# Patient Record
Sex: Female | Born: 1960 | Race: White | Hispanic: No | Smoking: Current every day smoker
Health system: Southern US, Community
[De-identification: ages and names within clinical notes are randomized; demographics above are authoritative.]

## PROBLEM LIST (undated history)

## (undated) ENCOUNTER — Ambulatory Visit: Admission: EM | Source: Home / Self Care

## (undated) DIAGNOSIS — M069 Rheumatoid arthritis, unspecified: Secondary | ICD-10-CM

## (undated) DIAGNOSIS — F319 Bipolar disorder, unspecified: Secondary | ICD-10-CM

## (undated) DIAGNOSIS — G8929 Other chronic pain: Secondary | ICD-10-CM

## (undated) DIAGNOSIS — E039 Hypothyroidism, unspecified: Secondary | ICD-10-CM

## (undated) DIAGNOSIS — M545 Low back pain, unspecified: Secondary | ICD-10-CM

## (undated) DIAGNOSIS — M797 Fibromyalgia: Secondary | ICD-10-CM

## (undated) DIAGNOSIS — T7840XA Allergy, unspecified, initial encounter: Secondary | ICD-10-CM

## (undated) DIAGNOSIS — K219 Gastro-esophageal reflux disease without esophagitis: Secondary | ICD-10-CM

## (undated) DIAGNOSIS — E785 Hyperlipidemia, unspecified: Secondary | ICD-10-CM

## (undated) DIAGNOSIS — K602 Anal fissure, unspecified: Secondary | ICD-10-CM

## (undated) DIAGNOSIS — D649 Anemia, unspecified: Secondary | ICD-10-CM

## (undated) DIAGNOSIS — S62109A Fracture of unspecified carpal bone, unspecified wrist, initial encounter for closed fracture: Secondary | ICD-10-CM

## (undated) DIAGNOSIS — F1910 Other psychoactive substance abuse, uncomplicated: Secondary | ICD-10-CM

## (undated) DIAGNOSIS — I309 Acute pericarditis, unspecified: Secondary | ICD-10-CM

## (undated) DIAGNOSIS — F419 Anxiety disorder, unspecified: Secondary | ICD-10-CM

## (undated) HISTORY — PX: BACK SURGERY: SHX140

## (undated) HISTORY — DX: Other psychoactive substance abuse, uncomplicated: F19.10

## (undated) HISTORY — DX: Anxiety disorder, unspecified: F41.9

## (undated) HISTORY — PX: TOTAL SHOULDER ARTHROPLASTY: SHX126

## (undated) HISTORY — DX: Gastro-esophageal reflux disease without esophagitis: K21.9

## (undated) HISTORY — DX: Hyperlipidemia, unspecified: E78.5

## (undated) HISTORY — DX: Anal fissure, unspecified: K60.2

## (undated) HISTORY — DX: Anemia, unspecified: D64.9

## (undated) HISTORY — DX: Other chronic pain: G89.29

## (undated) HISTORY — DX: Bipolar disorder, unspecified: F31.9

## (undated) HISTORY — PX: SHOULDER ARTHROSCOPY W/ ROTATOR CUFF REPAIR: SHX2400

## (undated) HISTORY — PX: COLONOSCOPY: SHX174

## (undated) HISTORY — DX: Hypothyroidism, unspecified: E03.9

## (undated) HISTORY — DX: Allergy, unspecified, initial encounter: T78.40XA

## (undated) HISTORY — PX: TONSILLECTOMY: SUR1361

## (undated) HISTORY — DX: Rheumatoid arthritis, unspecified: M06.9

---

## 1997-11-09 ENCOUNTER — Inpatient Hospital Stay (HOSPITAL_COMMUNITY): Admission: AD | Admit: 1997-11-09 | Discharge: 1997-11-09 | Payer: Self-pay | Admitting: Obstetrics and Gynecology

## 1998-02-05 ENCOUNTER — Ambulatory Visit (HOSPITAL_COMMUNITY): Admission: RE | Admit: 1998-02-05 | Discharge: 1998-02-05 | Payer: Self-pay | Admitting: Obstetrics and Gynecology

## 1998-03-05 ENCOUNTER — Observation Stay (HOSPITAL_COMMUNITY): Admission: RE | Admit: 1998-03-05 | Discharge: 1998-03-06 | Payer: Self-pay | Admitting: Obstetrics and Gynecology

## 1998-03-12 ENCOUNTER — Inpatient Hospital Stay (HOSPITAL_COMMUNITY): Admission: AD | Admit: 1998-03-12 | Discharge: 1998-03-12 | Payer: Self-pay | Admitting: Obstetrics and Gynecology

## 1998-03-19 ENCOUNTER — Ambulatory Visit (HOSPITAL_COMMUNITY): Admission: RE | Admit: 1998-03-19 | Discharge: 1998-03-19 | Payer: Self-pay

## 1998-04-14 ENCOUNTER — Ambulatory Visit (HOSPITAL_COMMUNITY): Admission: RE | Admit: 1998-04-14 | Discharge: 1998-04-14 | Payer: Self-pay | Admitting: Obstetrics and Gynecology

## 1998-04-28 ENCOUNTER — Inpatient Hospital Stay (HOSPITAL_COMMUNITY): Admission: AD | Admit: 1998-04-28 | Discharge: 1998-04-28 | Payer: Self-pay | Admitting: Obstetrics and Gynecology

## 1998-05-05 ENCOUNTER — Inpatient Hospital Stay (HOSPITAL_COMMUNITY): Admission: AD | Admit: 1998-05-05 | Discharge: 1998-05-05 | Payer: Self-pay | Admitting: Obstetrics and Gynecology

## 1998-05-06 ENCOUNTER — Inpatient Hospital Stay (HOSPITAL_COMMUNITY): Admission: AD | Admit: 1998-05-06 | Discharge: 1998-05-08 | Payer: Self-pay | Admitting: Obstetrics and Gynecology

## 1998-05-06 ENCOUNTER — Encounter: Payer: Self-pay | Admitting: Obstetrics and Gynecology

## 1998-05-09 ENCOUNTER — Inpatient Hospital Stay (HOSPITAL_COMMUNITY): Admission: AD | Admit: 1998-05-09 | Discharge: 1998-05-14 | Payer: Self-pay | Admitting: Obstetrics and Gynecology

## 1998-05-10 ENCOUNTER — Encounter: Payer: Self-pay | Admitting: Obstetrics and Gynecology

## 1999-05-18 HISTORY — PX: GASTRIC BYPASS: SHX52

## 1999-07-02 ENCOUNTER — Other Ambulatory Visit: Admission: RE | Admit: 1999-07-02 | Discharge: 1999-07-02 | Payer: Self-pay | Admitting: Obstetrics and Gynecology

## 1999-07-03 ENCOUNTER — Other Ambulatory Visit: Admission: RE | Admit: 1999-07-03 | Discharge: 1999-07-03 | Payer: Self-pay | Admitting: Obstetrics and Gynecology

## 1999-07-03 ENCOUNTER — Encounter (INDEPENDENT_AMBULATORY_CARE_PROVIDER_SITE_OTHER): Payer: Self-pay

## 2000-11-04 ENCOUNTER — Encounter: Payer: Self-pay | Admitting: Emergency Medicine

## 2000-11-04 ENCOUNTER — Emergency Department (HOSPITAL_COMMUNITY): Admission: EM | Admit: 2000-11-04 | Discharge: 2000-11-04 | Payer: Self-pay | Admitting: Emergency Medicine

## 2001-05-02 ENCOUNTER — Encounter: Admission: RE | Admit: 2001-05-02 | Discharge: 2001-06-01 | Payer: Self-pay | Admitting: Family Medicine

## 2002-01-25 ENCOUNTER — Encounter: Payer: Self-pay | Admitting: Emergency Medicine

## 2002-01-25 ENCOUNTER — Emergency Department (HOSPITAL_COMMUNITY): Admission: EM | Admit: 2002-01-25 | Discharge: 2002-01-25 | Payer: Self-pay | Admitting: Emergency Medicine

## 2002-04-30 ENCOUNTER — Emergency Department (HOSPITAL_COMMUNITY): Admission: EM | Admit: 2002-04-30 | Discharge: 2002-04-30 | Payer: Self-pay | Admitting: Emergency Medicine

## 2002-04-30 ENCOUNTER — Encounter: Payer: Self-pay | Admitting: Emergency Medicine

## 2002-05-09 ENCOUNTER — Encounter (INDEPENDENT_AMBULATORY_CARE_PROVIDER_SITE_OTHER): Payer: Self-pay | Admitting: Specialist

## 2002-05-09 ENCOUNTER — Ambulatory Visit (HOSPITAL_COMMUNITY): Admission: RE | Admit: 2002-05-09 | Discharge: 2002-05-09 | Payer: Self-pay | Admitting: *Deleted

## 2002-05-21 ENCOUNTER — Ambulatory Visit (HOSPITAL_COMMUNITY): Admission: RE | Admit: 2002-05-21 | Discharge: 2002-05-21 | Payer: Self-pay | Admitting: *Deleted

## 2002-05-21 ENCOUNTER — Encounter: Payer: Self-pay | Admitting: *Deleted

## 2002-09-06 ENCOUNTER — Encounter: Payer: Self-pay | Admitting: Emergency Medicine

## 2002-09-06 ENCOUNTER — Ambulatory Visit (HOSPITAL_COMMUNITY): Admission: EM | Admit: 2002-09-06 | Discharge: 2002-09-07 | Payer: Self-pay | Admitting: Emergency Medicine

## 2004-02-24 ENCOUNTER — Encounter: Admission: RE | Admit: 2004-02-24 | Discharge: 2004-02-24 | Payer: Self-pay | Admitting: Internal Medicine

## 2004-03-17 HISTORY — PX: ABDOMINAL HYSTERECTOMY: SHX81

## 2004-03-24 ENCOUNTER — Encounter (INDEPENDENT_AMBULATORY_CARE_PROVIDER_SITE_OTHER): Payer: Self-pay | Admitting: *Deleted

## 2004-03-24 ENCOUNTER — Inpatient Hospital Stay (HOSPITAL_COMMUNITY): Admission: RE | Admit: 2004-03-24 | Discharge: 2004-03-25 | Payer: Self-pay | Admitting: Obstetrics and Gynecology

## 2004-11-20 ENCOUNTER — Emergency Department (HOSPITAL_COMMUNITY): Admission: EM | Admit: 2004-11-20 | Discharge: 2004-11-21 | Payer: Self-pay | Admitting: Emergency Medicine

## 2004-11-21 ENCOUNTER — Inpatient Hospital Stay (HOSPITAL_COMMUNITY): Admission: EM | Admit: 2004-11-21 | Discharge: 2004-11-24 | Payer: Self-pay | Admitting: Psychiatry

## 2004-11-21 ENCOUNTER — Ambulatory Visit: Payer: Self-pay | Admitting: Psychiatry

## 2006-12-04 ENCOUNTER — Emergency Department (HOSPITAL_COMMUNITY): Admission: EM | Admit: 2006-12-04 | Discharge: 2006-12-04 | Payer: Self-pay | Admitting: Emergency Medicine

## 2008-03-12 ENCOUNTER — Emergency Department (HOSPITAL_COMMUNITY): Admission: EM | Admit: 2008-03-12 | Discharge: 2008-03-12 | Payer: Self-pay | Admitting: Emergency Medicine

## 2008-08-01 ENCOUNTER — Emergency Department (HOSPITAL_COMMUNITY): Admission: EM | Admit: 2008-08-01 | Discharge: 2008-08-01 | Payer: Self-pay | Admitting: Emergency Medicine

## 2008-12-05 ENCOUNTER — Observation Stay (HOSPITAL_COMMUNITY): Admission: EM | Admit: 2008-12-05 | Discharge: 2008-12-06 | Payer: Self-pay | Admitting: Emergency Medicine

## 2008-12-11 ENCOUNTER — Other Ambulatory Visit (HOSPITAL_COMMUNITY): Admission: RE | Admit: 2008-12-11 | Discharge: 2009-01-09 | Payer: Self-pay | Admitting: Psychiatry

## 2008-12-12 ENCOUNTER — Ambulatory Visit: Payer: Self-pay | Admitting: Psychiatry

## 2009-05-01 ENCOUNTER — Encounter: Admission: RE | Admit: 2009-05-01 | Discharge: 2009-05-01 | Payer: Self-pay | Admitting: Obstetrics and Gynecology

## 2009-06-07 ENCOUNTER — Emergency Department (HOSPITAL_COMMUNITY): Admission: EM | Admit: 2009-06-07 | Discharge: 2009-06-07 | Payer: Self-pay | Admitting: Emergency Medicine

## 2010-06-07 ENCOUNTER — Encounter: Payer: Self-pay | Admitting: Sports Medicine

## 2010-06-07 ENCOUNTER — Encounter: Payer: Self-pay | Admitting: Family Medicine

## 2010-06-07 ENCOUNTER — Encounter: Payer: Self-pay | Admitting: Obstetrics and Gynecology

## 2010-08-22 LAB — OPIATE, QUANTITATIVE, URINE
Hydrocodone: 1690 ng/mL
Hydromorphone GC/MS Conf: NEGATIVE ng/mL
Morphine, Confirm: NEGATIVE ng/mL
Oxycodone, ur: NEGATIVE ng/mL
Oxymorphone: NEGATIVE ng/mL

## 2010-08-22 LAB — URINE DRUGS OF ABUSE SCREEN W ALC, ROUTINE (REF LAB)
Barbiturate Quant, Ur: NEGATIVE
Cocaine Metabolites: NEGATIVE
Ethyl Alcohol: <10 mg/dL (ref <10)
Methadone: NEGATIVE
Propoxyphene: NEGATIVE

## 2010-08-23 LAB — COMPREHENSIVE METABOLIC PANEL
AST: 30 U/L (ref 0–37)
Albumin: 3.9 g/dL (ref 3.5–5.2)
BUN: 14 mg/dL (ref 6–23)
Chloride: 105 mEq/L (ref 96–112)
Sodium: 140 mEq/L (ref 135–145)

## 2010-08-23 LAB — URINE CULTURE

## 2010-08-23 LAB — RAPID URINE DRUG SCREEN, HOSP PERFORMED
Amphetamines: NOT DETECTED
Benzodiazepines: POSITIVE — AB
Opiates: NOT DETECTED
Tetrahydrocannabinol: NOT DETECTED

## 2010-08-23 LAB — BASIC METABOLIC PANEL
Chloride: 108 mEq/L (ref 96–112)
GFR calc Af Amer: >60 mL/min (ref >60)
Glucose, Bld: 92 mg/dL (ref 70–99)
Potassium: 3.7 mEq/L (ref 3.5–5.1)
Sodium: 138 mEq/L (ref 135–145)

## 2010-08-23 LAB — CBC
Hemoglobin: 10.9 g/dL — ABNORMAL LOW (ref 12.0–15.0)
Platelets: 219 10*3/uL (ref 150–400)
RBC: 3.33 MIL/uL — ABNORMAL LOW (ref 3.87–5.11)
WBC: 6.4 10*3/uL (ref 4.0–10.5)

## 2010-08-23 LAB — ETHANOL: Alcohol, Ethyl (B): <5 mg/dL (ref 0–10)

## 2010-08-23 LAB — URINALYSIS, ROUTINE W REFLEX MICROSCOPIC
Glucose, UA: >1000 mg/dL — AB
Nitrite: NEGATIVE
pH: 5.5 (ref 5.0–8.0)

## 2010-08-23 LAB — ACETAMINOPHEN LEVEL: Acetaminophen (Tylenol), Serum: <10 ug/mL — ABNORMAL LOW (ref 10–30)

## 2010-08-23 LAB — URINE MICROSCOPIC-ADD ON

## 2010-09-29 NOTE — Discharge Summary (Signed)
[name], [name]                 ACCOUNT NO.:  [id number]   MEDICAL RECORD NO.:  [id number]          PATIENT TYPE:  OBV   LOCATION:  1443                         FACILITY:  [facility]   PHYSICIAN:  Zannie Cove, MD     DATE OF BIRTH:  Aug 14, 1960   DATE OF ADMISSION:  12/05/2008  DATE OF DISCHARGE:  12/06/2008                               DISCHARGE SUMMARY   PRIMARY CARE PHYSICIAN:  Pasty Spillers. Ivory Broad, MD at [facility].   DISCHARGE DIAGNOSIS:  Altered mental status secondary to drug overdose,  resolved.   CONSULTATIONS:  Psychiatry, Behavioral Health, Geoffery Lyons, M.D.,  pending.   SECONDARY DIAGNOSES:  1. Altered mental status secondary to drug overdose.  2. Depression.  3. Fibromyalgia.  4. Chronic pain syndrome.   DISCHARGE MEDICATIONS:  May be changed, but are as follows:  1. Lyrica 150 mg b.i.d.  2. Xanax 1 mg b.i.d.  3. Prozac 40 mg once a day.  4. Vicodin 5/325 p.o. t.i.d. p.r.n. for pain.   HOSPITAL COURSE:  For admission details, please see the note dictated by  Dr. Ardyth Harps on December 05, 2008.  Briefly, Ms. Beechy was admitted with  altered mental status secondary to multidrug overdose.  She was  monitored clinically and gradually her mental status improved as the  medication affects started wearing down.  She is currently stable, still  with a sitter at the bedside, awaiting a psych consultation.  The  patient will be discharged once cleared by psychiatry.   DISCHARGE DIET:  Regular.   DISCHARGE ACTIVITY:  As tolerated.   FOLLOW UP:  The patient is advised to follow up with primary care  physician, Dr. Ivory Broad in 1-2 weeks.      Zannie Cove, MD  Electronically Signed    PJ/MEDQ  D:  12/06/2008  T:  12/06/2008  Job:  [id number]   cc:   Nolon Nations, MD

## 2010-09-29 NOTE — Consult Note (Signed)
NAMEOSIE, MERKIN NO.:  [id number]   MEDICAL RECORD NO.:  [id number]          PATIENT TYPE:  REC   LOCATION:  CIOP                          FACILITY:  [facility]   PHYSICIAN:  [name], M.D.  DATE OF BIRTH:  1961-03-23   DATE OF CONSULTATION:  12/06/2008  DATE OF DISCHARGE:                                 CONSULTATION   REASON FOR CONSULTATION:  Anxiety, alcohol dependence.   HISTORY OF PRESENT ILLNESS:  [name] is a 50 year old female  admitted to the [facility] on December 05, 2008, due to an  overdose.   The patient denies having any memory of overdosing, and she has a  blackout for the time between she was sitting on her back porch the day  before the overdose and waking up in the hospital.  She was found not  breathing in her garage, and there were empty bottles present including  Xanax and Lyrica bottles.   The patient describes constructive future goals and interests.  She  denies any thoughts of harming herself or others.  She has no impairment  in orientation.  She is alert.  Her memory function is normal.  She is  cooperative.   She does acknowledge that she took extra medication that morning.  She  acknowledges that she took 2 mg of Xanax that morning instead of her  normal dosage of 1 mg.  She also acknowledges that she took two of her  Lyrica 150 mg tablets that morning instead of her normal single tablet.  She also acknowledges that she took two Vicodin tablets that morning  instead of her normal one Vicodin tablet.  She acknowledges that this is  medication abuse.  She states that she had some extra pain that morning  and deliberately took extra medicine to numb herself.  However, again,  she denies any suicidal thoughts.   PAST PSYCHIATRIC HISTORY:  She did have a psychiatric admission in 2006  for an overdose.  Most recently her normal prescription medication  includes Xanax 1 mg t.i.d. for anxiety and Prozac 40 mg daily  for  antidepression antianxiety.   Regarding her past substance abuse and dependence.  She does acknowledge  that she has been relapsing on alcohol drinking two beers every 3 days.  She also describes a history of heavy year drinking when she would drink  between applying in a fifth of liquor per day for a month at a time.   She has been to the [facility] and other chemical dependency residential  rehabilitation programs in the past.   She denies any history of racing thoughts, increased energy or decreased  need for sleep.  She does state that she has been on lithium in the past  as well as Risperdal, Lamictal and Seroquel.  She does describe a long-  term history of feeling on edge, excessive worry, muscle tension and  insomnia.   She has undergone treatment for alcohol withdrawal in the past in  addition to a residential rehabilitation.   In review of the past medical record regarding the  2006 overdose, her  presentation at that time was similar to the current history of  overdose.  The patient denies any history of suicide attempts.  During  the 2006 [facility] admission, she was treated with the  Librium protocol and diagnosed with polysubstance abuse induced  psychosis.  They listed rule out bipolar disorder.  Her discharge  medications at that time included Seroquel 100 mg q.h.s. Geodon 80 mg  b.i.d. and Paxil 20 mg daily   SOCIAL HISTORY:  [name] and did not complete high school.  She has a  54 year old daughter and a 38 year old son.  She cites them as reasons  why she would never commit suicide.  She has undergone divorce three  times.  She is currently living in a house with two of her children.  She denies any illegal drugs.   FAMILY PSYCHIATRIC HISTORY:  None known.   PAST MEDICAL HISTORY:  Status post polysubstance overdose history of  gastric bypass.   ALLERGIES:  No drug allergies.   MEDICATIONS:  MAR is reviewed.  She is not currently on any  psychotropic  medications.   LABORATORY DATA:  Sodium 138, BUN 5, creatinine 0.74, glucose 92.  WBC  6.4, hemoglobin 10.9, platelet count 219, alcohol was negative.  SGOT  30, SGPT 25, Tylenol negative.  Drug screen was positive for  benzodiazepines.   REVIEW OF SYSTEMS:  Constitutional, Head, Eyes, Ears, Nose And Throat,  Mouth, Neurologic, Psychiatric, Cardiovascular, Respiratory,  Gastrointestinal, Genitourinary, Skin, Musculoskeletal, Hematologic,  Lymphatic, Endocrine, Metabolic all unremarkable except for additional  social:  [name] father did not receive any information from the  undersigned regarding [name]'s case.  However, he did tell the  undersigned when he approached the nursing station about the several  rehabilitation programs that [name] had been through only to have  relapse again.   PHYSICAL EXAMINATION:  VITAL SIGNS:  Temperature 97.7, pulse 82,  respiratory rate 18, blood pressure 131/82, O2 saturation on room air  98%.  GENERAL APPEARANCE:  [name] is a middle-aged female appearing her  chronologic age sitting up in her hospital bed with no abnormal  involuntary movements.   MENTAL STATUS EXAM:  [name] is alert.  Her eye contact is good.  Her  attention span is normal.  Her affect is mildly anxious mood is mildly  anxious.  Concentration within normal limits.  She is oriented  completely to all spheres.  Her memory is intact for immediate recent  and remote except for the overdose blackout.  Her fund of knowledge and  intelligence are normal.  Her speech involves normal rate and prosody  without dysarthria.   Thought process logical, coherent, goal-directed.  No looseness of  associations.  Thought content:  No thoughts of harming herself or  others.  No delusions or hallucinations.  Insight is partial.  She does  understand that she has a severe problem with addiction.  However, she  is not motivated to attend additional intensive  inpatient  rehabilitation.  Her judgment is intact.   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  AXIS II:  Deferred.  AXIS III:  See past medical history.  AXIS IV:  Primary support group.  AXIS V:  55.   Ms. Febo is not at risk to harm herself or others.  She agrees to call  emergency services immediately for any thoughts of harming herself,  thoughts  of harming others or distress.   The undersigned recommended that she undergo inpatient chemical  dependence treatment.  This could include a sedative hypnotic detox  protocol such as an Ativan protocol.  She could benefit from a  residential programs involving relapse prevention skills as well as  additional coping skills and stress management.   However, the patient declines inpatient treatment and she is no longer  committable now that she is recovered from her intoxicated state.   She does want to continue her Prozac for antianxiety.   However, she understands that the undersigned does not advise continuing  Xanax given her history of severe abuse and risk of relapse.   She also understands the potential risk of withdrawal.   At this time, she would be willing to attend a chemical dependency  intensive outpatient program.   Would restart her Prozac and continue to recommend the inpatient  treatment as above.  However, as mentioned, she is not committable and  does have the capacity for informed consent.   She does have the capacity to leave the hospital against medical advice.      [name], M.D.  Electronically Signed     JW/MEDQ  D:  12/20/2008  T:  12/21/2008  Job:  [id number]

## 2010-09-29 NOTE — H&P (Signed)
NAMETONETTE, KOEHNE NO.:  [id number]   MEDICAL RECORD NO.:  [id number]          PATIENT TYPE:  EMS   LOCATION:  ED                           [facility]ACILITY[facility]  [facility]   PHYSICIAN:  Peggye Pitt, M.D. DATE OF BIRTH:  [birth year]   DATE OF ADMISSION:  12/05/2008  DATE OF DISCHARGE:                              HISTORY & PHYSICAL   The patient's primary care physician is Dr. Nelson Chimes in [place].   CHIEF COMPLAINT:  Drug overdose.   HISTORY OF PRESENT ILLNESS:  Mrs. Crumrine is a 50 year old Caucasian  female who apparently was found in the garage not breathing with a  positive pulse.  A man who lives at the house with her called EMS.  Apparently they bagged her and gave her Narcan and she responded. It  appears the gentleman who lives with her had oxycodone filled on December 02, 2008 that had about 15 pills left that is now empty.  EMS also found  a bottle of Xanax and Lyrica that were empty, and there is a question if  she may have overdosed on these as well.   Upon my examination the patient is very lethargic.  She opens her eyes  briefly. I ask for a question, and she falls right back asleep.  Hence I  am not able to obtain any history. I was able to get out of though that  she was not actually trying to kill herself.  Instead she was in a lot  of pain with her fibromyalgia and just wanted to feel better.   ALLERGIES:  She has no documented allergies.   PAST MEDICAL HISTORY:  Unobtainable at this time secondary to her  altered mental status.   MEDICATIONS:  I have not been able to obtain this from the patient  directly but from bottles brought in by EMS it would appear she is on  Lyrica 150 twice daily as well as Xanax 1 mg twice daily.   SOCIAL HISTORY:  Unobtainable as well as review of systems secondary to  her mental status.   REVIEW OF SYSTEMS:  Unobtainable.   PHYSICAL EXAM:  VITAL SIGNS:  Upon admission blood pressure 98/59, heart  rate 105, respirations  12, O2 sats 97% on room air with a temperature  97.5.  GENERAL:  She is very somnolent, opens her eyes to voice.  However,  falls quickly back to sleep. Her speech is very slurred.  HEENT: She is normocephalic, atraumatic.  Her pupils are equal and  reactive to light and accommodation.  I have been unable to evaluate  extraocular movements.  NECK:  Supple.  No JVD, no lymphadenopathy, no bruits or goiter.  HEART:  Regular rate and rhythm with no murmurs, rubs or gallops.  LUNGS: Clear to auscultation bilaterally.  ABDOMEN:  Soft, nontender, nondistended with positive bowel sounds.  EXTREMITIES:  She has no edema, positive pedal pulses.   LABS UPON ADMISSION:  Sodium 140, potassium 4.4, chloride 105, bicarb  26, BUN 14, creatinine 1.13 with a glucose of 161.  All of her LFTs are  within normal  limits.  Urine drug screen is only positive for  benzodiazepine. A urinalysis is negative. Alcohol and Tylenol levels are  negative.  She had a chest x-ray that shows cardiomegaly with mild  vascular congestion.   She had an EKG that showed normal sinus rhythm with no acute T  prolongation or ST changes.   ASSESSMENT AND PLAN:  1. A Multidrug overdose, including Xanax and possibly Lyrica.      Currently the patient is very lethargic.  Apparently she had a      respiratory arrest on the scene and required bagging. However, she      has not been intubated. She is satting well on room air.  For now      will admit to step-down unit for observation purposes,  suicide      precautions, as well as a sitter at bedside. I will obtain a      psychiatric consultation.  For prophylaxis while in the hospital      she will be on Protonix for GI prophylaxis and Lovenox for DVT      prophylaxis.      Peggye Pitt, M.D.  Electronically Signed     EH/MEDQ  D:  12/05/2008  T:  12/05/2008  Job:  [id number]   cc:   Nelson Chimes, MD  [facility]

## 2010-10-02 NOTE — Discharge Summary (Signed)
[name], Shelley NO.:  [id number]   MEDICAL RECORD NO.:  [id number]          PATIENT TYPE:  IPS   LOCATION:  0508                          FACILITY:  [facility]   PHYSICIAN:  Geoffery Lyons, M.D.      DATE OF BIRTH:  [birth year]   DATE OF ADMISSION:  11/21/2004  DATE OF DISCHARGE:  11/24/2004                                 DISCHARGE SUMMARY   CHIEF COMPLAINT AND PRESENT ILLNESS:  This was the first admission to [facility] for this 50 year old divorced white female  involuntarily committed. EMS was called to come to the patient's home the  night before. They stated that patient had back pain. Upon arrival, the  patient was noted to be lethargic. Her speech was slurred. The patient's  sister gave EMS a Xanax bottle with approximately 28.5 missing. She endorsed  having drunk alcohol the night before and taking Xanax during the day.  Claimed it was not a suicide attempt. She was lethargic, but she aroused to  voice. She is was cooperative. Followed commands. In the ER, she was noted  to be having some outbursts of nonsensical speech, and she was also holding  a conversation with an imaginary person. Treated with Geodon IM 20 with good  response.   PAST PSYCHIATRIC HISTORY:  [place] in [place]. Seven years and five years ago  was in [place] for alcohol dependency.   MEDICAL HISTORY:  Gastric bypass.   MEDICATIONS:  1.  Xanax 0.5 three times a day.  2.  Paxil 10 mg a day.   PHYSICAL EXAMINATION:  Physical exam performed but did not show any acute  findings.   LABORATORY DATA:  Blood chemistries:  TSH 6.162. SGOT 37, SGPT 20. Drug  screen positive for benzodiazepines. CBC:  White blood cells 10.2,  hemoglobin 14.6.   MENTAL STATUS EXAMINATION:  Reveals an alert, cooperative female, casually  groomed and dressed, adequately nourished. Speech was not pressured but at  times will not answer appropriately. Mood, she claims to be good. Affect was  hyperthymic. Thought processes were not clear or logical. She could be  rational or goal oriented for a short period of time. Judgment and insight  were poor. She denied any suicidal or homicidal ideations. Noted that she  had heard voices in the past.   ADMISSION DIAGNOSES:  AXIS I:  Rule out substance-induced psychosis. Alcohol  and benzodiazepine abuse. Rule out bipolar disorder.  AXIS II:  No diagnosis.  AXIS III:  Status post gastric bypass.  AXIS IV:  Moderate.  AXIS V:  Upon admission 25 to 30, highest Global Assessment of Functioning  in the last year 60.   COURSE IN THE HOSPITAL:  She was admitted and started in individual and  group psychotherapy. She was given Ambien for sleep, Seroquel 100 at night  and 50 every 6 hours as needed for anxiety or agitation. She was detoxified  with Librium. She required the Geodon 20 mg IM a one time dose, and then she  was placed on Paxil 20 mg per day. She endorsed that  she had been using  alcohol and Xanax. Endorsed persistent anxiety. She was on Paxil and wanting  to go back on it. Endorsed she was committed to abstinence. She already had  a bed to go to [facility] July 17. Initially admitted psychotic. As the  hospitalization progressed, she was in full contact with reality, a little  bit anxious and shaky. July 10, she was better, committed to abstinence, was  going to [facility]. Slept better the night before. Wanted to pursue  medications. Endorsed she had a lot of things to do. On July 11, she was in  full contact with reality. There were no suicidal ideas. No homicidal ideas.  No hallucinations. No delusions. Said she was ready to go. She was going to  go to [facility] July 17. Was committed to abstinence. She had some  arrangements to make before she was ready to go to [facility].   DISCHARGE DIAGNOSES:  AXIS I:  Alcohol dependence. Xanax and benzodiazepine  abuse. Substance-induced psychosis. Anxiety disorder  not otherwise  specified.  AXIS II:  No diagnosis.  AXIS III:  Status post gastric bypass.  AXIS IV:  Moderate.  AXIS V:  50.   DISCHARGE MEDICATIONS:  1.  Seroquel 100 at night.  2.  Geodon 80 one in the morning, one in the afternoon.  3.  Paxil 20 mg per day.   Recommendations to simplify her medication regimen, possibly starting with  the Seroquel, to go to [facility] on July 17.      Geoffery Lyons, M.D.  Electronically Signed     IL/MEDQ  D:  12/22/2004  T:  12/23/2004  Job:  [id number]

## 2010-10-02 NOTE — Discharge Summary (Signed)
NAME:  [name], [name]                           ACCOUNT NO.:  [id number]   MEDICAL RECORD NO.:  [id number]                   PATIENT TYPE:  INP   LOCATION:  3708                                 FACILITY:  [facility]   PHYSICIAN:  [name], M.D.         DATE OF BIRTH:  1961-01-23   DATE OF ADMISSION:  09/06/2002  DATE OF DISCHARGE:  09/07/2002                                 DISCHARGE SUMMARY   CHIEF COMPLAINT:  Palpitation, chest pressure, and later chest pain.   DISCHARGE DIAGNOSES:  1. New incident ventricular tachycardia per telemetry at fire station prior     to arrival to the emergency room.  Emergency room telemetry unremarkable.     Complaints of chest pain/pressure.     a. Normal cardiac enzymes x 3, normal magnesium, normal thyroid-        stimulating hormones.     b. Status post cardiology consult, Dr. Pearletha Martinez. [name], request no        in-hospital interventions.  Follow up with Dr. Pearletha Martinez. [name],        Sep 24, 2002 postdischarge.  Also Cardiolite/stress test postdischarge        at [facility] and V[facility] office Sep 21, 2002.     c. Placed on beta blocker and is tolerating at the point of discharge.     d. Lipid panel obtained and this was pending at the time of discharge.  2. Status post stomach bypass surgery, previous upper gastrointestinal     series and esophagogram indicated hiatal hernia and gastroesophageal     reflux disease.     a. We will place on proton pump inhibitor and follow up with Dr. Georgiana Martinez, gastroenterology p.r.n.     b. Previous upper endoscopy with biopsy, question Barrett's esophagus.        Per biopsy result and pathology, January 26, 2002, noted benign        gastroesophageal junction mucosa with positive inflammatory changes        but no metaplasia.  3. History of arthritis, questionable osteoarthritis followed by Dr. Lemmie Martinez, [facility].  No problems with this over  the     course of the hospitalization.  4. Anxiety disorder using Klonopin 0.5 mg on a p.r.n. basis.   PAST SURGICAL HISTORY:  Status post cesarean section x 2 and status post  tubal ligation.   ALLERGIES:  No known drug allergies.   DISCHARGE MEDICATIONS:  1. Protonix 40 mg 1 q.d.  2. Metoprolol 25 mg b.i.d.  3. Paxil 10  mg q.d.  4. Klonopin 0.5 mg t.i.d. p.r.n.   DIET:  Status post distant gastric bypass surgery six to eight small meals  daily.  Would reflux would avoid high acid foods.   FOLLOW UP:  1. Follow up with Dr. Pearletha Martinez. [name] Sep 24, 2002.  2. Cardiolite stress test at [facility] and V[facility] Sep 17, 2002.  3. Follow up with Dr. Teena Martinez. [name] at [facility] one to     two weeks postdischarge.   PROCEDURE:  None.   CONSULTATIONS:  [name], M.D., [facility] and V[facility].   DISCHARGE LABORATORY DATA:  Cardiac enzymes x 3 were unremarkable.  Serum  magnesium was 1.9 and serum TSH 1.393.  A metabolic panel September 06, 2002  showed a sodium of 141, potassium 3.8, chloride 109, CO2 27, BUN 11,  creatinine 0.8.  Albumin low at 3.3.  A CBC done on September 06, 2002 found WBC  8.8, hemoglobin 10.7, hematocrit 30.6, platelets 343,000.   A 12-lead EKG noted normal sinus rhythm.   HISTORY OF PRESENT ILLNESS:  Per admission history of present illness,  family history, social history, review of systems, admission medications,  laboratory data, impressions and plan, admission examination, please see  dictated admission history and physical from September 06, 2002.   HOSPITAL COURSE:  Problem 1:  NEW ONSET VENTRICULAR TACHYCARDIA:  This 50-  year-old female was followed as an outpatient at [facility]  by Dr. Teena Martinez. [name].  She was working at her desk and felt her heart  racing.  She felt some chest tightness.  She denied diaphoresis.  There was  no radiation to the left arm or jaw.  She waited approximately 10  minutes  and when the symptomatology did not relieve itself she went to a local fire  station.  She reported that she was placed on telemetry and found to have a  pulse of 240.  She was given Adenosine and sublingual nitroglycerin as well  as aspirin and sent to [facility] Emergency Room.   A 12-lead EKG in the emergency room indicated normal sinus rhythm.  She did  complain of some chest pain rated 3/10.  She has known history of stomach  bypass.  Further details below under problem #2 but essentially there is a  known hiatal hernia and reflux diagnosed previously by upper GI series and  esophagogram.   She was seen in cardiology consult by Dr. Pearletha Martinez. [name] of  [facility] and Vascular.  The patient has a known smoking continuing  to smoke approximately one-half pack daily.  There was a family history of  myocardial infarction in paternal grandparents at age 35 resulting in death.  Cardiac enzymes x 3 were obtained and these were all negative.  Serum TSH  and magnesium level were checked and these were normal.  Per cardiology  consult did not feel any in-hospital interventions were required.  She was  started on a beta blocker by cardiology and has tolerated this to the point  of discharge.  She is to follow up with Dr. Pearletha Martinez. [name]  postdischarge as above.  She will also have a Cardiolite stress test  postdischarge at [facility] and V[facility].   Problem 2:  STATUS POST STOMACH BYPASS SURGERY:  This was done approximately  three years ago in [place].  Late last year, the patient had some problems  with reflux/vomiting and a sensation of things becoming stuck in her  esophagus.  She was seen in GI consult by Dr. Georgiana Martinez.  He performed  an upper GI series and esophagogram both indicating a hiatal hernia and  GERD.  She had an upper endoscopy done by Dr. Melrose Martinez. [name] suggesting a possible Barrett's esophagus.  I followed up on the  biopsy pathology and  this does in fact not suggest a Barrett's.  She is placed on proton pump  inhibitor and this will continue postdischarge.  She was asked to continue  with her six to eight small meals daily.  I have asked her to avoid acid  foods such as citrus juice, etc.  Follow up with Dr. Melrose Martinez. [name] on a  p.r.n. basis postdischarge.   Problem 3:  HISTORY OF ARTHRITIS:  This was followed by Dr. Lemmie Martinez and was not a problem over the course of hospitalization.   Problem 4:  MODERATE ANEMIA:  Admitting hemoglobin was 10.7.  This was not  aggressively investigated.  No indication of acute GI bleed.  Most likely  thought secondary to menstruation.  Would suggest follow-up CBC at some  point postdischarge at [facility].   Problem 5:  ANXIETY DISORDER:  The patient does have a known  anxiety  disorder taking p.r.n. Klonopin.  This was thought as a possible contributor  to ventricular tachycardia as well as reflux/esophageal spasm.  She will  continue on her p.r.n. Klonopin postdischarge.   CONDITION ON DISCHARGE:  Stable/improved.   DISPOSITION:  Returning home where she lives independently.  Activity is no  restrictions.  Follow-up as above.  Discharge process greater than 30  minutes, 11:15 a.m. through 12:15 p.m.     Everett Graff, N.P.                     [name], M.D.    TC/MEDQ  D:  09/07/2002  T:  09/09/2002  Job:  [id number]   cc:   [name]. [name], M.D.  [address]  [place]  [place] [address]  Fax: [phone]   [name], M.D.  [phone], [address]  [place]  [place] [address]  Fax: [phone]

## 2010-10-02 NOTE — Op Note (Signed)
   NAME:  [name], [name]                           ACCOUNT NO.:  [id number]   MEDICAL RECORD NO.:  [id number]                   PATIENT TYPE:  AMB   LOCATION:  ENDO                                 FACILITY:  [facility]   PHYSICIAN:  Georgiana Spinner, M.D.                 DATE OF BIRTH:  1960-10-19   DATE OF PROCEDURE:  DATE OF DISCHARGE:                                 OPERATIVE REPORT   PROCEDURE:  Upper endoscopy with biopsy.   INDICATIONS:  Vomiting.   ANESTHESIA:  Demerol 80, Versed 8 mg.   DESCRIPTION OF PROCEDURE:  With the patient mildly sedated in the left  lateral decubitus position, the Olympus videoscopic endoscope was inserted  into the mouth and passed under direct vision through the esophagus which  appeared normal except for a questionable area of Barrett's which was  photographed and biopsied.  We entered into the stomach sac, and this  appeared very healthy.  To the right, the remainder of the stomach had been  sewn off and could not be entered.  The opening to the bowel, however, the  gastrotomy site, was widely patent and healthy appearing.  We advanced  through this easily into a saccular structure of the bowel which appeared  normal as well, and the  remainder of the bowel was easily entered and was  normal appearing.  The endoscope was then withdrawn taking circumferential  views of the small bowel and gastric mucosa until the endoscope had been  pulled back all the way.  The patient's vital signs and pulse oximeter  remained stable.  The patient tolerated the procedure well with no apparent  complications.   FINDINGS:  Widely patent examination.   PLAN:  Will get barium swallow and upper GI to evaluate further and have  patient follow up with me.                                               Georgiana Spinner, M.D.    GMO/MEDQ  D:  05/09/2002  T:  05/10/2002  Job:  [id number]

## 2010-10-02 NOTE — Op Note (Signed)
[name], [name]                 ACCOUNT NO.:  [id number]   MEDICAL RECORD NO.:  [id number]          PATIENT TYPE:  INP   LOCATION:  0002                         FACILITY:  [facility]   PHYSICIAN:  Malachi Pro. Ambrose Mantle, M.D. DATE OF BIRTH:  08/05/1960   DATE OF PROCEDURE:  03/24/2004  DATE OF DISCHARGE:                                 OPERATIVE REPORT   PREOPERATIVE DIAGNOSIS:  Menorrhagia, dysmenorrhea, anemia, dyspareunia.   POSTOPERATIVE DIAGNOSIS:  Menorrhagia, dysmenorrhea, anemia, dyspareunia.   OPERATION:  Abdominal hysterectomy, lysis of adhesions around the left  ovary.   OPERATOR:  Malachi Pro. Ambrose Mantle, M.D.   ASSISTANT:  Zenaida Niece, M.D.   ANESTHESIA:  General anesthesia.   PROCEDURE NOTE:  The patient was brought to the operating room and placed  under satisfactory general anesthesia. The abdomen, vulva, vagina, and  urethra were prepped with Betadine solution. The bladder was cauterized with  a Foley and hooked to straight drain. The patient was then placed supine,  and the abdomen was draped as a sterile field. The patient had had 2  previous Cesarean sections, but incision was quite low on the pubis. I  elected to use an incision higher up on the abdominal wall. This incision  was carried in layers through the very thick subcutaneous tissue. The fascia  and then the rectus muscle was already slightly split in the midline, and  the peritoneum was opened vertically. I was able to examine the upper  abdomen somewhat. The liver was smooth. Both kidneys felt normal. I did not  feel the gallbladder. Exploration of the pelvis revealed the posterior cul-  de-sac to be free of any endometriosis. The uterus was somewhat enlarged,  maybe 1-1/4 times enlarged. The anterior cul-de-sac had some flimsy  peritoneum adhesions. The right tube and ovary appeared normal except for  the previous tubal ligation. The left tube and ovary also had signs of  previous tubal ligation, but  there were some peri-ovarian adhesions. The  ______________ retractors were used to prepare the operative field. Both  round ligaments were divided with the Bovie. The bladder flap was developed.  I then divided the utero-ovarian ligaments between clamps, doubly suture  ligated the right. In order to get good access to the left utero-ovarian  ligament, I needed to divide the adhesions surrounding the ovary, and this  allowed better application of a clamp between the uterus and the left tube  and ovary. I was careful to preserve the proximal aspects of the fallopian  tubes with the uterine specimen. I then skeletonized both uterine vessels,  clamped, cut, and suture ligated them. Divided the parauterine and  paracervical tissues and sutured them. Pushed the bladder well inferior to  the operative field with a combination of sharp and blunt dissection.  Clamped and cut both uterosacral ligaments. Held them for future reference  and then removed the uterine specimen by transecting the upper vagina.  Vaginal angle sutures were placed, and then the central portion of the  vagina was closed with interrupted figure-of-eight sutures of 0 Vicryl.  Hemostasis was achieved with a  combination of electrical current and  suturing with 3-0 Vicryl. Both ureters were inspected and found to be  normal. The uterosacral ligaments were sutured together in the midline.  Reperitonealization was done across the vaginal cuff. There was no bleeding.  Packs and retractors were removed. The peritoneum was closed with a running  suture of 0 Vicryl. There was some bleeding from the vessel on the right  rectus muscle, and this was sutured with 3-0 Vicryl with good hemostasis. I  elected not to reapproximate the rectums muscles because they seemed to  bleed so  freely. I then closed the fascia with 2 running sutures of 0 Vicryl, the  subcu with a running 3-0 Vicryl, and the skin was closed automatic staples.  Blood loss  was estimated at 200 cc. Sponge and needle counts were correct,  and the patient was returned to recovery in satisfactory condition.      TFH/MEDQ  D:  03/24/2004  T:  03/24/2004  Job:  [id number]

## 2010-10-02 NOTE — Discharge Summary (Signed)
NAMEJENNEFER, [name]                 ACCOUNT NO.:  [id number]   MEDICAL RECORD NO.:  [id number]          PATIENT TYPE:  INP   LOCATION:  9313                          FACILITY:  [facility]   PHYSICIAN:  Malachi Pro. Ambrose Mantle, M.D. DATE OF BIRTH:  04-01-1961   DATE OF ADMISSION:  03/24/2004  DATE OF DISCHARGE:  03/25/2004                                 DISCHARGE SUMMARY   HISTORY OF PRESENT ILLNESS:  The patient is a 50 year old white female  admitted for hysterectomy because of menorrhagia, dysmenorrhea, anemia, and  dyspareunia.  The patient underwent abdominal hysterectomy by Dr. Ambrose Mantle  with Dr. Jackelyn Knife assisting under general anesthesia on March 24, 2004.  Blood loss was about 200 cc.   HOSPITAL COURSE:  Because of a bed crunch at [facility], she was  transferred from the recovery room to [facility].  She has done well  postoperatively.  She is ambulating well without difficulty, voiding well,  tolerating a regular diet, and has passed flatus.  Her abdomen is soft and  nontender, and her incision is healing well.  During the day of March 25, 2004, the patient began itching.  Initially she thought it might be  secondary to the morphine.  That was stopped, and she was switched to  Percocet.  She continued to itch, and she thinks now that it is due to the  bed sheets.  In fact, for the last hour or so she has been sitting in a  chair and she is not itching as much.  She wants to be discharged, and there  is no contraindication to discharge.   LABORATORY DATA:  Initial hemoglobin 11.1, hematocrit 33, white count 7400,  platelet count 294,000, 69 segments, 23 lymphs, 7 monos, 1 eosinophil, and  no basophils.  Followup hematocrits postoperatively were 34.2 and 32.0.  Comprehensive metabolic profile was normal, except for a glucose of 150, but  this was taken one and a half hours postprandial; and an albumin of 3.4.  Urinalysis was negative.   STUDIES:  Chest x-ray showed  no evidence of acute disease.  This was done on  September 07, 2002.  EKG showed a normal sinus rhythm with short PR interval but  otherwise was normal.  A Cardiolite study in May of 2004 showed a small area  of attenuation in the left anterior descending artery which was very minimal  and may be related to the breasts.  Bruce protocol exercise stress test was  done on Sep 17, 2002 and was negative.  Pathology report is not available.   FINAL DIAGNOSES:  1.  Menorrhagia.  2.  Dysmenorrhea.  3.  Dyspareunia.  4.  History of anemia.  5.  Current anemia.   OPERATION:  Abdominal hysterectomy.   FINAL CONDITION:  Improved.   DIET:  Regular.   DISCHARGE INSTRUCTIONS:  No vaginal entrance.  No heavy lifting or strenuous  activity.  Call with any fever above 100.4 degrees.  Call with any heavy  vaginal bleeding.   DISCHARGE MEDICATIONS:  Percocet 5/325, 24 tablets, one every four to six  hours as needed for pain.   FOLLOW UP:  The patient is advised to return in two days to have her staples  removed and strips applied.      TFH/MEDQ  D:  03/25/2004  T:  03/26/2004  Job:  [id number]

## 2010-10-02 NOTE — H&P (Signed)
NAMEJENNALYN, [name] NO.:  [id number]   MEDICAL RECORD NO.:  [id number]          PATIENT TYPE:  IPS   LOCATION:  0406                          FACILITY:  [facility]   PHYSICIAN:  [name], M.D.      DATE OF BIRTH:  1960/10/11   DATE OF ADMISSION:  11/21/2004  DATE OF DISCHARGE:                         PSYCHIATRIC ADMISSION ASSESSMENT   This is a 50 year old divorced white female involuntarily admitted to the  services of Dr. Geoffery Martinez.   IDENTIFYING STATEMENT:  Apparently EMS was called to come to the patient's  home last night.  They stated that the patient had back pain.  Upon arrival  the patient was noted to be lethargic.  Her speech was slurred.  The  patient's sister gave EMS a Xanax bottle with approximately 28, 0.5-mg  tablets missing.  The patient acknowledged having drunk alcohol the night  before and taking Xanax during the day Friday.  She denied that it was a  suicide attempt.  She was lethargic, but she roused to voice.  She was  cooperative and followed commands.  She was oriented and in no acute  distress at the time of transfer.  Once in the emergency room she was noted  to be having some outbursts of nonsensical speech, and she was also holding  a conversation with an imaginary person.  She was treated with Geodon IM 20  mg with a good response.  She was evaluated by the ACT team, and it was  decided that she would benefit from hospitalization at the [facility].  Interestingly enough family members reported that she was scheduled  to be admitted to [facility] on November 30, 2004 for alcohol abuse.   PAST PSYCHIATRIC HISTORY:  The patient seems to have been admitted to  [facility] in 1983 and approximately 7 years and 5 years ago to the [facility];  all of this was for withdrawal from alcohol.   SOCIAL HISTORY:  She finished the 10th grade.  She states she has been  married and divorced three time.  She has two biological children, a  son age  54 and a 45-1/2-year-old son.  She denies any family history for mental  illness.  She states that she began drinking when she was about 12.  She  does smoke cigarettes.  She states that she binges every 2-3 weeks on  alcohol.   PRIMARY CARE [name]:  [name].   MEDICAL HISTORY:  She is status post two C-sections and a gastric bypass.  She also states in the past she has been anemic.   CURRENT MEDICATIONS:  1.  Xanax 0.5 mg t.i.d.  2.  Paxil 10 mg a day.  This was verified at [facility] [phone].   DRUG ALLERGIES:  No known drug allergies.   MENTAL STATUS EXAM:  She was alert and oriented for a few minutes.  Her  appearance is casually groomed.  She had to be encouraged to re-clothe.  She  appears to be adequately nourished.  Her speech was not pressured at the  time of the  interview, but she does not answer appropriately at times.  Her  mood was good.  Her affect was happy.  Her thought processes were no clear.  She could be rational and goal oriented, but it did not stay.  Judgment and  insight are poor.  Concentration and memory varied.  Her intelligence is at  least average.  At the time of the interview she denied being suicidal or  homicidal.  She denied auditory or visual hallucinations at that time.  She  did acknowledge that she has heard voices in the past, although she could  not tell exactly what they were saying.   ADMISSION DIAGNOSES:   AXIS I:  1.  Polysubstance abuse induced psychosis.  2.  Rule out bipolar disorder.   AXIS II:  Deferred.   AXIS III:  Status post gastric bypass.   AXIS IV:  None known.   AXIS V:  30.   PLAN:  Admit for detoxification.  Towards that end we will put her on the  low-dose Librium protocol.  She was treated with Geodon in the emergency  room with a good response, and we will continue that at the moment to help  evaluate whether she has underlying bipolar disorder and to treat her  current  psychosis.       MD/MEDQ  D:  11/21/2004  T:  11/21/2004  Job:  [id number]

## 2010-10-02 NOTE — H&P (Signed)
[name], [name]                 ACCOUNT NO.:  [id number]   MEDICAL RECORD NO.:  [id number]          PATIENT TYPE:  INP   LOCATION:  NA                           FACILITY:  [facility]   PHYSICIAN:  Malachi Pro. Ambrose Mantle, M.D. DATE OF BIRTH:  [birth year]   DATE OF ADMISSION:  03/24/2004  DATE OF DISCHARGE:                                HISTORY & PHYSICAL   HISTORY OF PRESENT ILLNESS:  A 50 year old white female, para 0-2-9-2, who  was admitted to the hospital for abdominal hysterectomy, possible bilateral  salpingo-oophorectomy because of menorrhagia, dysmenorrhea, history of  anemia, and dyspareunia.  Last menstrual period was March 12, 2004,  lasting five days.  The patient's periods usually occur regularly, last 3 to  7 days, are associated with heavy bleeding;  using 8 pads a day with quart  sized clots.  She rates her pain with her menstrual periods as 10/10, and  she claims to have pain with sexual intercourse.  When she was examined in  June 2005, she was noted to be anemic, she also had tenderness of the  uterus, and a possibly thickened cul-de-sac.  She opted to proceed with  hysterectomy and she is admitted for that procedure now.   ALLERGIES:  No known drug allergies.   PAST SURGICAL HISTORY:  1.  Cervical cerclage.  2.  Two cesarean sections.  3.  Gastric bypass procedure.  4.  Tonsillectomy.  5.  Tubal ligation.  6.  Removal of ectopic pregnancy.   PAST MEDICAL HISTORY:  No heart problems.  She does drink and smoke  occasionally.   REVIEW OF SYSTEMS:  Negative.  The patient does report decreased sex drive  secondary to Paxil, imperfect relationship, and pain with intercourse.  At  age 83, she desired sex 3 to 4 times a week, now rarely has the desire.   FAMILY HISTORY:  Mother and father both 74, living and well.  One sister is  living and well.  No brothers.   PHYSICAL EXAMINATION:  GENERAL:  A well-developed, somewhat obese, white  female.  VITAL SIGNS:  201  pounds, blood pressure 110/74, pulse 80.  HEENT:  No cranial abnormalities.  Extraocular movements were intact.  Nose  and pharynx clear.  NECK:  Supple without thyromegaly.  HEART:  Normal size and sounds.  No murmurs.  LUNGS:  Clear to auscultation and percussion.  ABDOMEN:  Soft, flat, nontender.  There is an upper midline scar.  PELVIC:  Pap smear on October 23, 2003, was within normal limits.  The vulva and  vagina are clean.  Cervix is clean.  Uterus is mid plane, normal size, it is  tender to motion.  Adnexa are clear.  RECTAL:  Negative.   ADMITTING IMPRESSION:  1.  Menorrhagia.  2.  Dysmenorrhea.  3.  Dyspareunia.  4.  History of anemia to 9.6 g in June 2005, that has subsequently been      built up to 12 g.   PLAN:  The patient is admitted for abdominal hysterectomy.  Removal of the  tubes and ovaries will depend on  the pathology found at surgery.  If there  is no pelvic pathology, then just a hysterectomy will be done.  The patient  has been counseled about the risks of surgery, including, but not limited to  heart attack, stroke, pulmonary embolism, wound disruption, hemorrhage, need  for re-operation, and/or transfusion, fistula formation, nerve injury, and  intestinal obstruction.  She understands and agrees to proceed.      TFH/MEDQ  D:  03/23/2004  T:  03/23/2004  Job:  [id number]

## 2010-10-02 NOTE — H&P (Signed)
NAME:  [name], [name]                           ACCOUNT NO.:  [id number]   MEDICAL RECORD NO.:  [id number]                   PATIENT TYPE:  INP   LOCATION:  3708                                 FACILITY:  [facility]   PHYSICIAN:  Rosanne Sack, M.D.         DATE OF BIRTH:  Nov 09, 1960   DATE OF ADMISSION:  09/06/2002  DATE OF DISCHARGE:                                HISTORY & PHYSICAL   CHIEF COMPLAINT:  Palpitation, chest pressure and later syncopal chest pain.   PROBLEM LIST:  1. New-onset ventricular tachycardia, rate reported 240 per minute.     a. After reporting to a fire department building, she received adenosine,        sublingual nitroglycerin, aspirin prior to transport to [facility] Emergency Room.     b. No previous cardiac history, but does use tobacco, one-half pack        daily.  Mild-to-moderate alcohol use.  History of obesity, status post        stomach bypass surgery approximately three years ago.  Paternal        grandfather expired, myocardial infarction, age 25.  2. Status post stomach bypass approximately three years ago by Dr. Derrel Nip     in [place].     a. Status post gastrointestinal consult locally with Dr. Melrose Nakayama. [place]        and upper endoscopy done, May 10, 2002, noting ? Barrett's        esophagus with biopsy obtained, unsure of results.     b. History of left flank/abdominal pain with CT scan of the abdomen and        pelvis done, January 25, 2002, noting no indication of renal        calculi, postoperative changes secondary to gastric bypass.     c. Esophogram done April 30, 2002 noted hiatal hernia and        gastroesophageal reflux disease.  No obstruction.  Again, prior        partial gastrectomy with gastrojejunal anastomosis.     d. Upper gastrointestinal series, May 21, 2002, noted mild        gastroesophageal reflux disease and prompt emptying into the        gastrojejunostomy.  3. History of arthritis, ?  osteoarthritis, followed by Dr. Lemmie Evens     at [facility].  4. Anxiety disorder, taking Klonopin 0.5 mg p.r.n.  5. Surgical history -- status post cesarean section x2, status post tubal     ligation.  6. Allergies listed -- no known drug allergies.   HISTORY OF PRESENT ILLNESS:  This 50 year old female is followed by Dr.  Teena Irani. Arlyce Dice at [facility].  She was sitting at her desk  at work today and felt a sensation of her heart racing and palpitation.  There was some chest tightness but no  radiation.  She denied diaphoresis.  She waited approximately 10 minutes and when the symptoms did not relieve,  she went to a local fire station.  Per her report, a telemetry unit found  her pulse to be 240.  She was given adenosine and reportedly converted to  sinus rhythm.  She was given sublingual nitroglycerin and aspirin with  relief of her chest tightness.  She was then transported to [facility]  Emergency Room for further evaluation and treatment.  In the emergency room,  her 12-lead EKG still indicates normal sinus rhythm.  She does complain of  some central chest pain without radiation, rated 3/10.   FAMILY HISTORY:  Father is living with a history of  osteoarthritis/osteoporosis, status post hip replacement.  Mother is living  with a recent cholecystectomy surgery.  No family history of diabetes or  hypertension noted.  The paternal grandfather died of an MI, age 38.  She  has a sister reported in good health.   SOCIAL HISTORY:  Patient in the process of divorcing.  She has two children.  Does smoke tobacco, life-long adult years, approximately one-half pack  daily.  Occasional alcohol use.  No illicit drugs.   REVIEW OF SYSTEMS:  HEENT:  Eyes:  No visual changes.  Ears:  No changes in  hearing acuity.  LUNGS:  No shortness of breath, orthopnea, productive cough  or sputum.  HEART:  As indicated in the history of present illness,   palpitations and chest tightness with later chest pain, central chest.  Has  no history of hypertension.  No previous history of MI or known coronary  artery disease.  Has not seen a cardiologist previously.  ABDOMEN:  Status  post bypass surgery done approximately three years ago in [place].  She has  had GI followup locally with Dr. Sabino Gasser with some complaints consistent  with an esophageal stricture.  A GI series and esophogram as well as upper  endoscopy were done with results as above.  Does not currently complain of  nausea or vomiting.  No abnormal stooling.  MUSCULOSKELETAL:  Has a history  of arthritis, unclear type, she believes osteoarthritis, followed by Dr.  Jimmy Footman, [facility].  NEUROLOGIC:  No history of  seizure or stroke.  ENDOCRINE:  No history of diabetes or thyroid disorder.  INTEGUMENTARY:  No history of nonhealing ulcers or skin cancers.   MEDICATIONS AT TIME OF ADMISSION:  Klonopin 0.5 mg as needed for anxiety.   PHYSICAL EXAMINATION:  VITAL SIGNS:  Temperature 97.6, pulse 100,  respirations 24, blood pressure 117/68.  O2 saturations 99-100%.  GENERAL:  Well-developed middle-aged female in no acute distress.  She is  alert, cooperative, oriented.  HEENT:  Head normocephalic.  Eyes:  PERRLA.  Ear canals clear and hearing  grossly normal.  Nose:  Nares patent without masses or discharge noted.  Oral mucosa pink and moist.  Good dental hygiene.  NECK:  No jugular venous distention or bruits noted.  HEART:  Rate and rhythm regular without murmur, S3, S4.  PMI located  normally anatomically.  LUNGS:  Breath sounds clear and equal.  ABDOMEN:  Abdomen soft and round with positive bowel sounds in all four  quadrants.  Mild diffuse pain, really unable to localize this.  URINARY/GENITAL:  No bladder pain or CVA tenderness.  Genital exam deferred  as noncontributory.  RECTAL:  Exam deferred as noncontributory. MUSCULOSKELETAL:  Range of motion and  strength full, 5/5.  NEUROLOGIC:  Cranial nerves  II-XII grossly intact.  No unilateral or focal  deficits.  INTEGUMENTARY:  Skin warm and dry.  No ulcers or lesions observed, though a  full-body exam was not conducted.   LABORATORY DATA:  Twelve-lead EKG shows normal sinus rhythm.   Chest x-ray noted no active disease.   Cardiac enzymes:  CK 177, MB 1.6, relative index 0.9, troponin 0.02.  Metabolic panel:  Sodium 141, potassium 3.8, chloride 109, CO2 27, BUN 11,  and creatinine 0.8.  CBC found WBC 8.8, hemoglobin low at 10.7, hematocrit  low at 30.6, platelets 343,000.  Differential was unremarkable.   IMPRESSIONS AND PLAN:  1. New-onset ventricular tachycardia with central chest pain and no     radiation.  Per telemetry at fire station, rate described as 240 prior to     adenosine.  Rhythm strip was not sent.  Will admit to telemetry bed and     follow with a full three sets of cardiac enzymes.  Serum thyroid-     stimulating hormone and magnesium to be checked.  Two-dimensional     echocardiogram with [facility] to read.  No beta     blockers for now due to possibility of Cardiolite.  Proton pump inhibitor     will be started twice daily.  Discontinue nasal cannula oxygen and check     O2 saturations for stability.  Cardiac consult for [facility]     [facility].  2. Status post stomach bypass with upper gastrointestinal series and     esophogram all indicating hiatal hernia and gastroesophageal reflux     disease.  Again, proton pump inhibitor twice daily.  Diet will be six to     eight small meals daily, status post gastric bypass.  Check magnesium     level.  3. Normocytic anemia.  ? Status post menstruation.  Check stool for occult     blood.  4. Tobacco use.  Smoking cessation recommended.    DISPOSITION:  1. Telemetry bed, service of Dr. Nolon Rod Monguilod, [phone].  Condition     on admission stable.  2. Activity:  Up as tolerated.   3. Diet:  Six to eight small meals daily, status post gastric bypass.  4. Vitals:  Routine per facility protocol.     Everett Graff, N.P.                     Rosanne Sack, M.D.    TC/MEDQ  D:  09/06/2002  T:  09/07/2002  Job:  [id number]

## 2010-10-06 ENCOUNTER — Emergency Department (INDEPENDENT_AMBULATORY_CARE_PROVIDER_SITE_OTHER): Payer: BC Managed Care – PPO

## 2010-10-06 ENCOUNTER — Emergency Department (HOSPITAL_BASED_OUTPATIENT_CLINIC_OR_DEPARTMENT_OTHER)
Admission: EM | Admit: 2010-10-06 | Discharge: 2010-10-06 | Disposition: A | Discharge status: Home / Self Care | Payer: BC Managed Care – PPO | Attending: Emergency Medicine | Admitting: Emergency Medicine

## 2010-10-06 DIAGNOSIS — S9030XA Contusion of unspecified foot, initial encounter: Secondary | ICD-10-CM

## 2010-10-06 DIAGNOSIS — Y92009 Unspecified place in unspecified non-institutional (private) residence as the place of occurrence of the external cause: Secondary | ICD-10-CM | POA: Insufficient documentation

## 2010-10-06 DIAGNOSIS — IMO0001 Reserved for inherently not codable concepts without codable children: Secondary | ICD-10-CM | POA: Insufficient documentation

## 2010-10-06 DIAGNOSIS — Z8739 Personal history of other diseases of the musculoskeletal system and connective tissue: Secondary | ICD-10-CM | POA: Insufficient documentation

## 2010-10-06 DIAGNOSIS — W2203XA Walked into furniture, initial encounter: Secondary | ICD-10-CM | POA: Insufficient documentation

## 2010-10-06 DIAGNOSIS — F17200 Nicotine dependence, unspecified, uncomplicated: Secondary | ICD-10-CM | POA: Insufficient documentation

## 2010-10-06 DIAGNOSIS — W2209XA Striking against other stationary object, initial encounter: Secondary | ICD-10-CM

## 2011-05-18 HISTORY — PX: TARSAL METATARSAL FUSION WITH WEIL OSTEOTOMY: SHX5676

## 2011-09-17 ENCOUNTER — Other Ambulatory Visit: Payer: Self-pay | Admitting: Podiatry

## 2011-09-17 DIAGNOSIS — M79672 Pain in left foot: Secondary | ICD-10-CM

## 2011-09-22 ENCOUNTER — Ambulatory Visit
Admission: RE | Admit: 2011-09-22 | Discharge: 2011-09-22 | Disposition: A | Discharge status: Home / Self Care | Payer: BC Managed Care – PPO | Source: Ambulatory Visit | Attending: Podiatry | Admitting: Podiatry

## 2011-09-22 DIAGNOSIS — M79672 Pain in left foot: Secondary | ICD-10-CM

## 2012-03-31 ENCOUNTER — Ambulatory Visit (INDEPENDENT_AMBULATORY_CARE_PROVIDER_SITE_OTHER): Payer: BC Managed Care – PPO | Admitting: Family Medicine

## 2012-03-31 VITALS — BP 122/80 | HR 98 | Temp 98.3°F | Resp 16 | Ht 65.5 in | Wt 189.2 lb

## 2012-03-31 DIAGNOSIS — R51 Headache: Secondary | ICD-10-CM

## 2012-03-31 DIAGNOSIS — M797 Fibromyalgia: Secondary | ICD-10-CM | POA: Insufficient documentation

## 2012-03-31 DIAGNOSIS — F319 Bipolar disorder, unspecified: Secondary | ICD-10-CM

## 2012-03-31 DIAGNOSIS — F1120 Opioid dependence, uncomplicated: Secondary | ICD-10-CM

## 2012-03-31 DIAGNOSIS — F1121 Opioid dependence, in remission: Secondary | ICD-10-CM | POA: Insufficient documentation

## 2012-03-31 DIAGNOSIS — IMO0001 Reserved for inherently not codable concepts without codable children: Secondary | ICD-10-CM

## 2012-03-31 DIAGNOSIS — M791 Myalgia, unspecified site: Secondary | ICD-10-CM

## 2012-03-31 DIAGNOSIS — F1920 Other psychoactive substance dependence, uncomplicated: Secondary | ICD-10-CM

## 2012-03-31 DIAGNOSIS — F1310 Sedative, hypnotic or anxiolytic abuse, uncomplicated: Secondary | ICD-10-CM

## 2012-03-31 NOTE — Patient Instructions (Signed)
(  pt left prior to AVS)

## 2012-03-31 NOTE — Progress Notes (Signed)
  Subjective:    Patient ID: [name], female    DOB: 1960/05/20, 51 y.o.   MRN: [id number]  HPI IN opiate withdrawal - on opiates for years. Had foot surgery in May.   Has been tapered off meds past month by Dr. Ivory Broad.  Reinjured foot last week - restarted vicodin then  10/325 - taking 2 at each time - ran out in 1 week. Off these meds since yesterday.  Feeling shaky, decreased appetite, runny nose, headache, myalgias.   Hx of panic attacks in past - took Klonopin then - years ago.  Did have rx for xanax when tapering off narcotics - 3 to 4 times per day. Last Rx atleast 1 month ago. Last taken Xanax few weeks ago - ran out of them. Was taking few pills few times per day. Later changed history to say it's been a few days - 2 days ago since xanax (admitted to this after discussed discrepancy on controlled substance database)  Would like to get clonidine and klonopin to help with the symptoms per her reading.   Other doctors: Tomasa Rand - Risperdal for Bipolar, Dr. Corliss Skains for fibromyalgia - on Lyrica.   Inpatient treatment for alcohol 2 years ago.  No recent alcohol.    Review of Systems  Musculoskeletal: Positive for back pain.  Neurological: Positive for tremors and headaches. Negative for seizures.       Objective:   Physical Exam  Constitutional: She is oriented to person, place, and time. She appears well-developed and well-nourished.  HENT:  Head: Normocephalic and atraumatic.  Pulmonary/Chest: Effort normal and breath sounds normal.  Neurological: She is alert and oriented to person, place, and time.  Skin: Skin is warm.  Psychiatric: She has a normal mood and affect. Her behavior is normal.   No tremor noted on exam.  No acute distress.      Assessment & Plan:  [name] is a 51 y.o. female 1. Narcotic addiction   2. Benzodiazepine abuse   3. Headache   4. Myalgia    Controlled substance database reviewed. #30 of hydrocodone 10/325 filled on 03/27/12, and #90  of alprazolam 1mg  on 03/18/12.  Admitted on further discussion on recent overuse of both the hydrocodone and alprazolam and now off alprazolam for past few days. Discussed with her the concern of benzodiazepine withdrawal likely concurrent to narcotic withdrawal, and risks of BDZ withdrawal, including but not limited to seizure. Understanding expressed.  Spoke with ACT team through Behavioral health to determine options for treatment.  Recommended [facility] for eval for inpatient treatment, and discussed likely medical clearance given recent overuse of meds above. Patient aware and plan to go to [facility] ER for eval.

## 2012-04-11 ENCOUNTER — Ambulatory Visit (INDEPENDENT_AMBULATORY_CARE_PROVIDER_SITE_OTHER): Payer: BC Managed Care – PPO | Admitting: Internal Medicine

## 2012-04-11 VITALS — BP 116/84 | HR 79 | Temp 97.6°F | Resp 18 | Ht 65.0 in | Wt 183.0 lb

## 2012-04-11 DIAGNOSIS — Z719 Counseling, unspecified: Secondary | ICD-10-CM

## 2012-04-11 DIAGNOSIS — IMO0001 Reserved for inherently not codable concepts without codable children: Secondary | ICD-10-CM

## 2012-04-11 DIAGNOSIS — G894 Chronic pain syndrome: Secondary | ICD-10-CM

## 2012-04-11 DIAGNOSIS — M069 Rheumatoid arthritis, unspecified: Secondary | ICD-10-CM

## 2012-04-11 DIAGNOSIS — M797 Fibromyalgia: Secondary | ICD-10-CM

## 2012-04-11 MED ORDER — OXYCODONE HCL 10 MG PO TABS: 10.0000 mg | ORAL_TABLET | Freq: Two times a day (BID) | ORAL | Status: DC | PRN

## 2012-04-11 NOTE — Patient Instructions (Addendum)
Chronic Pain Chronic pain can be defined as pain that is lasting, off and on, and lasts for 3 to 6 months or longer. Many things cause chronic pain, which can make it difficult to make a discrete diagnosis. There are many treatment options available for chronic pain. However, finding a treatment that works well for you may require trying various approaches until a suitable one is found. CAUSES  In some types of chronic medical conditions, the pain is caused by a normal pain response within the body. A normal pain response helps the body identify illness or injury and prevent further damage from being done. In these cases, the cause of the pain may be identified and treated, even if it may not be cured completely. Examples of chronic conditions which can cause chronic pain include:  Inflammation of the joints (arthritis).  Back pain or neck pain (including bulging or herniated disks).  Migraine headaches.  Cancer. In some other types of chronic pain syndromes, the pain is caused by an abnormal pain response within the body. An abnormal pain response is present when there is no ongoing cause (or stimulus) for the pain, or when the cause of the pain is arising from the nerves or nervous system itself. Examples of conditions which can cause chronic pain due to an abnormal pain response include:  Fibromyalgia.  Reflex sympathetic dystrophy (RSD).  Neuropathy (when the nerves themselves are damaged, and may cause pain). DIAGNOSIS  Your caregiver will help diagnose your condition over time. In many cases, the initial focus will be on excluding conditions that could be causing the pain. Depending on your symptoms, your caregiver may order some tests to diagnose your condition. Some of these tests include:  Blood tests.  Computerized X-ray scans (CT scan).  Computerized magnetic scans (MRI).  X-rays.  Ultrasounds.  Nerve conduction studies.  Consultation with other physicians or  specialists. TREATMENT  There are many treatment options for people suffering from chronic pain. Finding a treatment that works well may take time.   You may be referred to a pain management specialist.  You may be put on medication to help with the pain. Unfortunately, some medications (such as opiate medications) may not be very effective in cases where chronic pain is due to abnormal pain responses. Finding the right medications can take some time.  Adjunctive therapies may be used to provide additional relief and improve a patient's quality of life. These therapies include:  Mindfulness meditation.  Acupuncture.  Biofeedback.  Cognitive-behavioral therapy.  In certain cases, surgical interventions may be attempted. HOME CARE INSTRUCTIONS   Make sure you understand these instructions prior to discharge.  Ask any questions and share any further concerns you have with your caregiver prior to discharge.  Take all medications as directed by your caregiver.  Keep all follow-up appointments. SEEK MEDICAL CARE IF:   Your pain gets worse.  You develop a new pain that was not present before.  You cannot tolerate any medications prescribed by your caregiver.  You develop new symptoms since your last visit with your caregiver. SEEK IMMEDIATE MEDICAL CARE IF:   You develop muscular weakness.  You have decreased sensation or numbness.  You lose control of bowel or bladder function.  Your pain suddenly gets much worse.  You have an oral temperature above 102 F (38.9 C), not controlled by medication.  You develop shaking chills, confusion, chest pain, or shortness of breath. Document Released: 01/23/2002 Document Revised: 07/26/2011 Document Reviewed: 05/01/2008 ExitCare Patient Information  [address], [place]. Osteoarthritis Osteoarthritis is the most common form of arthritis. It is redness, soreness, and swelling (inflammation) affecting the cartilage. Cartilage acts as  a cushion, covering the ends of bones where they meet to form a joint. CAUSES  Over time, the cartilage begins to wear away. This causes bone to rub on bone. This produces pain and stiffness in the affected joints. Factors that contribute to this problem are:  Excessive body weight.  Age.  Overuse of joints. SYMPTOMS   People with osteoarthritis usually experience joint pain, swelling, or stiffness.  Over time, the joint may lose its normal shape.  Small deposits of bone (osteophytes) may grow on the edges of the joint.  Bits of bone or cartilage can break off and float inside the joint space. This may cause more pain and damage.  Osteoarthritis can lead to depression, anxiety, feelings of helplessness, and limitations on daily activities. The most commonly affected joints are in the:  Ends of the fingers.  Thumbs.  Neck.  Lower back.  Knees.  Hips. DIAGNOSIS  Diagnosis is mostly based on your symptoms and exam. Tests may be helpful, including:  X-rays of the affected joint.  A computerized magnetic scan (MRI).  Blood tests to rule out other types of arthritis.  Joint fluid tests. This involves using a needle to draw fluid from the joint and examining the fluid under a microscope. TREATMENT  Goals of treatment are to control pain, improve joint function, maintain a normal body weight, and maintain a healthy lifestyle. Treatment approaches may include:  A prescribed exercise program with rest and joint relief.  Weight control with nutritional education.  Pain relief techniques such as:  Properly applied heat and cold.  Electric pulses delivered to nerve endings under the skin (transcutaneous electrical nerve stimulation, TENS).  Massage.  Certain supplements. Ask your caregiver before using any supplements, especially in combination with prescribed drugs.  Medicines to control pain, such as:  Acetaminophen.  Nonsteroidal anti-inflammatory drugs (NSAIDs),  such as naproxen.  Narcotic or central-acting agents, such as tramadol. This drug carries a risk of addiction and is generally prescribed for short-term use.  Corticosteroids. These can be given orally or as injection. This is a short-term treatment, not recommended for routine use.  Surgery to reposition the bones and relieve pain (osteotomy) or to remove loose pieces of bone and cartilage. Joint replacement may be needed in advanced states of osteoarthritis. HOME CARE INSTRUCTIONS  Your caregiver can recommend specific types of exercise. These may include:  Strengthening exercises. These are done to strengthen the muscles that support joints affected by arthritis. They can be performed with weights or with exercise bands to add resistance.  Aerobic activities. These are exercises, such as brisk walking or low-impact aerobics, that get your heart pumping. They can help keep your lungs and circulatory system in shape.  Range-of-motion activities. These keep your joints limber.  Balance and agility exercises. These help you maintain daily living skills. Learning about your condition and being actively involved in your care will help improve the course of your osteoarthritis. SEEK MEDICAL CARE IF:   You feel hot or your skin turns red.  You develop a rash in addition to your joint pain.  You have an oral temperature above 102 F (38.9 C). FOR MORE INFORMATION  National Institute of Arthritis and Musculoskeletal and Skin Diseases: www.niams.http://www.myers.net/ [employer] on Aging: https://walker.com/ American College of Rheumatology: www.rheumatology.org Document Released: 05/03/2005 Document Revised: 07/26/2011 Document Reviewed: 08/14/2009 ExitCare Patient  Information 2013 [place], [place].

## 2012-04-11 NOTE — Progress Notes (Signed)
  Subjective:    Patient ID: [name], female    DOB: 1961/03/19, 51 y.o.   MRN: [id number]  HPI Family friend of mine. Has a hx of substance abuse in past with recovery. Has had rheumatoid arthritis for over 14yrs now recently swithched to Dr. Titus Dubin for RA care. Has ra and osteoarthritis pain every day and would like referral to chronic pain center. Sed rate by her report running over 40 even on tx methotrexate.    Review of Systems     Objective:   Physical Exam Stable family care with Dr. Ivory Broad Rheumatology care with Dr. Eric Form today       Assessment & Plan:  Refer to chronic pain center asap Increase lyrica 200mg  bid/epocrates rev with her. Will temporarily help her with the chronic pain with short tem oxycodone rx.  To  tell her MDs about what we are doing.

## 2013-03-29 ENCOUNTER — Encounter: Payer: Self-pay | Admitting: Internal Medicine

## 2013-04-25 ENCOUNTER — Ambulatory Visit: Payer: BC Managed Care – PPO | Admitting: Internal Medicine

## 2013-05-23 ENCOUNTER — Ambulatory Visit: Payer: BC Managed Care – PPO | Admitting: Internal Medicine

## 2013-05-23 ENCOUNTER — Encounter: Payer: Self-pay | Admitting: Internal Medicine

## 2013-09-28 ENCOUNTER — Telehealth: Payer: Self-pay

## 2013-09-28 ENCOUNTER — Ambulatory Visit (INDEPENDENT_AMBULATORY_CARE_PROVIDER_SITE_OTHER): Payer: BC Managed Care – PPO | Admitting: Internal Medicine

## 2013-09-28 VITALS — BP 118/72 | HR 90 | Temp 98.3°F | Resp 16 | Ht 65.5 in | Wt 248.6 lb

## 2013-09-28 DIAGNOSIS — M5137 Other intervertebral disc degeneration, lumbosacral region: Secondary | ICD-10-CM

## 2013-09-28 DIAGNOSIS — M797 Fibromyalgia: Secondary | ICD-10-CM

## 2013-09-28 DIAGNOSIS — IMO0001 Reserved for inherently not codable concepts without codable children: Secondary | ICD-10-CM

## 2013-09-28 DIAGNOSIS — R51 Headache: Secondary | ICD-10-CM

## 2013-09-28 DIAGNOSIS — M5136 Other intervertebral disc degeneration, lumbar region: Secondary | ICD-10-CM

## 2013-09-28 LAB — POCT CBC
Granulocyte percent: 63.4 %G (ref 37–80)
HEMATOCRIT: 36.4 % — AB (ref 37.7–47.9)
Hemoglobin: 11.3 g/dL — AB (ref 12.2–16.2)
LYMPH, POC: 2.3 (ref 0.6–3.4)
MCH: 30.7 pg (ref 27–31.2)
MCHC: 31 g/dL — AB (ref 31.8–35.4)
MCV: 98.8 fL — AB (ref 80–97)
MID (cbc): 0.6 (ref 0–0.9)
MPV: 8.5 fL (ref 0–99.8)
POC GRANULOCYTE: 5 (ref 2–6.9)
POC LYMPH %: 29.2 % (ref 10–50)
POC MID %: 7.4 % (ref 0–12)
Platelet Count, POC: 364 10*3/uL (ref 142–424)
RBC: 3.68 M/uL — AB (ref 4.04–5.48)
RDW, POC: 14.9 %
WBC: 7.9 10*3/uL (ref 4.6–10.2)

## 2013-09-28 LAB — POCT SEDIMENTATION RATE: POCT SED RATE: 38 mm/h — AB (ref 0–22)

## 2013-09-28 MED ORDER — METHOCARBAMOL 750 MG PO TABS: 750.0000 mg | ORAL_TABLET | Freq: Four times a day (QID) | ORAL | Status: DC

## 2013-09-28 MED ORDER — OXYCODONE-ACETAMINOPHEN 10-325 MG PO TABS: 1.0000 | ORAL_TABLET | Freq: Three times a day (TID) | ORAL | Status: DC | PRN

## 2013-09-28 MED ORDER — NALBUPHINE HCL 20 MG/ML IJ SOLN
20.0000 mg | INTRAMUSCULAR | Status: DC | PRN
  Administered 2013-09-28: 20 mg via INTRAMUSCULAR

## 2013-09-28 NOTE — Telephone Encounter (Signed)
Heron Nay called to talk to doctor guest, would like him cal call [name] to discuss a possible visit.[phone]

## 2013-09-28 NOTE — Progress Notes (Signed)
Subjective:    Patient ID: [name], female    DOB: 16-Apr-1961, 53 y.o.   MRN: [id number]  HPI 53 YO female patient presents to the clinic today with complaints of headache that started about 4 days ago. She has a history fibromyalgia. She believes this is an attack. She has had attacks like this in the past. The pain radiates from her neck to her lumbar area. She states that her hips and ankles are hurting also. She points to her frontal lobe area as where the pain is. She reports her headache is an 8 out 10 with 10 being the worst pain. She has not had a headache associated with her attacks. Denies any weakness, numbness.   She explains that she hurt her back in December. She was given narcotics, muscle relaxer's and had steroid injections. She recovered. She thinks she re-injured her back about 2 weeks ago at home in her yard. She went to [facility] and received the same medications as she had in December. She is not feeling any relief. She thinks that that injury was the start of this attack.   No spine xray's in her chart. Last CT head was 2006. She states she has had an MRI of her back- [facility] Lumbar [facility] her orthopedist and Dr. Patrecia Pour her rheumatology and monitors her meds. Her meds were reviewed with me.   Review of Systems     Objective:   Physical Exam  Constitutional: She is oriented to person, place, and time. She appears well-developed and well-nourished.  HENT:  Head: Normocephalic.  Mouth/Throat: Oropharynx is clear and moist.  Eyes: EOM are normal. Pupils are equal, round, and reactive to light. No scleral icterus.  Neck: Normal range of motion. Neck supple.  Cardiovascular: Normal rate and normal heart sounds.   Pulmonary/Chest: Effort normal.  Abdominal: Soft. Bowel sounds are normal. There is no tenderness.  Musculoskeletal: She exhibits tenderness.       Thoracic back: She exhibits tenderness and pain. She exhibits normal range of motion, no bony  tenderness, no swelling, no edema, no deformity, no laceration and normal pulse.       Lumbar back: She exhibits tenderness, pain and spasm. She exhibits normal range of motion, no bony tenderness, no swelling, no edema, no deformity, no laceration and normal pulse.  Neurological: She is alert and oriented to person, place, and time. She exhibits normal muscle tone. Coordination normal.  Psychiatric: She has a normal mood and affect. Her behavior is normal. Judgment and thought content normal.     Results for orders placed in visit on 09/28/13  POCT CBC      Result Value Ref Range   WBC 7.9  4.6 - 10.2 K/uL   Lymph, poc 2.3  0.6 - 3.4   POC LYMPH PERCENT 29.2  10 - 50 %L   MID (cbc) 0.6  0 - 0.9   POC MID % 7.4  0 - 12 %M   POC Granulocyte 5.0  2 - 6.9   Granulocyte percent 63.4  37 - 80 %G   RBC 3.68 (*) 4.04 - 5.48 M/uL   Hemoglobin 11.3 (*) 12.2 - 16.2 g/dL   HCT, POC 36.4 (*) 37.7 - 47.9 %   MCV 98.8 (*) 80 - 97 fL   MCH, POC 30.7  27 - 31.2 pg   MCHC 31.0 (*) 31.8 - 35.4 g/dL   RDW, POC 14.9     Platelet Count, POC 364  142 - 424  K/uL   MPV 8.5  0 - 99.8 fL   Sed rate pending     Assessment & Plan:  Nubain 20mg  IM/Oxycodone 10mg  prn pain  Follow up with your doctors.

## 2013-09-28 NOTE — Patient Instructions (Signed)

## 2013-09-28 NOTE — Telephone Encounter (Signed)
Pt coming in today at 1pm to see Dr. Elder Cyphers.

## 2013-11-25 ENCOUNTER — Ambulatory Visit (INDEPENDENT_AMBULATORY_CARE_PROVIDER_SITE_OTHER): Payer: BC Managed Care – PPO | Admitting: Internal Medicine

## 2013-11-25 VITALS — BP 122/74 | HR 112 | Temp 98.0°F | Resp 17 | Ht 65.5 in | Wt 248.0 lb

## 2013-11-25 DIAGNOSIS — F17200 Nicotine dependence, unspecified, uncomplicated: Secondary | ICD-10-CM

## 2013-11-25 DIAGNOSIS — R358 Other polyuria: Secondary | ICD-10-CM

## 2013-11-25 DIAGNOSIS — R631 Polydipsia: Secondary | ICD-10-CM

## 2013-11-25 DIAGNOSIS — M545 Low back pain, unspecified: Secondary | ICD-10-CM

## 2013-11-25 DIAGNOSIS — E669 Obesity, unspecified: Secondary | ICD-10-CM

## 2013-11-25 DIAGNOSIS — M069 Rheumatoid arthritis, unspecified: Secondary | ICD-10-CM

## 2013-11-25 DIAGNOSIS — F17210 Nicotine dependence, cigarettes, uncomplicated: Secondary | ICD-10-CM

## 2013-11-25 DIAGNOSIS — A599 Trichomoniasis, unspecified: Secondary | ICD-10-CM

## 2013-11-25 DIAGNOSIS — G8929 Other chronic pain: Secondary | ICD-10-CM

## 2013-11-25 DIAGNOSIS — R3589 Other polyuria: Secondary | ICD-10-CM

## 2013-11-25 LAB — CBC WITH DIFFERENTIAL/PLATELET
Basophils Absolute: 0.1 10*3/uL (ref 0.0–0.1)
Basophils Relative: 1 % (ref 0–1)
EOS ABS: 0.1 10*3/uL (ref 0.0–0.7)
EOS PCT: 2 % (ref 0–5)
HEMATOCRIT: 35.9 % — AB (ref 36.0–46.0)
Hemoglobin: 12 g/dL (ref 12.0–15.0)
LYMPHS ABS: 1.4 10*3/uL (ref 0.7–4.0)
Lymphocytes Relative: 19 % (ref 12–46)
MCH: 30.6 pg (ref 26.0–34.0)
MCHC: 33.4 g/dL (ref 30.0–36.0)
MCV: 91.6 fL (ref 78.0–100.0)
MONO ABS: 0.6 10*3/uL (ref 0.1–1.0)
MONOS PCT: 9 % (ref 3–12)
Neutro Abs: 5 10*3/uL (ref 1.7–7.7)
Neutrophils Relative %: 69 % (ref 43–77)
PLATELETS: 316 10*3/uL (ref 150–400)
RBC: 3.92 MIL/uL (ref 3.87–5.11)
RDW: 14.8 % (ref 11.5–15.5)
WBC: 7.2 10*3/uL (ref 4.0–10.5)

## 2013-11-25 LAB — POCT URINALYSIS DIPSTICK
Bilirubin, UA: NEGATIVE
Glucose, UA: NEGATIVE
KETONES UA: NEGATIVE
NITRITE UA: NEGATIVE
PH UA: 5.5
Protein, UA: NEGATIVE
RBC UA: NEGATIVE
Spec Grav, UA: 1.01
Urobilinogen, UA: 0.2

## 2013-11-25 LAB — COMPREHENSIVE METABOLIC PANEL
ALT: 10 U/L (ref 0–35)
AST: 15 U/L (ref 0–37)
Albumin: 3.9 g/dL (ref 3.5–5.2)
Alkaline Phosphatase: 56 U/L (ref 39–117)
BILIRUBIN TOTAL: 0.3 mg/dL (ref 0.2–1.2)
BUN: 10 mg/dL (ref 6–23)
CALCIUM: 8.7 mg/dL (ref 8.4–10.5)
CHLORIDE: 106 meq/L (ref 96–112)
CO2: 22 meq/L (ref 19–32)
CREATININE: 0.96 mg/dL (ref 0.50–1.10)
Glucose, Bld: 113 mg/dL — ABNORMAL HIGH (ref 70–99)
Potassium: 3.9 mEq/L (ref 3.5–5.3)
Sodium: 139 mEq/L (ref 135–145)
Total Protein: 6.1 g/dL (ref 6.0–8.3)

## 2013-11-25 LAB — LIPID PANEL
CHOLESTEROL: 197 mg/dL (ref 0–200)
HDL: 61 mg/dL (ref >39)
LDL Cholesterol: 119 mg/dL — ABNORMAL HIGH (ref 0–99)
TRIGLYCERIDES: 87 mg/dL (ref <150)
Total CHOL/HDL Ratio: 3.2 Ratio
VLDL: 17 mg/dL (ref 0–40)

## 2013-11-25 LAB — POCT UA - MICROSCOPIC ONLY
CRYSTALS, UR, HPF, POC: NEGATIVE
Casts, Ur, LPF, POC: NEGATIVE
Mucus, UA: POSITIVE
TRICHOMONAS UA: POSITIVE
Yeast, UA: NEGATIVE

## 2013-11-25 LAB — POCT GLYCOSYLATED HEMOGLOBIN (HGB A1C): Hemoglobin A1C: 4.9

## 2013-11-25 LAB — SEDIMENTATION RATE: Sed Rate: 20 mm/hr (ref 0–22)

## 2013-11-25 LAB — GLUCOSE, POCT (MANUAL RESULT ENTRY): POC Glucose: 113 mg/dl — AB (ref 70–99)

## 2013-11-25 LAB — TSH: TSH: 4.639 u[IU]/mL — AB (ref 0.350–4.500)

## 2013-11-25 MED ORDER — METRONIDAZOLE 500 MG PO TABS: 500.0000 mg | ORAL_TABLET | Freq: Two times a day (BID) | ORAL | Status: DC

## 2013-11-25 MED ORDER — HYDROCODONE-ACETAMINOPHEN 10-325 MG PO TABS: 1.0000 | ORAL_TABLET | Freq: Two times a day (BID) | ORAL | Status: DC

## 2013-11-25 MED ORDER — METHOCARBAMOL 750 MG PO TABS: 750.0000 mg | ORAL_TABLET | Freq: Four times a day (QID) | ORAL | Status: DC

## 2013-11-25 NOTE — Progress Notes (Signed)
Subjective:    Patient ID: [name], female    DOB: 05/08/1961, 53 y.o.   MRN: [id number]  HPI Pt is here complaining of severe back pain. Pt states she has gained weight recently, and believes this is worsening her back pain. She states walking, and day to day housework flares up her back pain, especially on the left side. She has been followed by [facility] for 20 years. She has recieved 2 spinal in injections this year from [place]. She has active rhuematoid arthritis and fibromyalgia and is followed by Dr. Ardis Rowan for these issues.   Pt states she does not have a PCP. She hasn't had a physical recently. She states she has not had a colonoscopy. She has not had a mammogram in the past 3 years at least. Pt is an active tobacco smoker.  Pt states she has had insatiable thirst recently. She thinks this may be due to her medications, but is worried about new inset diabetes. She states she does have frequent urination, but relates this to her increased thirst.   Review of Systems     Objective:   Physical Exam  Constitutional: She is oriented to person, place, and time. She appears well-nourished. No distress.  HENT:  Head: Normocephalic.  Eyes: EOM are normal.  Neck: Normal range of motion.  Cardiovascular: Normal rate, regular rhythm and normal heart sounds.   Pulmonary/Chest: Effort normal and breath sounds normal.  Abdominal: Soft. There is tenderness.  Musculoskeletal: She exhibits tenderness.       Lumbar back: She exhibits decreased range of motion, tenderness and pain. She exhibits no swelling, no edema, no deformity and normal pulse.  Neurological: She is alert and oriented to person, place, and time. She has normal strength. No sensory deficit. She exhibits normal muscle tone. Coordination and gait normal.  Psychiatric: She has a normal mood and affect.     Results for orders placed in visit on 11/25/13  GLUCOSE, POCT (MANUAL RESULT ENTRY)      Result Value  Ref Range   POC Glucose 113 (*) 70 - 99 mg/dl  POCT GLYCOSYLATED HEMOGLOBIN (HGB A1C)      Result Value Ref Range   Hemoglobin A1C 4.9    POCT UA - MICROSCOPIC ONLY      Result Value Ref Range   WBC, Ur, HPF, POC 1-2     RBC, urine, microscopic 3-6     Bacteria, U Microscopic 1+     Mucus, UA pos     Epithelial cells, urine per micros 2-4     Crystals, Ur, HPF, POC neg     Casts, Ur, LPF, POC neg     Yeast, UA neg     Trichomonas, UA pos    POCT URINALYSIS DIPSTICK      Result Value Ref Range   Color, UA yellow     Clarity, UA clear     Glucose, UA neg     Bilirubin, UA neg     Ketones, UA neg     Spec Grav, UA 1.010     Blood, UA neg     pH, UA 5.5     Protein, UA neg     Urobilinogen, UA 0.2     Nitrite, UA neg     Leukocytes, UA Trace          Assessment & Plan:  UTI/Trichomonas Chronic LBP/Needs physical therapy No CPE in 5-10 years/Must have full eval and screening Schedule 104 appt [facility]  10/325/Narcotics reviewed with her Quit smoking reviewed.

## 2013-11-25 NOTE — Patient Instructions (Addendum)
Trichomoniasis Trichomoniasis is an infection caused by an organism called Trichomonas. The infection can affect both women and men. In women, the outer female genitalia and the vagina are affected. In men, the penis is mainly affected, but the prostate and other reproductive organs can also be involved. Trichomoniasis is a sexually transmitted infection (STI) and is most often passed to another person through sexual contact.  RISK FACTORS  Having unprotected sexual intercourse.  Having sexual intercourse with an infected partner. SIGNS AND SYMPTOMS  Symptoms of trichomoniasis in women include:  Abnormal gray-green frothy vaginal discharge.  Itching and irritation of the vagina.  Itching and irritation of the area outside the vagina. Symptoms of trichomoniasis in men include:   Penile discharge with or without pain.  Pain during urination. This results from inflammation of the urethra. DIAGNOSIS  Trichomoniasis may be found during a Pap test or physical exam. Your health care provider may use one of the following methods to help diagnose this infection:  Examining vaginal discharge under a microscope. For men, urethral discharge would be examined.  Testing the pH of the vagina with a test tape.  Using a vaginal swab test that checks for the Trichomonas organism. A test is available that provides results within a few minutes.  Doing a culture test for the organism. This is not usually needed. TREATMENT   You may be given medicine to fight the infection. Women should inform their health care provider if they could be or are pregnant. Some medicines used to treat the infection should not be taken during pregnancy.  Your health care provider may recommend over-the-counter medicines or creams to decrease itching or irritation.  Your sexual partner will need to be treated if infected. HOME CARE INSTRUCTIONS   Take all medicine prescribed by your health care provider.  Take  over-the-counter medicine for itching or irritation as directed by your health care provider.  Do not have sexual intercourse while you have the infection.  Women should not douche or wear tampons while they have the infection.  Discuss your infection with your partner. Your partner may have gotten the infection from you, or you may have gotten it from your partner.  Have your sex partner get examined and treated if necessary.  Practice safe, informed, and protected sex.  See your health care provider for other STI testing. SEEK MEDICAL CARE IF:   You still have symptoms after you finish your medicine.  You develop abdominal pain.  You have pain when you urinate.  You have bleeding after sexual intercourse.  You develop a rash.  Your medicine makes you sick or makes you throw up (vomit). Document Released: 10/27/2000 Document Revised: 05/08/2013 Document Reviewed: 02/12/2013 [facility] Patient Information 2015 [place], [place]. This information is not intended to replace advice given to you by your health care provider. Make sure you discuss any questions you have with your health care provider. Smoking Cessation Quitting smoking is important to your health and has many advantages. However, it is not always easy to quit since nicotine is a very addictive drug. Often times, people try 3 times or more before being able to quit. This document explains the best ways for you to prepare to quit smoking. Quitting takes hard work and a lot of effort, but you can do it. ADVANTAGES OF QUITTING SMOKING  You will live longer, feel better, and live better.  Your body will feel the impact of quitting smoking almost immediately.  Within 20 minutes, blood pressure decreases. Your  pulse returns to its normal level.  After 8 hours, carbon monoxide levels in the blood return to normal. Your oxygen level increases.  After 24 hours, the chance of having a heart attack starts to decrease. Your breath,  hair, and body stop smelling like smoke.  After 48 hours, damaged nerve endings begin to recover. Your sense of taste and smell improve.  After 72 hours, the body is virtually free of nicotine. Your bronchial tubes relax and breathing becomes easier.  After 2 to 12 weeks, lungs can hold more air. Exercise becomes easier and circulation improves.  The risk of having a heart attack, stroke, cancer, or lung disease is greatly reduced.  After 1 year, the risk of coronary heart disease is cut in half.  After 5 years, the risk of stroke falls to the same as a nonsmoker.  After 10 years, the risk of lung cancer is cut in half and the risk of other cancers decreases significantly.  After 15 years, the risk of coronary heart disease drops, usually to the level of a nonsmoker.  If you are pregnant, quitting smoking will improve your chances of having a healthy baby.  The people you live with, especially any children, will be healthier.  You will have extra money to spend on things other than cigarettes. QUESTIONS TO THINK ABOUT BEFORE ATTEMPTING TO QUIT You may want to talk about your answers with your caregiver.  Why do you want to quit?  If you tried to quit in the past, what helped and what did not?  What will be the most difficult situations for you after you quit? How will you plan to handle them?  Who can help you through the tough times? Your family? Friends? A caregiver?  What pleasures do you get from smoking? What ways can you still get pleasure if you quit? Here are some questions to ask your caregiver:  How can you help me to be successful at quitting?  What medicine do you think would be best for me and how should I take it?  What should I do if I need more help?  What is smoking withdrawal like? How can I get information on withdrawal? GET READY  Set a quit date.  Change your environment by getting rid of all cigarettes, ashtrays, matches, and lighters in your  home, car, or work. Do not let people smoke in your home.  Review your past attempts to quit. Think about what worked and what did not. GET SUPPORT AND ENCOURAGEMENT You have a better chance of being successful if you have help. You can get support in many ways.  Tell your family, friends, and co-workers that you are going to quit and need their support. Ask them not to smoke around you.  Get individual, group, or telephone counseling and support. Programs are available at [employer] and health centers. Call your local health department for information about programs in your area.  Spiritual beliefs and practices may help some smokers quit.  Download a "quit meter" on your computer to keep track of quit statistics, such as how long you have gone without smoking, cigarettes not smoked, and money saved.  Get a self-help book about quitting smoking and staying off of tobacco. [place] yourself from urges to smoke. Talk to someone, go for a walk, or occupy your time with a task.  Change your normal routine. Take a different route to work. Drink tea instead of coffee. Eat  breakfast in a different place.  Reduce your stress. Take a hot bath, exercise, or read a book.  Plan something enjoyable to do every day. Reward yourself for not smoking.  Explore interactive web-based programs that specialize in helping you quit. GET MEDICINE AND USE IT CORRECTLY Medicines can help you stop smoking and decrease the urge to smoke. Combining medicine with the above behavioral methods and support can greatly increase your chances of successfully quitting smoking.  Nicotine replacement therapy helps deliver nicotine to your body without the negative effects and risks of smoking. Nicotine replacement therapy includes nicotine gum, lozenges, inhalers, nasal sprays, and skin patches. Some may be available over-the-counter and others require a prescription.  Antidepressant  medicine helps people abstain from smoking, but how this works is unknown. This medicine is available by prescription.  Nicotinic receptor partial agonist medicine simulates the effect of nicotine in your brain. This medicine is available by prescription. Ask your caregiver for advice about which medicines to use and how to use them based on your health history. Your caregiver will tell you what side effects to look out for if you choose to be on a medicine or therapy. Carefully read the information on the package. Do not use any other product containing nicotine while using a nicotine replacement product.  RELAPSE OR DIFFICULT SITUATIONS Most relapses occur within the first 3 months after quitting. Do not be discouraged if you start smoking again. Remember, most people try several times before finally quitting. You may have symptoms of withdrawal because your body is used to nicotine. You may crave cigarettes, be irritable, feel very hungry, cough often, get headaches, or have difficulty concentrating. The withdrawal symptoms are only temporary. They are strongest when you first quit, but they will go away within 10-14 days. To reduce the chances of relapse, try to:  Avoid drinking alcohol. Drinking lowers your chances of successfully quitting.  Reduce the amount of caffeine you consume. Once you quit smoking, the amount of caffeine in your body increases and can give you symptoms, such as a rapid heartbeat, sweating, and anxiety.  Avoid smokers because they can make you want to smoke.  Do not let weight gain distract you. Many smokers will gain weight when they quit, usually less than 10 pounds. Eat a healthy diet and stay active. You can always lose the weight gained after you quit.  Find ways to improve your mood other than smoking. FOR MORE INFORMATION  www.smokefree.gov  Document Released: 04/27/2001 Document Revised: 11/02/2011 Document Reviewed: 08/12/2011 [facility] Patient Information  2015 [place], [place]. This information is not intended to replace advice given to you by your health care provider. Make sure you discuss any questions you have with your health care provider.   [facility] Policy for Prescribing Controlled Substances (Revised 03/2012) 1. Prescriptions for controlled substances will be filled by ONE provider at [facility] with whom you have established and developed a plan for your care, including follow-up. 2. You are encouraged to schedule an appointment with your prescriber at our appointment center for follow-up visits whenever possible. 3. If you request a prescription for the controlled substance while at [facility] for an acute problem (with someone other than your regular prescriber), you MAY be given a ONE-TIME prescription for a 30-day supply of the controlled substance, to allow time for you to return to see your regular prescriber for additional prescriptions. 4.

## 2013-11-25 NOTE — Progress Notes (Signed)
   Subjective:    Patient ID: [name], female    DOB: Feb 17, 1961, 53 y.o.   MRN: [id number]  HPI    Review of Systems     Objective:   Physical Exam        Assessment & Plan:

## 2013-11-27 ENCOUNTER — Encounter: Payer: Self-pay | Admitting: Radiology

## 2013-11-30 NOTE — Progress Notes (Signed)
CPE scheduled for 9/18 with Dr. Marin Comment.

## 2013-12-31 ENCOUNTER — Ambulatory Visit (INDEPENDENT_AMBULATORY_CARE_PROVIDER_SITE_OTHER): Payer: BC Managed Care – PPO

## 2013-12-31 ENCOUNTER — Ambulatory Visit (INDEPENDENT_AMBULATORY_CARE_PROVIDER_SITE_OTHER): Payer: BC Managed Care – PPO | Admitting: Podiatry

## 2013-12-31 DIAGNOSIS — R52 Pain, unspecified: Secondary | ICD-10-CM

## 2013-12-31 DIAGNOSIS — M19079 Primary osteoarthritis, unspecified ankle and foot: Secondary | ICD-10-CM

## 2013-12-31 DIAGNOSIS — M7750 Other enthesopathy of unspecified foot: Secondary | ICD-10-CM

## 2013-12-31 MED ORDER — HYDROCODONE-ACETAMINOPHEN 10-325 MG PO TABS
1.0000 | ORAL_TABLET | Freq: Three times a day (TID) | ORAL | Status: DC | PRN
Start: 2013-12-31 — End: 2014-04-23

## 2013-12-31 MED ORDER — TRIAMCINOLONE ACETONIDE 10 MG/ML IJ SUSP
10.0000 mg | Freq: Once | INTRAMUSCULAR | Status: AC
  Administered 2013-12-31: 10 mg

## 2013-12-31 NOTE — Progress Notes (Signed)
   Subjective:    Patient ID: [name], female    DOB: 05-Apr-1961, 53 y.o.   MRN: [id number]  HPI PT STATED B/L TOP OF THE FEET HAVE SPUR AND STILL HURTING FOR 7 YEAR. THE FEET ARE GETTING WORSE SOME DAYS. THE FEET GET AGGRAVATED BY PRESSURE. TRIED NO TREATMENT.   Review of Systems  All other systems reviewed and are negative.      Objective:   Physical Exam        Assessment & Plan:

## 2014-01-02 NOTE — Progress Notes (Signed)
Subjective:     Patient ID: [name], female   DOB: August 17, 1960, 53 y.o.   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 Injected around the dorsal tendon complex of both feet 3 mg Kenalog 5 mg Xylocaine Marcaine mixture and scanned for custom orthotics to try to reduce stress against her feet and reduce the pain she is experiencing. Patient will be seen back when orthotics returned or if any issues should occur I'm also ordering blood work to rule out systemic disease

## 2014-01-25 ENCOUNTER — Ambulatory Visit: Payer: BC Managed Care – PPO

## 2014-01-25 DIAGNOSIS — M7750 Other enthesopathy of unspecified foot: Secondary | ICD-10-CM

## 2014-01-25 DIAGNOSIS — R52 Pain, unspecified: Secondary | ICD-10-CM

## 2014-01-25 NOTE — Patient Instructions (Signed)

## 2014-01-25 NOTE — Progress Notes (Signed)
Pt is here to PUO 

## 2014-02-01 ENCOUNTER — Encounter: Payer: BC Managed Care – PPO | Admitting: Family Medicine

## 2014-02-20 ENCOUNTER — Ambulatory Visit: Payer: BC Managed Care – PPO | Admitting: Podiatry

## 2014-04-23 ENCOUNTER — Encounter (HOSPITAL_COMMUNITY): Payer: Self-pay | Admitting: *Deleted

## 2014-04-23 ENCOUNTER — Observation Stay (HOSPITAL_COMMUNITY): Payer: BC Managed Care – PPO

## 2014-04-23 ENCOUNTER — Encounter (HOSPITAL_COMMUNITY)
Admission: EM | Disposition: A | Discharge status: Home / Self Care | Payer: Self-pay | Source: Home / Self Care | Attending: Cardiovascular Disease

## 2014-04-23 ENCOUNTER — Observation Stay (HOSPITAL_COMMUNITY)
Admission: EM | Admit: 2014-04-23 | Discharge: 2014-04-24 | Disposition: A | Discharge status: Home / Self Care | Payer: BC Managed Care – PPO | Attending: Cardiovascular Disease | Admitting: Cardiovascular Disease

## 2014-04-23 DIAGNOSIS — R079 Chest pain, unspecified: Secondary | ICD-10-CM

## 2014-04-23 DIAGNOSIS — M069 Rheumatoid arthritis, unspecified: Secondary | ICD-10-CM | POA: Insufficient documentation

## 2014-04-23 DIAGNOSIS — F419 Anxiety disorder, unspecified: Secondary | ICD-10-CM | POA: Insufficient documentation

## 2014-04-23 DIAGNOSIS — F319 Bipolar disorder, unspecified: Secondary | ICD-10-CM | POA: Insufficient documentation

## 2014-04-23 DIAGNOSIS — Z87891 Personal history of nicotine dependence: Secondary | ICD-10-CM | POA: Insufficient documentation

## 2014-04-23 DIAGNOSIS — I209 Angina pectoris, unspecified: Secondary | ICD-10-CM

## 2014-04-23 DIAGNOSIS — M797 Fibromyalgia: Secondary | ICD-10-CM | POA: Insufficient documentation

## 2014-04-23 DIAGNOSIS — I309 Acute pericarditis, unspecified: Secondary | ICD-10-CM | POA: Diagnosis not present

## 2014-04-23 DIAGNOSIS — R9431 Abnormal electrocardiogram [ECG] [EKG]: Secondary | ICD-10-CM | POA: Diagnosis not present

## 2014-04-23 DIAGNOSIS — I071 Rheumatic tricuspid insufficiency: Secondary | ICD-10-CM | POA: Diagnosis not present

## 2014-04-23 DIAGNOSIS — Z87898 Personal history of other specified conditions: Secondary | ICD-10-CM | POA: Diagnosis not present

## 2014-04-23 HISTORY — DX: Fibromyalgia: M79.7

## 2014-04-23 HISTORY — DX: Low back pain: M54.5

## 2014-04-23 HISTORY — DX: Acute pericarditis, unspecified: I30.9

## 2014-04-23 HISTORY — DX: Low back pain, unspecified: M54.50

## 2014-04-23 HISTORY — PX: LEFT HEART CATHETERIZATION WITH CORONARY ANGIOGRAM: SHX5451

## 2014-04-23 HISTORY — DX: Other chronic pain: G89.29

## 2014-04-23 LAB — CBC
HCT: 34.9 % — ABNORMAL LOW (ref 36.0–46.0)
Hemoglobin: 11.5 g/dL — ABNORMAL LOW (ref 12.0–15.0)
MCH: 30.4 pg (ref 26.0–34.0)
MCHC: 33 g/dL (ref 30.0–36.0)
MCV: 92.3 fL (ref 78.0–100.0)
Platelets: 292 10*3/uL (ref 150–400)
RBC: 3.78 MIL/uL — AB (ref 3.87–5.11)
RDW: 14.3 % (ref 11.5–15.5)
WBC: 7.4 10*3/uL (ref 4.0–10.5)

## 2014-04-23 LAB — BASIC METABOLIC PANEL
Anion gap: 10 (ref 5–15)
BUN: 7 mg/dL (ref 6–23)
CO2: 27 meq/L (ref 19–32)
Calcium: 9.6 mg/dL (ref 8.4–10.5)
Chloride: 103 mEq/L (ref 96–112)
Creatinine, Ser: 0.84 mg/dL (ref 0.50–1.10)
GFR calc Af Amer: >90 mL/min (ref >90)
GFR, EST NON AFRICAN AMERICAN: 79 mL/min — AB (ref >90)
Glucose, Bld: 92 mg/dL (ref 70–99)
Potassium: 4.8 mEq/L (ref 3.7–5.3)
Sodium: 140 mEq/L (ref 137–147)

## 2014-04-23 LAB — POCT I-STAT TROPONIN I: Troponin i, poc: 0 ng/mL (ref 0.00–0.08)

## 2014-04-23 LAB — TROPONIN I: Troponin I: <0.3 ng/mL (ref <0.30)

## 2014-04-23 SURGERY — LEFT HEART CATHETERIZATION WITH CORONARY ANGIOGRAM
Anesthesia: LOCAL

## 2014-04-23 MED ORDER — OXYCODONE-ACETAMINOPHEN 5-325 MG PO TABS
1.0000 | ORAL_TABLET | Freq: Four times a day (QID) | ORAL | Status: DC | PRN
  Administered 2014-04-23 – 2014-04-24 (×2): 1 via ORAL
  Filled 2014-04-23 (×2): qty 1

## 2014-04-23 MED ORDER — PREGABALIN 100 MG PO CAPS: 200.0000 mg | ORAL_CAPSULE | Freq: Two times a day (BID) | ORAL | Status: DC

## 2014-04-23 MED ORDER — PNEUMOCOCCAL VAC POLYVALENT 25 MCG/0.5ML IJ INJ
0.5000 mL | INJECTION | INTRAMUSCULAR | Status: AC
  Administered 2014-04-24: 10:00:00 0.5 mL via INTRAMUSCULAR
  Filled 2014-04-23: qty 0.5

## 2014-04-23 MED ORDER — ACETAMINOPHEN 325 MG PO TABS: 650.0000 mg | ORAL_TABLET | ORAL | Status: DC | PRN

## 2014-04-23 MED ORDER — DULOXETINE HCL 60 MG PO CPEP: 120.0000 mg | ORAL_CAPSULE | Freq: Every day | ORAL | Status: DC

## 2014-04-23 MED ORDER — HYDROCODONE-ACETAMINOPHEN 10-325 MG PO TABS
1.0000 | ORAL_TABLET | Freq: Four times a day (QID) | ORAL | Status: DC | PRN
  Filled 2014-04-23: qty 1

## 2014-04-23 MED ORDER — HEPARIN SODIUM (PORCINE) 1000 UNIT/ML IJ SOLN
INTRAMUSCULAR | Status: AC
  Filled 2014-04-23: qty 1

## 2014-04-23 MED ORDER — HEPARIN BOLUS VIA INFUSION
4000.0000 [IU] | Freq: Once | INTRAVENOUS | Status: DC
  Filled 2014-04-23: qty 4000

## 2014-04-23 MED ORDER — VERAPAMIL HCL 2.5 MG/ML IV SOLN
INTRAVENOUS | Status: AC
  Filled 2014-04-23: qty 2

## 2014-04-23 MED ORDER — HEPARIN (PORCINE) IN NACL 2-0.9 UNIT/ML-% IJ SOLN
INTRAMUSCULAR | Status: AC
  Filled 2014-04-23: qty 1000

## 2014-04-23 MED ORDER — LIDOCAINE HCL (PF) 1 % IJ SOLN
INTRAMUSCULAR | Status: AC
  Filled 2014-04-23: qty 30

## 2014-04-23 MED ORDER — RISPERIDONE 3 MG PO TABS
3.0000 mg | ORAL_TABLET | Freq: Every day | ORAL | Status: DC
  Administered 2014-04-24: 3 mg via ORAL
  Filled 2014-04-23: qty 1

## 2014-04-23 MED ORDER — MIDAZOLAM HCL 2 MG/2ML IJ SOLN
INTRAMUSCULAR | Status: AC
  Filled 2014-04-23: qty 2

## 2014-04-23 MED ORDER — METHOCARBAMOL 750 MG PO TABS: 750.0000 mg | ORAL_TABLET | Freq: Four times a day (QID) | ORAL | Status: DC

## 2014-04-23 MED ORDER — IBUPROFEN 600 MG PO TABS
600.0000 mg | ORAL_TABLET | Freq: Three times a day (TID) | ORAL | Status: DC
  Administered 2014-04-23 – 2014-04-24 (×3): 600 mg via ORAL
  Filled 2014-04-23 (×5): qty 1

## 2014-04-23 MED ORDER — ATOMOXETINE HCL 60 MG PO CAPS: 60.0000 mg | ORAL_CAPSULE | Freq: Every day | ORAL | Status: DC

## 2014-04-23 MED ORDER — COLCHICINE 0.6 MG PO TABS
0.6000 mg | ORAL_TABLET | Freq: Every day | ORAL | Status: DC
  Administered 2014-04-24: 10:00:00 0.6 mg via ORAL
  Filled 2014-04-23 (×2): qty 1

## 2014-04-23 MED ORDER — DULOXETINE HCL 60 MG PO CPEP
120.0000 mg | ORAL_CAPSULE | Freq: Every day | ORAL | Status: DC
  Administered 2014-04-23 – 2014-04-24 (×2): 120 mg via ORAL
  Filled 2014-04-23 (×2): qty 2

## 2014-04-23 MED ORDER — FENTANYL CITRATE 0.05 MG/ML IJ SOLN
INTRAMUSCULAR | Status: AC
  Filled 2014-04-23: qty 2

## 2014-04-23 MED ORDER — SODIUM CHLORIDE 0.9 % IV SOLN
INTRAVENOUS | Status: AC
  Administered 2014-04-23: 14:00:00 via INTRAVENOUS

## 2014-04-23 MED ORDER — RISPERIDONE 2 MG PO TABS: 2.5000 mg | ORAL_TABLET | Freq: Every day | ORAL | Status: DC

## 2014-04-23 MED ORDER — NITROGLYCERIN 1 MG/10 ML FOR IR/CATH LAB
INTRA_ARTERIAL | Status: AC
Start: 2014-04-23 — End: 2014-04-23
  Filled 2014-04-23: qty 10

## 2014-04-23 MED ORDER — OXYCODONE-ACETAMINOPHEN 5-325 MG PO TABS: 1.0000 | ORAL_TABLET | Freq: Four times a day (QID) | ORAL | Status: DC | PRN

## 2014-04-23 MED ORDER — HEPARIN (PORCINE) IN NACL 100-0.45 UNIT/ML-% IJ SOLN
1150.0000 [IU]/h | INTRAMUSCULAR | Status: DC
  Filled 2014-04-23: qty 250

## 2014-04-23 MED ORDER — ONDANSETRON HCL 4 MG/2ML IJ SOLN: 4.0000 mg | Freq: Four times a day (QID) | INTRAMUSCULAR | Status: DC | PRN

## 2014-04-23 MED ORDER — PREGABALIN 100 MG PO CAPS
200.0000 mg | ORAL_CAPSULE | Freq: Two times a day (BID) | ORAL | Status: DC
  Administered 2014-04-23 – 2014-04-24 (×2): 200 mg via ORAL
  Filled 2014-04-23 (×3): qty 2

## 2014-04-23 MED ORDER — NICOTINE 21 MG/24HR TD PT24
21.0000 mg | MEDICATED_PATCH | Freq: Every day | TRANSDERMAL | Status: DC
  Administered 2014-04-23 – 2014-04-24 (×2): 21 mg via TRANSDERMAL
  Filled 2014-04-23 (×2): qty 1

## 2014-04-23 NOTE — ED Notes (Signed)
Cards at bedside; pt to cath lab stat; hold heparin per Dr. Julianne Handler.

## 2014-04-23 NOTE — Progress Notes (Signed)
ANTICOAGULATION CONSULT NOTE - Initial Consult  Pharmacy Consult for Heparin Indication: chest pain/ACS  Allergies  Allergen Reactions  . Elavil [Amitriptyline]     Patient Measurements: Height: 5' 5.5" (166.4 cm) Weight: 250 lb (113.399 kg) IBW/kg (Calculated) : 58.15 Heparin Dosing Weight: 85 kg  Vital Signs: Temp: 97.4 F (36.3 C) (12/08 1205) Temp Source: Oral (12/08 1205) BP: 96/70 mmHg (12/08 1205) Pulse Rate: 72 (12/08 1205)  Labs: No results for input(s): HGB, HCT, PLT, APTT, LABPROT, INR, HEPARINUNFRC, CREATININE, CKTOTAL, CKMB, TROPONINI in the last 72 hours.  Estimated Creatinine Clearance: 87.9 mL/min (by C-G formula based on Cr of 0.96).   Medical History: Past Medical History  Diagnosis Date  . Allergy   . Anxiety   . Anemia   . Arthritis   . Substance abuse   . Bipolar 1 disorder   . RA (rheumatoid arthritis)   . Chronic pain     Medications:  See electronic med rec  Assessment: 53 y.o. female presents with CP. To begin heparin for r/o ACS. Baseline labs pending. No AC PTA.  Goal of Therapy:  Heparin level 0.3-0.7 units/ml Monitor platelets by anticoagulation protocol: Yes   Plan:  1) Heparin 4000 units IV bolus 2) Heparin gtt at 1150 units/hr 3) Will f/u 6 hr heparin level 4) Daily heparin level and CBC  Sherlon Handing, PharmD, BCPS Clinical pharmacist, pager [phone] 04/23/2014,12:25 PM

## 2014-04-23 NOTE — CV Procedure (Addendum)
      Cardiac Catheterization Operative Report  [name] [id number] 12/8/20151:30 PM No PCP Per Patient  Procedure Performed:  1. Left Heart Catheterization 2. Selective Coronary Angiography 3. Left ventricular angiogram  Operator: Lauree Chandler, MD  Arterial access site:  Right radial artery.   Indication: 53 yo female with history of tobacco abuse, fibromyalgia, RA admitted with chest pain. EKG with diffuse ST elevation. EKG is more consistent with pericarditis but urgent cath planned given her clinical presentation, EKG findings and risk factors for CAD including long history of tobacco abuse.                                      Procedure Details: The risks, benefits, complications, treatment options, and expected outcomes were discussed with the patient. The patient and/or family concurred with the proposed plan, giving informed consent. The patient was brought to the cath lab after IV hydration was begun and oral premedication was given. The patient was further sedated with Versed and Fentanyl. The right wrist was assessed with a modified Allens test which was positive. The right wrist was prepped and draped in a sterile fashion. 1% lidocaine was used for local anesthesia. Using the modified Seldinger access technique, a 5 French sheath was placed in the right radial artery. 3 mg Verapamil was given through the sheath. 5500 units IV heparin was given. Standard diagnostic catheters were used to perform selective coronary angiography. A pigtail catheter was used to perform a left ventricular angiogram. The sheath was removed from the right radial artery and a hemostasis band was applied at the arteriotomy site on the right wrist.   There were no immediate complications. The patient was taken to the recovery area in stable condition.   Hemodynamic Findings: Central aortic pressure: 103/66 Left ventricular pressure: 101/10/18  Angiographic Findings:  Left main: No  obstructive disease.   Left Anterior Descending Artery: Large caliber vessel that courses to the apex. Large caliber diagonal branch. No obstructive disease.   Circumflex Artery: Large caliber vessel with moderate caliber first obtuse marginal branch and large caliber second obtuse marginal branch. No obstructive disease.   Right Coronary Artery: Large dominant vessel with no obstructive disease.   Left Ventricular Angiogram: LVEF=65-70%.   Impression: 1. No angiographic evidence of CAD 2. Normal LV systolic function 3. Chest pain syndrome and EKG changes most c/w pericarditis.   Recommendations: Will treat potential pericarditis with NSAIDS, colchicine. Echo later today. Monitor overnight. Possible d/c home in am.        Complications:  None. The patient tolerated the procedure well.

## 2014-04-23 NOTE — Interval H&P Note (Signed)
History and Physical Interval Note:  04/23/2014 1:00 PM  [name]  has presented today for cardiac cath with the diagnosis of chest pain/UA. The various methods of treatment have been discussed with the patient and family. After consideration of risks, benefits and other options for treatment, the patient has consented to  Procedure(s): LEFT HEART CATHETERIZATION WITH CORONARY ANGIOGRAM (N/A) as a surgical intervention .  The patient's history has been reviewed, patient examined, no change in status, stable for surgery.  I have reviewed the patient's chart and labs.  Questions were answered to the patient's satisfaction.    Cath Lab Visit (complete for each Cath Lab visit)  Clinical Evaluation Leading to the Procedure:   ACS: No.  Non-ACS:    Anginal Classification: CCS III  Anti-ischemic medical therapy: No Therapy  Non-Invasive Test Results: No non-invasive testing performed  Prior CABG: No previous CABG        [name]

## 2014-04-23 NOTE — ED Provider Notes (Signed)
CSN: [id number]     Arrival date & time 04/23/14  1155 History   First MD Initiated Contact with Patient 04/23/14 1209     Chief Complaint  Patient presents with  . Chest Pain     (Consider location/radiation/quality/duration/timing/severity/associated sxs/prior Treatment) HPI  The patient had 2 episodes of chest pain this morning. One episode was at approximately 9 AM. She reports it was on the right side of her chest and into her jaw. She reports that it only lasted for a few minutes and resolved. She went on to work and at approximately 1145 got another episode of chest pain were by this time it was in the right side of her chest and radiating across her left chest. She reports there was also some discomfort in her jaw. She reports she felt mildly nauseated but did not have other associated symptoms. The patient went to the clinic in her building and they were not able to do an EKG. She reports that she was treated on route with aspirin and nitroglycerin. She reports that the nitroglycerin resolved her pain significantly and at this point she qualifies herself as being pain-free. The patient has not had any cardiac evaluation or testing done in likely greater than 8-10 years. She is a chronic smoker.  Past Medical History  Diagnosis Date  . Allergy   . Anxiety   . Anemia   . Arthritis   . Substance abuse   . Bipolar 1 disorder   . RA (rheumatoid arthritis)   . Chronic pain    Past Surgical History  Procedure Laterality Date  . Cesarean section    . Abdominal hysterectomy    . Foot joint surgery     No family history on file. History  Substance Use Topics  . Smoking status: Current Every Day Smoker -- 1.00 packs/day for 34 years    Types: Cigarettes  . Smokeless tobacco: Not on file  . Alcohol Use: No   OB History    No data available     Review of Systems  10 Systems reviewed and are negative for acute change except as noted in the HPI.   Allergies  Elavil  Home  Medications   Prior to Admission medications   Medication Sig Start Date End Date Taking? Authorizing Provider  atomoxetine (STRATTERA) 60 MG capsule Take 60 mg by mouth daily.    Historical Provider, MD  DULoxetine (CYMBALTA) 20 MG capsule Take 120 mg by mouth daily.    Historical Provider, MD  HYDROcodone-acetaminophen (NORCO) 10-325 MG per tablet Take 1 tablet by mouth 2 (two) times daily. 11/25/13   Orma Flaming, MD  HYDROcodone-acetaminophen [facility]) 10-325 MG per tablet Take 1 tablet by mouth every 8 (eight) hours as needed. 12/31/13   Wallene Huh, DPM  methocarbamol (ROBAXIN-750) 750 MG tablet Take 1 tablet (750 mg total) by mouth 4 (four) times daily. 09/28/13   Orma Flaming, MD  methocarbamol (ROBAXIN-750) 750 MG tablet Take 1 tablet (750 mg total) by mouth 4 (four) times daily. 11/25/13   Orma Flaming, MD  methotrexate 1 G injection Inject into the muscle once a week. Pt injecting 1 ? weekly    Historical Provider, MD  metroNIDAZOLE (FLAGYL) 500 MG tablet Take 1 tablet (500 mg total) by mouth 2 (two) times daily with a meal. DO NOT CONSUME ALCOHOL WHILE TAKING THIS MEDICATION. 11/25/13   Orma Flaming, MD  Oxycodone HCl 10 MG TABS Take 1 tablet (10 mg total)  by mouth 2 (two) times daily as needed. 04/11/12   Orma Flaming, MD  oxyCODONE-acetaminophen (PERCOCET) 10-325 MG per tablet Take 1 tablet by mouth every 8 (eight) hours as needed for pain. 09/28/13   Orma Flaming, MD  pregabalin (LYRICA) 200 MG capsule Take 200 mg by mouth 2 (two) times daily.    Historical Provider, MD  risperiDONE (RISPERDAL) 2 MG tablet Take 2.5 mg by mouth daily.    Historical Provider, MD   BP 96/70 mmHg  Pulse 72  Temp(Src) 97.4 F (36.3 C) (Oral)  Resp 17  Ht 5' 5.5" (1.664 m)  Wt 250 lb (113.399 kg)  BMI 40.95 kg/m2  SpO2 99% Physical Exam  Constitutional: She is oriented to person, place, and time. She appears well-developed and well-nourished.  HENT:  Head: Normocephalic and atraumatic.  Eyes:  EOM are normal. Pupils are equal, round, and reactive to light.  Neck: Neck supple.  Cardiovascular: Normal rate, regular rhythm, normal heart sounds and intact distal pulses.   Pulmonary/Chest: Effort normal and breath sounds normal.  Abdominal: Soft. Bowel sounds are normal. She exhibits no distension. There is no tenderness.  Musculoskeletal: Normal range of motion. She exhibits no edema.  Neurological: She is alert and oriented to person, place, and time. She has normal strength. Coordination normal. GCS eye subscore is 4. GCS verbal subscore is 5. GCS motor subscore is 6.  Skin: Skin is warm, dry and intact.  Psychiatric: She has a normal mood and affect.    ED Course  Procedures (including critical care time) Labs Review Labs Reviewed  CBC  BASIC METABOLIC PANEL  TROPONIN I  HEPARIN LEVEL (UNFRACTIONATED)  I-STAT TROPOININ, ED    Imaging Review No results found.   EKG Interpretation   Date/Time:  Tuesday April 23 2014 12:04:30 EST Ventricular Rate:  70 PR Interval:  113 QRS Duration: 65 QT Interval:  378 QTC Calculation: 408 R Axis:   81 Text Interpretation:  Sinus rhythm Borderline short PR interval Abnormal  R-wave progression, early transition ST elevation, consider inferior  injury inferior ST abnormality. ischemic appearance c/w old Confirmed by  Johnney Killian, MD, Jeannie Done [phone]) on 04/23/2014 12:20:29 PM     Consult: Cardiology at 1220. Advised for concerning EKG changes and history. Will come to the emergency department for evaluation.  CRITICAL CARE Performed by: Charlesetta Shanks   Total critical care time: 30  Critical care time was exclusive of separately billable procedures and treating other patients.  Critical care was necessary to treat or prevent imminent or life-threatening deterioration.  Critical care was time spent personally by me on the following activities: development of treatment plan with patient and/or surrogate as well as nursing,  discussions with consultants, evaluation of patient's response to treatment, examination of patient, obtaining history from patient or surrogate, ordering and performing treatments and interventions, ordering and review of laboratory studies, ordering and review of radiographic studies, pulse oximetry and re-evaluation of patient's condition. MDM   Final diagnoses:  Ischemic chest pain  Abnormal EKG   The patient presents with chest pain without other associated illness or positives on review of systems. At this point her EKG does have concerning appearance of peaking T waves and inferior ST segment slurring that is new compared with old EKG. The patient has had aspirin prior to arrival and got significant pain relief with nitroglycerin given prior to arrival. Currently she is rating herself as pain-free. The patient will be started on heparin for chest pain concerning for ACS.  Charlesetta Shanks, MD 04/23/14 1239

## 2014-04-23 NOTE — Plan of Care (Signed)
Problem: Consults Goal: Cardiac Cath Patient Education (See Patient Education module for education specifics.)  Outcome: Completed/Met Date Met:  04/23/14 Goal: Skin Care Protocol Initiated - if Braden Score 18 or less If consults are not indicated, leave blank or document N/A  Outcome: Completed/Met Date Met:  04/23/14 Goal: Tobacco Cessation referral if indicated Outcome: Completed/Met Date Met:  04/23/14 Goal: Nutrition Consult-if indicated Outcome: Not Applicable Date Met:  72/82/06 Goal: Diabetes Guidelines if Diabetic/Glucose > 140 If diabetic or lab glucose is > 140 mg/dl - Initiate Diabetes/Hyperglycemia Guidelines & Document Interventions  Outcome: Not Applicable Date Met:  01/56/15  Problem: Phase I Progression Outcomes Goal: Pain controlled with appropriate interventions Outcome: Completed/Met Date Met:  04/23/14 Goal: Initial discharge plan identified Outcome: Not Applicable Date Met:  37/94/32 Goal: Voiding-avoid urinary catheter unless indicated Outcome: Completed/Met Date Met:  04/23/14 Goal: Hemodynamically stable Outcome: Completed/Met Date Met:  04/23/14 Goal: Distal pulses equal to baseline Outcome: Completed/Met Date Met:  04/23/14 Goal: Vascular site scale level 0 - I Vascular Site Scale Level 0: No bruising/bleeding/hematoma Level I (Mild): Bruising/Ecchymosis, minimal bleeding/ooozing, palpable hematoma < 3 cm Level II (Moderate): Bleeding not affecting hemodynamic parameters, pseudoaneurysm, palpable hematoma > 3 cm Level III (Severe) Bleeding which affects hemodynamic parameters or retroperitoneal hemorrhage  Outcome: Completed/Met Date Met:  04/23/14 Goal: Post Cath/PCI return to appropriate Path Outcome: Not Applicable Date Met:  76/14/70 Goal: Other Phase I Outcomes/Goals Outcome: Not Applicable Date Met:  92/95/74

## 2014-04-23 NOTE — H&P (Signed)
Patient ID: [name] MRN: [id number] DOB/AGE: December 28, 1960 53 y.o. Admit date: 04/23/2014  Primary Cardiologist: Former Rollene Fare  HPI: 53 yo female with history of bipolar disorder, former substance abuse, RA, tobacco abuse presenting to ED with c/o chest pain. First episode this am over right chest wall with radiation into right shoulder, neck. Resolved after several minutes. Next episode 30 minutes prior to presentation to ED. Pressure across chest from right to left and radiation into left jaw. Associated nausea. Pain resolved with SL NTG and ASA. Pt is currently chest pain free. EKG shows subtle ST elevation in the inferior and anterior leads with the appearance of pericarditis. Given her risk factors for CAD and EKG findings with current clinical presentation, she was brought to the cath lab for urgent cardiac cath. Currently having no chest pain, SOB, palpitations. She is a former pt of Dr. Rollene Fare with last visit over 10 years ago for evaluation of palpitations.   Review of systems complete and found to be negative unless listed above   Past Medical History  Diagnosis Date  . Allergy   . Anxiety   . Anemia   . Arthritis   . Substance abuse   . Bipolar 1 disorder   . RA (rheumatoid arthritis)   . Chronic pain   . Fibromyalgia     Family History  Problem Relation Age of Onset  . Heart attack Maternal Grandfather     History   Social History  . Marital Status: Divorced    Spouse Name: N/A    Number of Children: N/A  . Years of Education: N/A   Occupational History  . Not on file.   Social History Main Topics  . Smoking status: Current Every Day Smoker -- 1.00 packs/day for 34 years    Types: Cigarettes  . Smokeless tobacco: Not on file  . Alcohol Use: No  . Drug Use: No  . Sexual Activity: No   Other Topics Concern  . Not on file   Social History Narrative    Past Surgical History  Procedure Laterality Date  . Cesarean section      x 2  . Abdominal  hysterectomy    . Foot joint surgery    . Gastric bypass    . Shoulder surgery    . Tonsillectomy      Allergies  Allergen Reactions  . Elavil [Amitriptyline]     Prior to Admission Meds:  Prior to Admission medications   Medication Sig Start Date End Date Taking? Authorizing Provider  atomoxetine (STRATTERA) 60 MG capsule Take 60 mg by mouth daily.    Historical Provider, MD  DULoxetine (CYMBALTA) 20 MG capsule Take 120 mg by mouth daily.    Historical Provider, MD  HYDROcodone-acetaminophen (NORCO) 10-325 MG per tablet Take 1 tablet by mouth 2 (two) times daily. 11/25/13   Orma Flaming, MD  HYDROcodone-acetaminophen [facility]) 10-325 MG per tablet Take 1 tablet by mouth every 8 (eight) hours as needed. 12/31/13   Wallene Huh, DPM  methocarbamol (ROBAXIN-750) 750 MG tablet Take 1 tablet (750 mg total) by mouth 4 (four) times daily. 09/28/13   Orma Flaming, MD  methocarbamol (ROBAXIN-750) 750 MG tablet Take 1 tablet (750 mg total) by mouth 4 (four) times daily. 11/25/13   Orma Flaming, MD  methotrexate 1 G injection Inject into the muscle once a week. Pt injecting 1 ? weekly    Historical Provider, MD  metroNIDAZOLE (FLAGYL) 500 MG tablet Take  1 tablet (500 mg total) by mouth 2 (two) times daily with a meal. DO NOT CONSUME ALCOHOL WHILE TAKING THIS MEDICATION. 11/25/13   Orma Flaming, MD  Oxycodone HCl 10 MG TABS Take 1 tablet (10 mg total) by mouth 2 (two) times daily as needed. 04/11/12   Orma Flaming, MD  oxyCODONE-acetaminophen (PERCOCET) 10-325 MG per tablet Take 1 tablet by mouth every 8 (eight) hours as needed for pain. 09/28/13   Orma Flaming, MD  pregabalin (LYRICA) 200 MG capsule Take 200 mg by mouth 2 (two) times daily.    Historical Provider, MD  risperiDONE (RISPERDAL) 2 MG tablet Take 2.5 mg by mouth daily.    Historical Provider, MD    Physical Exam: Blood pressure 96/70, pulse 72, temperature 97.4 F (36.3 C), temperature source Oral, resp. rate 17, height 5' 5.5" (1.664  m), weight 250 lb (113.399 kg), SpO2 99 %.    General: Well developed, well nourished, NAD  HEENT: OP clear, mucus membranes moist  SKIN: warm, dry. No rashes.  Neuro: No focal deficits  Musculoskeletal: Muscle strength 5/5 all ext  Psychiatric: Mood and affect normal  Neck: No JVD, no carotid bruits, no thyromegaly, no lymphadenopathy.  Lungs:Clear bilaterally, no wheezes, rhonci, crackles  Cardiovascular: Regular rate and rhythm. No murmurs, gallops or rubs.  Abdomen:Soft. Bowel sounds present. Non-tender.  Extremities: No lower extremity edema. Pulses are 2 + in the bilateral DP/PT.  Labs:   Lab Results  Component Value Date   WBC 7.4 04/23/2014   HGB 11.5* 04/23/2014   HCT 34.9* 04/23/2014   MCV 92.3 04/23/2014   PLT 292 04/23/2014   EKG: NSR, rate 68 bpm. Diffuse ST elevation with PR depression c/w acute pericarditis.   ASSESSMENT AND PLAN:   1. Chest pain: Pt with concerning story of chest pain c/w unstable angina. EKG with ST changes diffusely which could represent ischemic vs pericarditis. She is currently chest pain free. I have reviewed options for diagnosis of chest pain. Given her heavy smoking history and chest pain, will plan cardiac cath with possible PCI today. Risks and benefits of cath reviewed with pt. She agrees to proceed.   Darlina Guys, MD 04/23/2014, 12:56 PM

## 2014-04-23 NOTE — ED Notes (Signed)
Pt in via [facility], per report pt c/o R sided cp that radiates into R jaw initially now pain is L & R upper CP,denies SOB, n/v/d, pt rcvd 324 mg ASA & x 1 SL nitro pta, initial pain score 10/10, pain score upon arrival to ED 2/10, pt A&O x4, follows commands, speaks in complete sentences

## 2014-04-23 NOTE — ED Notes (Signed)
Pt transported to Cath Lab 6, translucent pads placed on patient, pt aware of plan of care

## 2014-04-23 NOTE — Progress Notes (Signed)
Patient daughter and sister voiced concern regarding patient`s h/o substance abuse (alcohol and drugs).They requested Narcotics NOT to be  Prescribed.[place] PA made aware.

## 2014-04-24 DIAGNOSIS — I369 Nonrheumatic tricuspid valve disorder, unspecified: Secondary | ICD-10-CM

## 2014-04-24 LAB — CBC
HEMATOCRIT: 34.5 % — AB (ref 36.0–46.0)
Hemoglobin: 11.5 g/dL — ABNORMAL LOW (ref 12.0–15.0)
MCH: 31 pg (ref 26.0–34.0)
MCHC: 33.3 g/dL (ref 30.0–36.0)
MCV: 93 fL (ref 78.0–100.0)
Platelets: 326 10*3/uL (ref 150–400)
RBC: 3.71 MIL/uL — ABNORMAL LOW (ref 3.87–5.11)
RDW: 14.4 % (ref 11.5–15.5)
WBC: 7.8 10*3/uL (ref 4.0–10.5)

## 2014-04-24 LAB — BASIC METABOLIC PANEL
Anion gap: 12 (ref 5–15)
BUN: 7 mg/dL (ref 6–23)
CALCIUM: 8.9 mg/dL (ref 8.4–10.5)
CO2: 24 meq/L (ref 19–32)
Chloride: 104 mEq/L (ref 96–112)
Creatinine, Ser: 0.85 mg/dL (ref 0.50–1.10)
GFR calc Af Amer: >90 mL/min (ref >90)
GFR calc non Af Amer: 78 mL/min — ABNORMAL LOW (ref >90)
GLUCOSE: 88 mg/dL (ref 70–99)
Potassium: 4.6 mEq/L (ref 3.7–5.3)
SODIUM: 140 meq/L (ref 137–147)

## 2014-04-24 MED ORDER — IBUPROFEN 600 MG PO TABS: 600.0000 mg | ORAL_TABLET | Freq: Three times a day (TID) | ORAL | Status: DC

## 2014-04-24 MED ORDER — COLCHICINE 0.6 MG PO TABS: 0.6000 mg | ORAL_TABLET | Freq: Every day | ORAL | Status: DC

## 2014-04-24 MED ORDER — PANTOPRAZOLE SODIUM 40 MG PO TBEC: 40.0000 mg | DELAYED_RELEASE_TABLET | Freq: Every day | ORAL | Status: DC

## 2014-04-24 NOTE — Discharge Instructions (Signed)
NO TOBACCO   PLEASE REMEMBER TO BRING ALL OF YOUR MEDICATIONS TO EACH OF YOUR FOLLOW-UP OFFICE VISITS.  PLEASE ATTEND ALL SCHEDULED FOLLOW-UP APPOINTMENTS.   Activity: Increase activity slowly as tolerated. You may shower, but no soaking baths (or swimming) for 1 week. No driving for 2 days. No lifting over 5 lbs for 1 week. No sexual activity for 1 week.   You May Return to Work: in 1 week (if applicable)  Wound Care: You may wash cath site gently with soap and water. Keep cath site clean and dry. If you notice pain, swelling, bleeding or pus at your cath site, please call [phone].    Cardiac Cath Site Care Refer to this sheet in the next few weeks. These instructions provide you with information on caring for yourself after your procedure. Your caregiver may also give you more specific instructions. Your treatment has been planned according to current medical practices, but problems sometimes occur. Call your caregiver if you have any problems or questions after your procedure. HOME CARE INSTRUCTIONS  You may shower 24 hours after the procedure. Remove the bandage (dressing) and gently wash the site with plain soap and water. Gently pat the site dry.   Do not apply powder or lotion to the site.   Do not sit in a bathtub, swimming pool, or whirlpool for 5 to 7 days.   No bending, squatting, or lifting anything over 10 pounds (4.5 kg) as directed by your caregiver.   Inspect the site at least twice daily.   Do not drive home if you are discharged the same day of the procedure. Have someone else drive you.   You may drive 24 hours after the procedure unless otherwise instructed by your caregiver.  What to expect:  Any bruising will usually fade within 1 to 2 weeks.   Blood that collects in the tissue (hematoma) may be painful to the touch. It should usually decrease in size and tenderness within 1 to 2 weeks.  SEEK IMMEDIATE MEDICAL CARE IF:  You have unusual pain at the site  or down the affected limb.   You have redness, warmth, swelling, or pain at the site.   You have drainage (other than a small amount of blood on the dressing).   You have chills.   You have a fever or persistent symptoms for more than 72 hours.   You have a fever and your symptoms suddenly get worse.   Your leg becomes pale, cool, tingly, or numb.   You have heavy bleeding from the site. Hold pressure on the site.  Document Released: 06/05/2010 Document Revised: 04/22/2011 Document Reviewed: 06/05/2010 [facility] Patient Information 2012 [place].

## 2014-04-24 NOTE — Progress Notes (Signed)
  Echocardiogram 2D Echocardiogram has been performed.  Janisha Bueso 04/24/2014, 9:04 AM

## 2014-04-24 NOTE — Discharge Summary (Signed)
CARDIOLOGY DISCHARGE SUMMARY   Patient ID: [name] MRN: [id number] DOB/AGE: [birth year] 53 y.o.  Admit date: 04/23/2014 Discharge date: 04/24/2014  PCP: No PCP Per Patient Primary Cardiologist: Dr Angelena Form  Primary Discharge Diagnosis:  Acute pericarditis Secondary Discharge Diagnosis:   Procedures: Cardiac catheterization, coronary arteriogram, left ventriculogram, 2-D echocardiogram  Hospital Course: [name] is a 53 y.o. female with no history of CAD. She was a previous patient of Dr. Rollene Fare, but had not seen cardiology in over a decade. She had chest pain and came to the hospital where she was admitted for further evaluation and treatment.  She had subtle ST elevation in the inferior and anterior leads, there was concern for pericarditis based on ECG findings. However, since her ECG was abnormal she was taken to the Cath Lab.  Cardiac catheterization results are below. She had no critical CAD. This preserved. She was started on NSAIDs and an echocardiogram was ordered.  On 12/09, she was seen by Dr. Alvester Chou and all data were reviewed. Her symptoms were controlled with NSAIDs and she was feeling much better. Her echocardiogram was reviewed. No further inpatient workup was indicated and she is considered stable for discharge, to follow up as an outpatient.  BP 110/67 mmHg  Pulse 85  Temp(Src) 97.7 F (36.5 C) (Oral)  Resp 18  Ht 5' 5.5" (1.664 m)  Wt 250 lb 14.1 oz (113.8 kg)  BMI 41.10 kg/m2  SpO2 98% General: Well developed, well nourished, female in no acute distress Head: Eyes PERRLA, No xanthomas.   Normocephalic and atraumatic  Lungs: Clear bilaterally to auscultation. Heart: HRRR S1 S2, without MRG.  Pulses are 2+ & equal. No JVD.  Abdomen: Bowel sounds are present, abdomen soft and non-tender without masses or  hernias noted. Msk: Normal strength and tone for age. Extremities: No clubbing, cyanosis or edema.  Right radial cath site without ecchymosis or  hematoma Skin:  No rashes or lesions noted. Neuro: Alert and oriented X 3. Psych:  Good affect, responds appropriately  Labs:   Lab Results  Component Value Date   WBC 7.8 04/24/2014   HGB 11.5* 04/24/2014   HCT 34.5* 04/24/2014   MCV 93.0 04/24/2014   PLT 326 04/24/2014     Recent Labs Lab 04/24/14 0310  NA 140  K 4.6  CL 104  CO2 24  BUN 7  CREATININE 0.85  CALCIUM 8.9  GLUCOSE 88    Recent Labs  04/23/14 1220  TROPONINI <0.30   Radiology: Dg Chest 2 View 04/23/2014   CLINICAL DATA:  Chest pain  EXAM: CHEST  2 VIEW  COMPARISON:  12/05/2008  FINDINGS: The heart size and mediastinal contours are within normal limits. Both lungs are clear. The visualized skeletal structures are unremarkable.  IMPRESSION: No active cardiopulmonary disease.   Electronically Signed   By: Franchot Gallo M.D.   On: 04/23/2014 17:02   Cardiac Cath: 04/23/2014 Angiographic Findings: Left main: No obstructive disease.  Left Anterior Descending Artery: Large caliber vessel that courses to the apex. Large caliber diagonal branch. No obstructive disease.  Circumflex Artery: Large caliber vessel with moderate caliber first obtuse marginal branch and large caliber second obtuse marginal branch. No obstructive disease.  Right Coronary Artery: Large dominant vessel with no obstructive disease.  Left Ventricular Angiogram: LVEF=65-70%.  Impression: 1. No angiographic evidence of CAD 2. Normal LV systolic function 3. Chest pain syndrome and EKG changes most c/w pericarditis.  Recommendations: Will treat potential pericarditis with NSAIDS, colchicine. Echo  later today. Monitor overnight. Possible d/c home in am.   EKG: 04/23/2014 Sinus rhythm, diffuse mild ST elevation  Echo: Results pending, preliminary results showed no wall motion abnormalities, no effusion and good LV function  FOLLOW UP PLANS AND APPOINTMENTS Allergies  Allergen Reactions  . Elavil [Amitriptyline]     Increases heart  rate     Medication List    STOP taking these medications        HYDROcodone-acetaminophen 10-325 MG per tablet  Commonly known as:  NORCO      TAKE these medications        b complex vitamins capsule  Take 1 capsule by mouth daily.     colchicine 0.6 MG tablet  Take 1 tablet (0.6 mg total) by mouth daily.     DULoxetine 60 MG capsule  Commonly known as:  CYMBALTA  Take 120 mg by mouth daily.     folic acid 211 MCG tablet  Commonly known as:  FOLVITE  Take 400-2,000 mcg by mouth daily.     ibuprofen 600 MG tablet  Commonly known as:  ADVIL,MOTRIN  Take 1 tablet (600 mg total) by mouth 3 (three) times daily.     methotrexate 1 G injection  Commonly known as:  50 mg/ml  Inject into the muscle once a week. Pt injecting 1 ? weekly     oxyCODONE-acetaminophen 5-325 MG per tablet  Commonly known as:  PERCOCET/ROXICET  Take 1 tablet by mouth every 6 (six) hours as needed for moderate pain or severe pain.     pantoprazole 40 MG tablet  Commonly known as:  PROTONIX  Take 1 tablet (40 mg total) by mouth daily.     pregabalin 200 MG capsule  Commonly known as:  LYRICA  Take 200 mg by mouth 2 (two) times daily.     risperiDONE 3 MG tablet  Commonly known as:  RISPERDAL  Take 3 mg by mouth daily.     Vitamin D (Cholecalciferol) 1000 UNITS Tabs  Take 2,000 Units by mouth daily.        Discharge Instructions    Diet - low sodium heart healthy    Complete by:  As directed      Increase activity slowly    Complete by:  As directed             BRING ALL MEDICATIONS WITH YOU TO FOLLOW UP APPOINTMENTS  Time spent with patient to include physician time: 36 min Signed: Rosaria Ferries, PA-C 04/24/2014, 9:34 AM Co-Sign MD

## 2014-04-25 ENCOUNTER — Encounter (HOSPITAL_COMMUNITY): Payer: Self-pay | Admitting: Cardiovascular Disease

## 2014-05-13 ENCOUNTER — Encounter: Payer: Self-pay | Admitting: Cardiology

## 2014-05-13 ENCOUNTER — Encounter: Payer: BC Managed Care – PPO | Admitting: Cardiology

## 2014-05-22 ENCOUNTER — Encounter: Payer: Self-pay | Admitting: Cardiology

## 2014-06-17 ENCOUNTER — Ambulatory Visit (INDEPENDENT_AMBULATORY_CARE_PROVIDER_SITE_OTHER): Payer: BLUE CROSS/BLUE SHIELD | Admitting: Nurse Practitioner

## 2014-06-17 ENCOUNTER — Encounter: Payer: Self-pay | Admitting: Physician Assistant

## 2014-06-17 ENCOUNTER — Encounter: Payer: Self-pay | Admitting: Nurse Practitioner

## 2014-06-17 VITALS — BP 96/64 | HR 84 | Ht 65.5 in | Wt 253.0 lb

## 2014-06-17 DIAGNOSIS — I319 Disease of pericardium, unspecified: Secondary | ICD-10-CM

## 2014-06-17 NOTE — Progress Notes (Signed)
Patient Name: Brandis Wixted Date of Encounter: 06/17/2014  Primary Care Provider:  No PCP Per Patient Primary Cardiologist:  C. Angelena Form, MD   Chief Complaint  54 y/o female s/p admission in December for pericarditis who presents for f/u.  Past Medical History   Past Medical History  Diagnosis Date  . Allergy   . Anxiety   . Anemia   . Substance abuse   . Bipolar 1 disorder   . Chronic pain   . Fibromyalgia   . Acute pericarditis     a. 04/2014 Cath: nl cors, EF 65-70%;  b. 04/2014 Echo: EF 65-70%, mild TR.  . RA (rheumatoid arthritis)     "all over"  . Chronic lower back pain    Past Surgical History  Procedure Laterality Date  . Cesarean section  1993; 1999  . Abdominal hysterectomy  03/2004  . Tarsal metatarsal fusion with weil osteotomy Left 2013  . Gastric bypass  2001  . Shoulder arthroscopy w/ rotator cuff repair Right ~ 2011  . Tonsillectomy    . Left heart catheterization with coronary angiogram N/A 04/23/2014    Normal coronaries and LVF    Allergies  Allergies  Allergen Reactions  . Elavil [Amitriptyline]     Increases heart rate    HPI  55 y/o female with the above problem list.  She was admitted to [facility] in December with chest pain and ST elevation in inferior and anterior leads.  She underwent cath revealing normal cors and nl lv fxn.  Echo confirmed nl EF w/o evidence of an effusion.  She was treated with ibuprofen and also placed on colchicine and protonix.  She missed her initial f/u appt and presents now.  She has been doing well since December.  She did have one, brief, 5 minute episode of c/p in early January but overall has done well.  She's had no pleuritic c/p, dyspnea, n, v, dizziness, syncope, edema, early satiety, or palpitations.  She remains on PPI and colchicine therapy.  Home Medications  Prior to Admission medications   Medication Sig Start Date End Date Taking? Authorizing Provider  b complex vitamins capsule Take 1 capsule by  mouth daily.   Yes Historical Provider, MD  DULoxetine (CYMBALTA) 60 MG capsule Take 120 mg by mouth daily.   Yes Historical Provider, MD  folic acid (FOLVITE) 378 MCG tablet Take 400-2,000 mcg by mouth daily.   Yes Historical Provider, MD  HYDROcodone-acetaminophen (NORCO) 10-325 MG per tablet Take 1 tablet by mouth every 6 (six) hours as needed.   Yes Historical Provider, MD  methotrexate 1 G injection Inject into the muscle once a week. Pt injecting 1 ? weekly   Yes Historical Provider, MD  pregabalin (LYRICA) 200 MG capsule Take 200 mg by mouth 2 (two) times daily.   Yes Historical Provider, MD  risperiDONE (RISPERDAL) 3 MG tablet Take 3 mg by mouth daily.   Yes Historical Provider, MD  Vitamin D, Cholecalciferol, 1000 UNITS TABS Take 2,000 Units by mouth daily.   Yes Historical Provider, MD    Review of Systems  As above, doing well overall. One brief episode of c/p in early January.  She denies palpitations, dyspnea, pnd, orthopnea, n, v, dizziness, syncope, edema, weight gain, or early satiety.  All other systems reviewed and are otherwise negative except as noted above.  Physical Exam  VS:  BP 96/64 mmHg  Pulse 84  Ht 5' 5.5" (1.664 m)  Wt 253 lb (114.76 kg)  BMI 41.45 kg/m2 , BMI Body mass index is 41.45 kg/(m^2). GEN: Well nourished, well developed, in no acute distress. HEENT: normal. Neck: Supple, no JVD, carotid bruits, or masses. Cardiac: RRR, no murmurs, rubs, or gallops. No clubbing, cyanosis, edema.  Radials/DP/PT 2+ and equal bilaterally.  Respiratory:  Respirations regular and unlabored, clear to auscultation bilaterally. GI: Soft, nontender, nondistended, BS + x 4. MS: no deformity or atrophy. Skin: warm and dry, no rash. Neuro:  Strength and sensation are intact. Psych: Normal affect.  Accessory Clinical Findings  ECG - RSR, 84, no acute st/t changes.  Assessment & Plan  1.  Pericarditis:  Pt admitted in early December with c/p and ST elevation with  subsequent cath revealing nl cors.  She was treated empirically for pericarditis with a short course of nsaids and remains on colchicine and PPI.  I've recommended that she finish off her current bottle of colchicine and then discontinue altogether (she has just less than a month less).  As she is off of ibuprofen now, and has been for some time, she may also d/c protonix.   2.  Dispo:  F/U Dr. Angelena Form prn.  Murray Hodgkins, NP 06/17/2014, 3:33 PM

## 2014-06-17 NOTE — Patient Instructions (Addendum)
Your physician has recommended you make the following change in your medication:   Stop protonix and colchicine   Your physician recommends that you schedule a follow-up appointment in:  As needed mcalhany

## 2014-06-27 ENCOUNTER — Encounter: Payer: Self-pay | Admitting: Internal Medicine

## 2014-08-14 ENCOUNTER — Ambulatory Visit: Payer: BLUE CROSS/BLUE SHIELD | Admitting: Internal Medicine

## 2014-10-16 ENCOUNTER — Ambulatory Visit: Payer: BLUE CROSS/BLUE SHIELD | Admitting: Internal Medicine

## 2014-11-30 ENCOUNTER — Encounter (HOSPITAL_BASED_OUTPATIENT_CLINIC_OR_DEPARTMENT_OTHER): Payer: Self-pay | Admitting: *Deleted

## 2014-11-30 ENCOUNTER — Emergency Department (HOSPITAL_BASED_OUTPATIENT_CLINIC_OR_DEPARTMENT_OTHER)
Admission: EM | Admit: 2014-11-30 | Discharge: 2014-11-30 | Disposition: A | Discharge status: Home / Self Care | Payer: BLUE CROSS/BLUE SHIELD | Attending: Emergency Medicine | Admitting: Emergency Medicine

## 2014-11-30 DIAGNOSIS — F419 Anxiety disorder, unspecified: Secondary | ICD-10-CM | POA: Insufficient documentation

## 2014-11-30 DIAGNOSIS — Z9889 Other specified postprocedural states: Secondary | ICD-10-CM | POA: Diagnosis not present

## 2014-11-30 DIAGNOSIS — G8929 Other chronic pain: Secondary | ICD-10-CM | POA: Insufficient documentation

## 2014-11-30 DIAGNOSIS — R519 Headache, unspecified: Secondary | ICD-10-CM

## 2014-11-30 DIAGNOSIS — Z79899 Other long term (current) drug therapy: Secondary | ICD-10-CM | POA: Insufficient documentation

## 2014-11-30 DIAGNOSIS — D649 Anemia, unspecified: Secondary | ICD-10-CM | POA: Insufficient documentation

## 2014-11-30 DIAGNOSIS — H538 Other visual disturbances: Secondary | ICD-10-CM | POA: Diagnosis not present

## 2014-11-30 DIAGNOSIS — R51 Headache: Secondary | ICD-10-CM | POA: Insufficient documentation

## 2014-11-30 DIAGNOSIS — F319 Bipolar disorder, unspecified: Secondary | ICD-10-CM | POA: Diagnosis not present

## 2014-11-30 DIAGNOSIS — Z720 Tobacco use: Secondary | ICD-10-CM | POA: Diagnosis not present

## 2014-11-30 DIAGNOSIS — M797 Fibromyalgia: Secondary | ICD-10-CM | POA: Diagnosis not present

## 2014-11-30 DIAGNOSIS — M069 Rheumatoid arthritis, unspecified: Secondary | ICD-10-CM | POA: Insufficient documentation

## 2014-11-30 MED ORDER — SODIUM CHLORIDE 0.9 % IV SOLN: INTRAVENOUS | Status: DC

## 2014-11-30 MED ORDER — PROMETHAZINE HCL 25 MG/ML IJ SOLN
12.5000 mg | Freq: Once | INTRAMUSCULAR | Status: AC
  Administered 2014-11-30: 12.5 mg via INTRAVENOUS
  Filled 2014-11-30: qty 1

## 2014-11-30 MED ORDER — SODIUM CHLORIDE 0.9 % IV BOLUS (SEPSIS)
1000.0000 mL | Freq: Once | INTRAVENOUS | Status: AC
  Administered 2014-11-30: 1000 mL via INTRAVENOUS

## 2014-11-30 MED ORDER — DEXAMETHASONE SODIUM PHOSPHATE 10 MG/ML IJ SOLN
10.0000 mg | Freq: Once | INTRAMUSCULAR | Status: AC
  Administered 2014-11-30: 10 mg via INTRAVENOUS
  Filled 2014-11-30: qty 1

## 2014-11-30 MED ORDER — DIPHENHYDRAMINE HCL 50 MG/ML IJ SOLN
25.0000 mg | Freq: Once | INTRAMUSCULAR | Status: AC
  Administered 2014-11-30: 25 mg via INTRAVENOUS
  Filled 2014-11-30: qty 1

## 2014-11-30 NOTE — ED Notes (Signed)
Lights dimmed, position of comfort assumed. Safety measures in place, family with pt

## 2014-11-30 NOTE — ED Notes (Signed)
HA onset last weekend, progressively worse, states has had two injections of toradol without relief, denies any N/V, states usually gets this HA once a year

## 2014-11-30 NOTE — ED Provider Notes (Signed)
CSN: [id number]     Arrival date & time 11/30/14  4097 History   First MD Initiated Contact with Patient 11/30/14 1007     Chief Complaint  Patient presents with  . Headache     (Consider location/radiation/quality/duration/timing/severity/associated sxs/prior Treatment) Patient is a 54 y.o. female presenting with headaches. The history is provided by the patient.  Headache Associated symptoms: photophobia   Associated symptoms: no abdominal pain, no back pain, no congestion, no fever, no nausea, no neck pain, no sinus pressure and no vomiting    patient with one-week history of persistent right-sided headache that radiates to the back of the head. Currently 10 out of 10. Patient's had Toradol injections 2. Patient gets headaches like this about once a year. Associated with photophobia no fever no Stiffness no nausea or vomiting. Headache is throbbing in nature 10 out of 10.  Past Medical History  Diagnosis Date  . Allergy   . Anxiety   . Anemia   . Substance abuse   . Bipolar 1 disorder   . Chronic pain   . Fibromyalgia   . Acute pericarditis     a. 04/2014 Cath: nl cors, EF 65-70%;  b. 04/2014 Echo: EF 65-70%, mild TR.  . RA (rheumatoid arthritis)     "all over"  . Chronic lower back pain    Past Surgical History  Procedure Laterality Date  . Cesarean section  1993; 1999  . Abdominal hysterectomy  03/2004  . Tarsal metatarsal fusion with weil osteotomy Left 2013  . Gastric bypass  2001  . Shoulder arthroscopy w/ rotator cuff repair Right ~ 2011  . Tonsillectomy    . Left heart catheterization with coronary angiogram N/A 04/23/2014    Normal coronaries and LVF   Family History  Problem Relation Age of Onset  . Heart attack Maternal Grandfather    History  Substance Use Topics  . Smoking status: Current Every Day Smoker -- 1.00 packs/day for 34 years    Types: Cigarettes  . Smokeless tobacco: Never Used  . Alcohol Use: No   OB History    No data available      Review of Systems  Constitutional: Negative for fever.  HENT: Negative for congestion and sinus pressure.   Eyes: Positive for photophobia.  Respiratory: Negative for shortness of breath.   Cardiovascular: Negative for palpitations.  Gastrointestinal: Negative for nausea, vomiting and abdominal pain.  Genitourinary: Negative for dysuria.  Musculoskeletal: Negative for back pain and neck pain.  Skin: Negative for rash.  Neurological: Positive for headaches.  Hematological: Does not bruise/bleed easily.  Psychiatric/Behavioral: Negative for confusion.      Allergies  Elavil  Home Medications   Prior to Admission medications   Medication Sig Start Date End Date Taking? Authorizing Provider  b complex vitamins capsule Take 1 capsule by mouth daily.    Historical Provider, MD  DULoxetine (CYMBALTA) 60 MG capsule Take 120 mg by mouth daily.    Historical Provider, MD  folic acid (FOLVITE) 353 MCG tablet Take 400-2,000 mcg by mouth daily.    Historical Provider, MD  HYDROcodone-acetaminophen (NORCO) 10-325 MG per tablet Take 1 tablet by mouth every 6 (six) hours as needed.    Historical Provider, MD  methotrexate 1 G injection Inject into the muscle once a week. Pt injecting 1 ? weekly    Historical Provider, MD  pregabalin (LYRICA) 200 MG capsule Take 200 mg by mouth 2 (two) times daily.    Historical Provider, MD  risperiDONE (  RISPERDAL) 3 MG tablet Take 3 mg by mouth daily.    Historical Provider, MD  Vitamin D, Cholecalciferol, 1000 UNITS TABS Take 2,000 Units by mouth daily.    Historical Provider, MD   BP 121/74 mmHg  Pulse 70  Temp(Src) 97.7 F (36.5 C) (Oral)  Resp 18  Ht 5\' 5"  (1.651 m)  Wt 260 lb (117.935 kg)  BMI 43.27 kg/m2  SpO2 95% Physical Exam  Constitutional: She is oriented to person, place, and time. She appears well-developed and well-nourished. No distress.  HENT:  Head: Normocephalic and atraumatic.  Mouth/Throat: Oropharynx is clear and moist.  Eyes:  Conjunctivae and EOM are normal. Pupils are equal, round, and reactive to light.  Neck: Normal range of motion. Neck supple.  Cardiovascular: Normal rate, regular rhythm and normal heart sounds.   Pulmonary/Chest: Breath sounds normal. No respiratory distress.  Abdominal: Soft. Bowel sounds are normal. There is no tenderness.  Musculoskeletal: Normal range of motion.  Neurological: She is alert and oriented to person, place, and time. No cranial nerve deficit. She exhibits normal muscle tone. Coordination normal.  Skin: Skin is warm. No rash noted.  Nursing note and vitals reviewed.   ED Course  Procedures (including critical care time) Labs Review Labs Reviewed - No data to display  Imaging Review No results found.   EKG Interpretation None      MDM   Final diagnoses:  Intractable headache, unspecified chronicity pattern, unspecified headache type    Patient with a bad headache usually once a year. May very well be migraines but no formal diagnosis. Very migraine-like. Patient improved here with migraine cocktail.  Patient nontoxic no acute distress. No history of any injury no fevers nothing to suggest infection.    Fredia Sorrow, MD 11/30/14 [phone]

## 2014-11-30 NOTE — ED Notes (Signed)
Up to bathroom, gait very steady, min assistance required

## 2014-11-30 NOTE — Discharge Instructions (Signed)
The home and rest in a darkened room. Hopefully will improve over the next 24 hours. Return for any new or worse symptoms.

## 2014-11-30 NOTE — ED Notes (Signed)
Denies N/V, states has sensitivity to light

## 2014-11-30 NOTE — ED Notes (Addendum)
FAST assessment: negative 

## 2015-04-17 ENCOUNTER — Ambulatory Visit (INDEPENDENT_AMBULATORY_CARE_PROVIDER_SITE_OTHER): Payer: BLUE CROSS/BLUE SHIELD | Admitting: Podiatry

## 2015-04-17 ENCOUNTER — Encounter: Payer: Self-pay | Admitting: Podiatry

## 2015-04-17 ENCOUNTER — Ambulatory Visit (INDEPENDENT_AMBULATORY_CARE_PROVIDER_SITE_OTHER): Payer: BLUE CROSS/BLUE SHIELD

## 2015-04-17 VITALS — BP 93/54 | HR 77 | Resp 16

## 2015-04-17 DIAGNOSIS — M779 Enthesopathy, unspecified: Secondary | ICD-10-CM | POA: Diagnosis not present

## 2015-04-17 DIAGNOSIS — M79671 Pain in right foot: Secondary | ICD-10-CM

## 2015-04-17 DIAGNOSIS — M79672 Pain in left foot: Secondary | ICD-10-CM | POA: Diagnosis not present

## 2015-04-17 MED ORDER — TRIAMCINOLONE ACETONIDE 10 MG/ML IJ SUSP
10.0000 mg | Freq: Once | INTRAMUSCULAR | Status: AC
  Administered 2015-04-17: 10 mg

## 2015-04-18 NOTE — Progress Notes (Signed)
Subjective:     Patient ID: [name], female   DOB: [birth year], 54 y.o.   MRN: [id number]  HPI patient presents with pain in both feet top of the feet and also presents with moderate flattening the arch and pain in the plantar feet. 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 Patient will be seen back when ready and earlier if needed

## 2015-06-10 ENCOUNTER — Ambulatory Visit: Payer: BLUE CROSS/BLUE SHIELD | Admitting: *Deleted

## 2015-06-10 DIAGNOSIS — R52 Pain, unspecified: Secondary | ICD-10-CM

## 2015-06-10 NOTE — Progress Notes (Signed)
Patient ID: [name], female   DOB: 10/27/1960, 55 y.o.   MRN: [id number] Patient presents for orthotic pick up.  Verbal and written break in and wear instructions given.  Patient will follow up in 4 weeks if symptoms worsen or fail to improve.

## 2015-06-10 NOTE — Patient Instructions (Signed)

## 2015-10-02 ENCOUNTER — Encounter: Payer: Self-pay | Admitting: Gastroenterology

## 2015-10-07 ENCOUNTER — Encounter: Payer: Self-pay | Admitting: Gastroenterology

## 2015-11-05 ENCOUNTER — Telehealth: Payer: Self-pay | Admitting: Nurse Practitioner

## 2015-11-05 NOTE — Telephone Encounter (Signed)
I spoke with pt and appt made for her to see Dr. Angelena Form on June 23,2017 at 2:30

## 2015-11-05 NOTE — Telephone Encounter (Signed)
New message    Pt c/o swelling: STAT is pt has developed SOB within 24 hours  1. How long have you been experiencing swelling? Per pt it's been a couple of weeks 2. Where is the swelling located? Both legs,ankles, and feet  3.  Are you currently taking a "fluid pill"? lasix  4.  Are you currently SOB? no  5.  Have you traveled recently?no   The pt is due to see [name] for next Tuesday and wants to know from nursing stand point what to do

## 2015-11-05 NOTE — Telephone Encounter (Signed)
Note this is a patient previously recommended to f/u w Dr. Angelena Form. Will route to [facility] triage for assistance.

## 2015-11-05 NOTE — Telephone Encounter (Signed)
Returned call. Pt notes redness and swelling, mainly in right foot but also noticeable in left. She states this is painful, and she has had swelling for several weeks, but it is noticeably worse in the last 1-2 days.  She denies SOB. She takes lasix "2 pills" a day (not listed on chart - does not recall dose).  She has been followed by podiatry - has a return visit w/ them scheduled Friday. Is scheduled to see Ignacia Bayley on Tuesday. Wants to know if she can be seen sooner. Aware our shedulers are gone for the day - I will inquire tomorrow on flex availability and return her call. Pt voiced thanks.

## 2015-11-07 ENCOUNTER — Ambulatory Visit (INDEPENDENT_AMBULATORY_CARE_PROVIDER_SITE_OTHER): Payer: BLUE CROSS/BLUE SHIELD | Admitting: Podiatry

## 2015-11-07 ENCOUNTER — Ambulatory Visit (INDEPENDENT_AMBULATORY_CARE_PROVIDER_SITE_OTHER): Payer: BLUE CROSS/BLUE SHIELD | Admitting: Cardiovascular Disease

## 2015-11-07 ENCOUNTER — Encounter: Payer: Self-pay | Admitting: Cardiovascular Disease

## 2015-11-07 ENCOUNTER — Encounter: Payer: Self-pay | Admitting: Podiatry

## 2015-11-07 VITALS — BP 100/80 | HR 94 | Ht 65.5 in | Wt 267.6 lb

## 2015-11-07 VITALS — BP 91/61 | HR 112 | Resp 16

## 2015-11-07 DIAGNOSIS — R52 Pain, unspecified: Secondary | ICD-10-CM

## 2015-11-07 DIAGNOSIS — R600 Localized edema: Secondary | ICD-10-CM | POA: Diagnosis not present

## 2015-11-07 DIAGNOSIS — M779 Enthesopathy, unspecified: Secondary | ICD-10-CM

## 2015-11-07 DIAGNOSIS — M722 Plantar fascial fibromatosis: Secondary | ICD-10-CM | POA: Diagnosis not present

## 2015-11-07 MED ORDER — TRIAMCINOLONE ACETONIDE 10 MG/ML IJ SUSP
10.0000 mg | Freq: Once | INTRAMUSCULAR | Status: AC
  Administered 2015-11-07: 10 mg

## 2015-11-07 NOTE — Patient Instructions (Addendum)
Medication Instructions:  Your physician recommends that you continue on your current medications as directed. Please refer to the Current Medication list given to you today.   Labwork: none  Testing/Procedures: Your physician has requested that you have a lower  extremity venous duplex. This test is an ultrasound of the veins in the legs.. It looks at venous blood flow that carries blood from the heart to the legs.. Allow one hour for a Lower Venous exam. .. There are no restrictions or special instructions.     Follow-Up: Your physician recommends that you schedule a follow-up appointment in: 4-6 weeks. --Scheduled for August 3,2017 at 1:45    Any Other Special Instructions Will Be Listed Below (If Applicable).     If you need a refill on your cardiac medications before your next appointment, please call your pharmacy.

## 2015-11-07 NOTE — Progress Notes (Signed)
Chief Complaint  Patient presents with  . Follow-up    swelling in feet and ankles      History of Present Illness: 55 yo female with history of bipolar disorder, former substance abuse, RA, tobacco abuse who is here today for cardiac follow up. She had been seen remotely by Dr. Rollene Fare for palpitations. I met her in December 2015 at [facility] when she was admitted for chest pain. Her EKG showed ST changes so urgent cath was performed. Her cardiac cath showed normal coronary arteries. Echo with normal LV function and no evidence of valve disease or pericardial effusion. She was presumed to have pericarditis and was treated with colchicine and ibuprofen.   She is here today for follow up. She called our office this week with c/o swelling in both ankles for one month. She has been started on Lasix 40 mg daily in primary care with little resolution of swelling. She has been trying to elevate her feet. She has compression stockings but has not been able to wear. No fevers or chills. Redness over legs has not changed over last few weeks.   Primary Care Physician: Rick Duff  Past Medical History  Diagnosis Date  . Allergy   . Anxiety   . Anemia   . Substance abuse   . Bipolar 1 disorder ([place])   . Chronic pain   . Fibromyalgia   . Acute pericarditis     a. 04/2014 Cath: nl cors, EF 65-70%;  b. 04/2014 Echo: EF 65-70%, mild TR.  . RA (rheumatoid arthritis) ([place])     "all over"  . Chronic lower back pain     Past Surgical History  Procedure Laterality Date  . Cesarean section  1993; 1999  . Abdominal hysterectomy  03/2004  . Tarsal metatarsal fusion with weil osteotomy Left 2013  . Gastric bypass  2001  . Shoulder arthroscopy w/ rotator cuff repair Right ~ 2011  . Tonsillectomy    . Left heart catheterization with coronary angiogram N/A 04/23/2014    Normal coronaries and LVF    Current Outpatient Prescriptions  Medication Sig Dispense Refill  . b complex vitamins capsule Take 1 capsule  by mouth daily.    . DULoxetine (CYMBALTA) 60 MG capsule Take 120 mg by mouth daily.    . folic acid (FOLVITE) 382 MCG tablet Take 400-2,000 mcg by mouth daily.    . furosemide (LASIX) 40 MG tablet Take 40 mg by mouth daily.    Marland Kitchen levothyroxine (SYNTHROID, LEVOTHROID) 50 MCG tablet Take 50 mcg by mouth daily before breakfast.    . methotrexate 1 G injection Inject into the muscle once a week. Pt injecting 1 ? weekly    . pantoprazole (PROTONIX) 20 MG tablet Take 20 mg by mouth daily.    . pregabalin (LYRICA) 200 MG capsule Take 200 mg by mouth 2 (two) times daily.    . risperiDONE (RISPERDAL) 3 MG tablet Take 3 mg by mouth daily.    . Vitamin D, Cholecalciferol, 1000 UNITS TABS Take 2,000 Units by mouth daily.     No current facility-administered medications for this visit.    Allergies  Allergen Reactions  . Elavil [Amitriptyline]     Increases heart rate    Social History   Social History  . Marital Status: Divorced    Spouse Name: N/A  . Number of Children: N/A  . Years of Education: N/A   Occupational History  . Not on file.   Social History Main Topics  .  Smoking status: Current Every Day Smoker -- 1.00 packs/day for 34 years    Types: Cigarettes  . Smokeless tobacco: Never Used  . Alcohol Use: No  . Drug Use: No  . Sexual Activity: Not Currently   Other Topics Concern  . Not on file   Social History Narrative    Family History  Problem Relation Age of Onset  . Heart attack Maternal Grandfather     Review of Systems:  As stated in the HPI and otherwise negative.   BP 100/80 mmHg  Pulse 94  Ht 5' 5.5" (1.664 m)  Wt 267 lb 9.6 oz (121.383 kg)  BMI 43.84 kg/m2  Physical Examination: General: Well developed, well nourished, NAD HEENT: OP clear, mucus membranes moist SKIN: warm, dry. No rashes. Neuro: No focal deficits Musculoskeletal: Muscle strength 5/5 all ext Psychiatric: Mood and affect normal Neck: No JVD, no carotid bruits, no thyromegaly, no  lymphadenopathy. Lungs:Clear bilaterally, no wheezes, rhonci, crackles Cardiovascular: Regular rate and rhythm. No murmurs, gallops or rubs. Abdomen:Soft. Bowel sounds present. Non-tender.  Extremities: 1-2+ bilateral lower extremity edema. Pulses are 2 + in the bilateral DP/PT.  Cardiac cath 04/23/14:  Left main: No obstructive disease.  Left Anterior Descending Artery: Large caliber vessel that courses to the apex. Large caliber diagonal branch. No obstructive disease.  Circumflex Artery: Large caliber vessel with moderate caliber first obtuse marginal branch and large caliber second obtuse marginal branch. No obstructive disease.  Right Coronary Artery: Large dominant vessel with no obstructive disease.  Left Ventricular Angiogram: LVEF=65-70%.   Echo December 2016: Left ventricle: The cavity size was normal. Systolic function was vigorous. The estimated ejection fraction was in the range of 65% to 70%. Wall motion was normal; there were no regional wall motion abnormalities. Left ventricular diastolic function parameters were normal. - Aortic valve: Trileaflet; normal thickness leaflets. There was no regurgitation. - Aortic root: The aortic root was normal in size. - Mitral valve: Structurally normal valve. - Left atrium: The atrium was mildly dilated. - Tricuspid valve: There was mild regurgitation. - Pulmonic valve: There was no significant regurgitation. - Pulmonary arteries: Systolic pressure was within the normal range. - Inferior vena cava: The vessel was normal in size. - Pericardium, extracardiac: A trivial pericardial effusion was identified posterior to the heart. Features were not consistent with tamponade physiology.  Impressions: - Mild tricuspid regurgitation, otherwise normal study.  EKG:  EKG is ordered today. The ekg ordered today demonstrates NSR, rate 94 bpm.   Recent Labs: No results found for requested labs within last 365 days.      Wt Readings from Last 3 Encounters:  11/07/15 267 lb 9.6 oz (121.383 kg)  11/30/14 260 lb (117.935 kg)  06/17/14 253 lb (114.76 kg)     Other studies Reviewed: Additional studies/ records that were reviewed today include: . Review of the above records demonstrates:    Assessment and Plan:   1. Bilateral LE edema: She has normal LV systolic function. There could be a component of diastolic CHF. She is being treated for LE edema by her primary care doctor. She is on Lasix 40 mg daily. Will ask her to take Lasix 40 mg po BID for one week with banana each day. Will arrange LE venous dopplers to exclude DVT. Elevate legs, avoid salt, wear compression stockings. Seek medical attention if her legs become red and inflammed or if she has fevers.   Current medicines are reviewed at length with the patient today.  The patient does  not have concerns regarding medicines.  The following changes have been made:  no change  Labs/ tests ordered today include:   Orders Placed This Encounter  Procedures  . EKG 12-Lead    Disposition:   FU with me in 6 weeks   Signed, Lauree Chandler, MD 11/07/2015 4:11 PM    [facility] [place], [place],   [address] Phone: [phone]; Fax: [phone]

## 2015-11-09 NOTE — Progress Notes (Signed)
Subjective:     Patient ID: [name], female   DOB: 10-Dec-1960, 55 y.o.   MRN: [id number]  HPI patient presents stating the top of my feet are bothering me again and I know that my weight is not helpful and my fibromyalgia does not help   Review of Systems     Objective:   Physical Exam Vascular status intact muscle strength adequate with patient found to have exquisite discomfort on the dorsum of both feet in the midtarsal joint with inflammation and fluid buildup    Assessment:     Tendinitis was found to be quite inflamed dorsally with possibility for underlying arthritis    Plan:     Reinjected the dorsal tendon complex bilateral 3 mg Kenalog 5 mg Xylocaine and if it does not show moderate improvement we'll need to consider CT scans with surgical fusion which may be necessary at one point in future

## 2015-11-11 ENCOUNTER — Ambulatory Visit: Payer: BLUE CROSS/BLUE SHIELD | Admitting: Nurse Practitioner

## 2015-11-12 ENCOUNTER — Ambulatory Visit (HOSPITAL_COMMUNITY): Admission: RE | Admit: 2015-11-12 | Payer: BLUE CROSS/BLUE SHIELD | Source: Ambulatory Visit

## 2015-11-20 ENCOUNTER — Encounter (HOSPITAL_COMMUNITY): Payer: Self-pay | Admitting: Cardiovascular Disease

## 2015-11-21 ENCOUNTER — Ambulatory Visit (INDEPENDENT_AMBULATORY_CARE_PROVIDER_SITE_OTHER): Payer: BLUE CROSS/BLUE SHIELD | Admitting: Podiatry

## 2015-11-21 ENCOUNTER — Encounter: Payer: BLUE CROSS/BLUE SHIELD | Admitting: Gastroenterology

## 2015-11-21 ENCOUNTER — Encounter: Payer: Self-pay | Admitting: Podiatry

## 2015-11-21 VITALS — BP 91/65 | HR 103 | Resp 16

## 2015-11-21 DIAGNOSIS — M05771 Rheumatoid arthritis with rheumatoid factor of right ankle and foot without organ or systems involvement: Secondary | ICD-10-CM | POA: Diagnosis not present

## 2015-11-21 DIAGNOSIS — M05772 Rheumatoid arthritis with rheumatoid factor of left ankle and foot without organ or systems involvement: Secondary | ICD-10-CM | POA: Diagnosis not present

## 2015-11-21 DIAGNOSIS — M779 Enthesopathy, unspecified: Secondary | ICD-10-CM | POA: Diagnosis not present

## 2015-11-21 MED ORDER — TRIAMCINOLONE ACETONIDE 10 MG/ML IJ SUSP
10.0000 mg | Freq: Once | INTRAMUSCULAR | Status: AC
  Administered 2015-11-21: 10 mg

## 2015-11-23 NOTE — Progress Notes (Signed)
Subjective:     Patient ID: Brynnli Anzalone, female   DOB: June 24, 1960, 55 y.o.   MRN: [id number]  HPI patient presents stating she still having pain in her feet but she's having more pain now in her right ankle and she also is having trouble with systemic arthritis and is seen a rheumatologist and does have obesity which is a complicating factor   Review of Systems     Objective:   Physical Exam Neurovascular status intact muscle strength adequate range of motion within normal limits with patient noted to have discomfort in the sinus tarsi right continued forefoot discomfort bilateral dorsal surface localized in nature    Assessment:     Dorsal tendinitis and tendinitis of the sinus tarsi right    Plan:     Instructed on anti-inflammatories injected the sinus tarsi right and we may consider CT scan once we get her through her rheumatology and her pain management consults

## 2015-11-27 ENCOUNTER — Telehealth: Payer: Self-pay | Admitting: *Deleted

## 2015-11-27 NOTE — Telephone Encounter (Signed)
Pt no showed for venous doppler study. I placed call to her to see if she would like to reschedule this.  She states she is going to see [employer] and does not want to reschedule doppler study.

## 2015-12-04 ENCOUNTER — Telehealth: Payer: Self-pay | Admitting: *Deleted

## 2015-12-04 DIAGNOSIS — M779 Enthesopathy, unspecified: Secondary | ICD-10-CM

## 2015-12-04 DIAGNOSIS — R0989 Other specified symptoms and signs involving the circulatory and respiratory systems: Secondary | ICD-10-CM

## 2015-12-04 NOTE — Telephone Encounter (Addendum)
Pt states Dr. Estanislado Pandy is doing a MRI on her right foot and she wanted to know if Dr. Paulla Dolly wanted to MRI the left.  I reviewed charting and Dr. Paulla Dolly stated he may order CT Scans.  Pt states she will be having venous studies 12/18/2015. I told pt I would inform Dr. Paulla Dolly of the question concerning the MRI. I told pt Dr. Estanislado Pandy may have felt the symptoms of both feet were similar and only ordered the MRI of the Right. 12/05/2015-Dr. Regal ordered left ankle MRI without contrast suspect capsulitis, suspect rheumatoid arthritis. BCBS of [place] DOES NOT REQUIRE PRIOR AUTHORIZATION FOR [address]86 LEFT ANKLE, REFERENCE # [id number] PM. Orders faxed to [facility]. 12/12/2015-Informed pt the [employer]BS of [place] didn't require prior authorization at this time and that information had been faxed to [employer]. 12/29/2015-DrJacqualyn Posey referred to [facility] for arterial dopplers. Faxed to [facility]. 12/30/2015-Pt states she would like to have her surgery scheduled for next Wednesday. I spoke with pt and told her Dr. Jacqualyn Posey would need to have the doppler results at the latest Monday, pt states understanding. 01/02/2016-Pt states the last medication Dr. Jacqualyn Posey put her on 12/29/2015 is not helping the pain, request Vicodin. Informed pt she could pick up the rx in the [place] office.

## 2015-12-05 NOTE — Telephone Encounter (Signed)
That should be fine for A start

## 2015-12-09 ENCOUNTER — Encounter: Payer: Self-pay | Admitting: *Deleted

## 2015-12-10 ENCOUNTER — Ambulatory Visit (INDEPENDENT_AMBULATORY_CARE_PROVIDER_SITE_OTHER): Payer: BLUE CROSS/BLUE SHIELD | Admitting: Gastroenterology

## 2015-12-10 ENCOUNTER — Encounter: Payer: Self-pay | Admitting: Gastroenterology

## 2015-12-10 ENCOUNTER — Telehealth: Payer: Self-pay | Admitting: Gastroenterology

## 2015-12-10 VITALS — BP 100/70 | HR 92 | Ht 64.5 in | Wt 266.0 lb

## 2015-12-10 DIAGNOSIS — Z1211 Encounter for screening for malignant neoplasm of colon: Secondary | ICD-10-CM

## 2015-12-10 DIAGNOSIS — R1013 Epigastric pain: Secondary | ICD-10-CM

## 2015-12-10 DIAGNOSIS — Z9889 Other specified postprocedural states: Secondary | ICD-10-CM | POA: Diagnosis not present

## 2015-12-10 DIAGNOSIS — Z9884 Bariatric surgery status: Secondary | ICD-10-CM

## 2015-12-10 MED ORDER — SUCRALFATE 1 GM/10ML PO SUSP: 1.0000 g | Freq: Four times a day (QID) | ORAL | 1 refills | Status: DC | PRN

## 2015-12-10 MED ORDER — NA SULFATE-K SULFATE-MG SULF 17.5-3.13-1.6 GM/177ML PO SOLN: 1.0000 | Freq: Once | ORAL | 0 refills | Status: AC

## 2015-12-10 MED ORDER — OMEPRAZOLE 40 MG PO CPDR: 40.0000 mg | DELAYED_RELEASE_CAPSULE | Freq: Two times a day (BID) | ORAL | 3 refills | Status: DC

## 2015-12-10 MED ORDER — NA SULFATE-K SULFATE-MG SULF 17.5-3.13-1.6 GM/177ML PO SOLN: 1.0000 | Freq: Once | ORAL | 0 refills | Status: DC

## 2015-12-10 MED ORDER — PANTOPRAZOLE SODIUM 40 MG PO PACK: 40.0000 mg | PACK | Freq: Two times a day (BID) | ORAL | 6 refills | Status: DC

## 2015-12-10 NOTE — Progress Notes (Signed)
HPI :  55 y/o female with a history of obesity s/p gastric bypass, as well as history of chronic pain syndrome, here for a new patient evaluation of epigastric pain and colon cancer screening.   She reports some pain in her abdomen for the past year. Pain is in the epigastric area. Pain can be relieved with drinking. She reports the pain can come and go and is intermittent, she will have it several times per week. She thinks sometimes eating can elicit her to have pain. She has some nocturnal symptoms at times. Pain can also occur sporadically without eating. Pain can radiate into her entire upper abdomen. She occasionally has some vomiting with this. She reports rare postprandial vomiting. She has some nausea periodically. She thinks her weight is stable. She had a gastric bypass in 2001, she is not sure what type she had, she thinks "the common one" / Roux-en-Y. She takes ibuprofen and tylenol for OA / RA / fibromyalgia. She takes ibuprofen roughly 8 times per day and has been doing so longstanding. She is taking pantoprazole tablet 40mg  twice daily, she thinks she has been on this dose for a little while. Her chart however shows only 20mg  tablet. She reports a remote history of having an ulcer. Last EGD in 2003 showed "post-surgical anatomy".  No prior colon cancer screening. NO FH of colon cancer. No blood in the stools. No constipation or diarrhea. She is a current tobacco user.   Recent labs 11/11/15: WBC 8.2, Hb 12.4, plt 362, MCV 93 BUN 13, Cr 0.92 AP 64, ALT 14, AST 17, T bil 0.2, alb 4.1 TSH 8.7  EGD 2003 - ? Barrett's on gross exam, biopsies negative for Barrett's, evidence of gastric surgery without further clarification.  Past Medical History:  Diagnosis Date  . Acute pericarditis    a. 04/2014 Cath: nl cors, EF 65-70%;  b. 04/2014 Echo: EF 65-70%, mild TR.  Marland Kitchen Allergy   . Anal fissure   . Anemia   . Anxiety   . Bipolar 1 disorder ([place])   . Chronic lower back pain   . Chronic  pain   . Fibromyalgia   . GERD (gastroesophageal reflux disease)   . HLD (hyperlipidemia)   . Hypothyroidism   . RA (rheumatoid arthritis) ([place])    "all over"  . Substance abuse      Past Surgical History:  Procedure Laterality Date  . ABDOMINAL HYSTERECTOMY  03/2004  . [place]; 1999  . GASTRIC BYPASS  2001  . LEFT HEART CATHETERIZATION WITH CORONARY ANGIOGRAM N/A 04/23/2014   Normal coronaries and LVF  . SHOULDER ARTHROSCOPY W/ ROTATOR CUFF REPAIR Right ~ 2011  . TARSAL METATARSAL FUSION WITH WEIL OSTEOTOMY Left 2013  . TONSILLECTOMY     Family History  Problem Relation Age of Onset  . Colon polyps Father   . Colon polyps Sister   . Heart attack Maternal Grandfather   . Colon polyps Paternal Grandmother    Social History  Substance Use Topics  . Smoking status: Current Every Day Smoker    Packs/day: 1.00    Years: 34.00    Types: Cigarettes  . Smokeless tobacco: Never Used  . Alcohol use No   Current Outpatient Prescriptions  Medication Sig Dispense Refill  . b complex vitamins capsule Take 1 capsule by mouth daily.    . DULoxetine (CYMBALTA) 60 MG capsule Take 120 mg by mouth daily.    . folic acid (FOLVITE) A999333 MCG tablet  Take 400-2,000 mcg by mouth daily.    . furosemide (LASIX) 40 MG tablet Take 40 mg by mouth daily.    Marland Kitchen levothyroxine (SYNTHROID, LEVOTHROID) 50 MCG tablet Take 50 mcg by mouth daily before breakfast.    . methotrexate 1 G injection Inject into the muscle once a week. Pt injecting 1 ? weekly    . pantoprazole (PROTONIX) 20 MG tablet Take 20 mg by mouth daily.    . pregabalin (LYRICA) 200 MG capsule Take 200 mg by mouth 2 (two) times daily.    . risperiDONE (RISPERDAL) 3 MG tablet Take 3 mg by mouth daily.    . traZODone (DESYREL) 100 MG tablet Take 200 mg by mouth at bedtime.    . Vitamin D, Cholecalciferol, 1000 UNITS TABS Take 2,000 Units by mouth daily.     No current facility-administered medications for this visit.     Allergies  Allergen Reactions  . Elavil [Amitriptyline]     Increases heart rate     Review of Systems: All systems reviewed and negative except where noted in HPI.   Labs as outlined in HPI    Physical Exam: BP 100/70 (BP Location: Left Arm, Patient Position: Sitting, Cuff Size: Large)   Pulse 92   Ht 5' 4.5" (1.638 m) Comment: height measured without shoes  Wt 266 lb (120.7 kg)   BMI 44.95 kg/m  Constitutional: Pleasant,well-developed, female in no acute distress. HEENT: Normocephalic and atraumatic. Conjunctivae are normal. No scleral icterus. Neck supple.  Cardiovascular: Normal rate, regular rhythm.  Pulmonary/chest: Effort normal and breath sounds normal. No wheezing, rales or rhonchi. Abdominal: Soft, nondistended, protuberant, mild epigastric TTP without rebound or guarding. Bowel sounds active throughout.   Extremities: no edema Lymphadenopathy: No cervical adenopathy noted. Neurological: Alert and oriented to person place and time. Skin: Skin is warm and dry. No rashes noted. Psychiatric: Normal mood and affect. Behavior is normal.   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 She takes a significant amount of NSAIDs daily for chronic pain, suspect it could be related to her symptoms and concerned about underlying PUD NSAID gastritis in the setting of gastric bypass anatomy. I discussed NSAIDs with her in general, to include risks of not only PUD but also CKD and cardiovascular risks. Recommend she minimize and stop NSAIDs if possible, and she may wish to see pain management for discussion of long term options. Unclear what dose of protonix she is taking (pharmacy reports 20mg , not 40mg ), but will switch her protonix to capsule form and take 40mg  twice daily, but she should crack open capsules prior to ingestion to allow better  absorption given her gastric bypass anatomy. I will also provide her liquid carafate to take PRN. To further evaluate this issue I otherwise recommend an EGD. At that time, we can also perform her screening colonoscopy. I discussed risks / benefits of endoscopy and anesthesia with her, and she wished to proceed. Further recommendations pending the results.   [place] Cellar, MD [facility] Pager [phone]

## 2015-12-10 NOTE — Telephone Encounter (Signed)
Pantoprazole does not come in capsules so Dr Havery Moros says that he would like patient to take omeprazole 40 mg twice daily, open contents and place in applesauce to take. Rx sent and pharmacy advised to d/c pantoprazole script.

## 2015-12-10 NOTE — Patient Instructions (Signed)
If you are age 53 or older, your body mass index should be between 23-30. Your Body mass index is 44.95 kg/m. If this is out of the aforementioned range listed, please consider follow up with your Primary Care Provider.  If you are age 97 or younger, your body mass index should be between 19-25. Your Body mass index is 44.95 kg/m. If this is out of the aformentioned range listed, please consider follow up with your Primary Care Provider.   You have been scheduled for a colonoscopy. Please follow written instructions given to you at your visit today.  Please pick up your prep supplies at the pharmacy within the next 1-3 days. If you use inhalers (even only as needed), please bring them with you on the day of your procedure. Your physician has requested that you go to www.startemmi.com and enter the access code given to you at your visit today. This web site gives a general overview about your procedure. However, you should still follow specific instructions given to you by our office regarding your preparation for the procedure.  Thank you for choosing [facility]  Dr Chauncey Cruel. [place]

## 2015-12-18 ENCOUNTER — Ambulatory Visit: Payer: BLUE CROSS/BLUE SHIELD | Admitting: Cardiovascular Disease

## 2015-12-20 ENCOUNTER — Encounter: Payer: Self-pay | Admitting: Podiatry

## 2015-12-24 ENCOUNTER — Ambulatory Visit (INDEPENDENT_AMBULATORY_CARE_PROVIDER_SITE_OTHER): Payer: BLUE CROSS/BLUE SHIELD | Admitting: Podiatry

## 2015-12-24 ENCOUNTER — Encounter: Payer: Self-pay | Admitting: Podiatry

## 2015-12-24 ENCOUNTER — Ambulatory Visit (INDEPENDENT_AMBULATORY_CARE_PROVIDER_SITE_OTHER): Payer: BLUE CROSS/BLUE SHIELD

## 2015-12-24 VITALS — BP 96/70 | HR 101 | Resp 16

## 2015-12-24 DIAGNOSIS — M79672 Pain in left foot: Secondary | ICD-10-CM | POA: Diagnosis not present

## 2015-12-24 DIAGNOSIS — M79671 Pain in right foot: Secondary | ICD-10-CM

## 2015-12-24 DIAGNOSIS — M19079 Primary osteoarthritis, unspecified ankle and foot: Secondary | ICD-10-CM | POA: Diagnosis not present

## 2015-12-24 DIAGNOSIS — R52 Pain, unspecified: Secondary | ICD-10-CM | POA: Diagnosis not present

## 2015-12-24 DIAGNOSIS — M779 Enthesopathy, unspecified: Secondary | ICD-10-CM

## 2015-12-24 MED ORDER — HYDROCODONE-ACETAMINOPHEN 10-325 MG PO TABS: 1.0000 | ORAL_TABLET | Freq: Three times a day (TID) | ORAL | 0 refills | Status: DC | PRN

## 2015-12-24 NOTE — Progress Notes (Signed)
Subjective:     Patient ID: [name], female   DOB: 02/27/1961, 55 y.o.   MRN: [id number]  HPI patient states I'm having so much pain in my forefoot right over left foot and I am only getting 1 or 2 days relief with injections and the topicals are not helping and orthotics or not making a difference. I would like to discuss my MRIs and I know I'm going to need surgery   Review of Systems     Objective:   Physical Exam Neurovascular status intact muscle strength is adequate with patient found to have intense discomfort at the second metatarsocuneiform joint third metatarsocuneiform joint and talonavicular joint right. I also noted edema around this area +1 pitting in nature and there is moderate discomfort in the lateral part of the foot which may be due to disease or may be due to gait change. 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 Pain in the left foot which may be compensatory or related to gait structure obesity or her history of rheumatoid arthritis    Plan:     H&P and x-rays taken and reviewed with patient. I spent a great deal time going over this and that most likely this will take fusion procedures and I explained that the patient and she wants to have this done and is motivated and I did discuss with her the difficulty of this surgery chances for nonunion and approximate 1 year recovery. I will discuss this case with Dr. Jacqualyn Posey and Amalia Hailey and we will make a decision as to what we'll be appropriate but I did discuss that they will be the ones performing the  procedure  X-ray report indicates there is narrowing of the second and third metatarsal cuneiform joint right along with the talonavicular right and there is a fusion talonavicular left with staple that appears to have fractured at this time with no indications of overt arthritis of the midfoot left

## 2015-12-25 ENCOUNTER — Ambulatory Visit: Payer: BLUE CROSS/BLUE SHIELD | Admitting: Podiatry

## 2015-12-26 ENCOUNTER — Ambulatory Visit: Payer: BLUE CROSS/BLUE SHIELD | Admitting: Podiatry

## 2015-12-29 ENCOUNTER — Encounter: Payer: Self-pay | Admitting: Podiatry

## 2015-12-29 ENCOUNTER — Ambulatory Visit (INDEPENDENT_AMBULATORY_CARE_PROVIDER_SITE_OTHER): Payer: BLUE CROSS/BLUE SHIELD | Admitting: Podiatry

## 2015-12-29 DIAGNOSIS — M79671 Pain in right foot: Secondary | ICD-10-CM

## 2015-12-29 DIAGNOSIS — M19079 Primary osteoarthritis, unspecified ankle and foot: Secondary | ICD-10-CM | POA: Diagnosis not present

## 2015-12-29 DIAGNOSIS — M79672 Pain in left foot: Secondary | ICD-10-CM

## 2015-12-29 MED ORDER — OXYCODONE-ACETAMINOPHEN 5-325 MG PO TABS: 1.0000 | ORAL_TABLET | Freq: Three times a day (TID) | ORAL | 0 refills | Status: DC | PRN

## 2015-12-29 NOTE — Patient Instructions (Signed)

## 2015-12-29 NOTE — Progress Notes (Signed)
Subjective: -year-old female presents the office they for surgical consultation for pain to her right foot great left foot. She points on the midfoot which is the majority pain. She says been ongoing for quite some time she is tried shoe changes, inserts, offloading as well as other treatment for any relief of symptoms. This time should discuss surgical intervention. She states that she had a remote history of a sprain to her foot 2 which may result in some of the pain. She denies any recent injury. The area does swell occasionally. Denies any redness.Denies any systemic complaints such as fevers, chills, nausea, vomiting. No acute changes since last appointment, and no other complaints at this time.   Objective: AAO x3, NAD DP/PT pulses palpable bilaterally, CRT less than 3 seconds There is edema along the dorsal aspect of the foot without any erythema or increase in warmth. There is tenderness on Lisfranc joint along the medial midfoot. There is no area pinpoint bony tenderness or pain the vibratory sensation. There is no other areas of tenderness of the right foot. The left foot there doesn't be tenderness along the surgical site previously. She does get some discomfort to her ankles as well. She states this started more recently. No edema, erythema, increase in warmth to bilateral lower extremities.  No open lesions or pre-ulcerative lesions.  No pain with calf compression, swelling, warmth, erythema  Assessment: Right foot arthritis, left foot nonunion  Plan: -All treatment options discussed with the patient including all alternatives, risks, complications.  -X-rays and MRI results were discussed the patient. I discussed there are both conservative and surgical treatment in detail. At this time should proceed with surgical intervention. I discussed with her the strength arthrodesis most likely clean the second third metatarsocuneiform joints as well as the second metatarsal medial cuneiform  joint as well as possibly the first metatarsal. Informed joint. Also discussed with her navicular cuneiform joint fusion. I discussed with the surgery as well as the postoperative course as well as the risks of surgery. She understands these risks and wishes to proceed. She does currently smoke night I want discussion with her regards to smoking and postoperative complications. She is continued to cut back and try to quit smoking prior to surgery and throughout the postoperative course. -The incision placement as well as the postoperative course was discussed with the patient. I discussed risks of the surgery which include, but not limited to, infection, bleeding, pain, swelling, need for further surgery, delayed or nonhealing, painful or ugly scar, numbness or sensation changes, over/under correction, recurrence, transfer lesions, further deformity, hardware failure, DVT/PE, loss of toe/foot. Patient understands these risks and wishes to proceed with surgery. The surgical consent was reviewed with the patient all 3 pages were signed. No promises or guarantees were given to the outcome of the procedure. All questions were answered to the best of my ability. Before the surgery the patient was encouraged to call the office if there is any further questions. The surgery will be performed at the [facility] on an outpatient basis. -Arterial studies prior to surgery.  Celesta Gentile, DPM -Patient encouraged to call the office with any questions, concerns, change in symptoms.

## 2016-01-01 ENCOUNTER — Encounter (HOSPITAL_COMMUNITY): Payer: Self-pay

## 2016-01-01 ENCOUNTER — Other Ambulatory Visit: Payer: Self-pay | Admitting: Podiatry

## 2016-01-01 ENCOUNTER — Telehealth: Payer: Self-pay | Admitting: *Deleted

## 2016-01-01 ENCOUNTER — Ambulatory Visit (HOSPITAL_COMMUNITY)
Admission: RE | Admit: 2016-01-01 | Discharge: 2016-01-01 | Disposition: A | Discharge status: Home / Self Care | Payer: BLUE CROSS/BLUE SHIELD | Source: Ambulatory Visit | Attending: Cardiovascular Disease | Admitting: Cardiovascular Disease

## 2016-01-01 DIAGNOSIS — R600 Localized edema: Secondary | ICD-10-CM

## 2016-01-01 DIAGNOSIS — I739 Peripheral vascular disease, unspecified: Secondary | ICD-10-CM

## 2016-01-01 DIAGNOSIS — R0989 Other specified symptoms and signs involving the circulatory and respiratory systems: Secondary | ICD-10-CM

## 2016-01-01 NOTE — Telephone Encounter (Signed)
"  I'd like to schedule surgery for next Wednesday."  I'm returning your call.  "I want to have my surgery moved from August 30 to the 23.  I have my doppler done on Monday at 1:30p.  They told me that you would get the results in a day.  You will have to call them the next day to get the results."  I will reschedule it but I want to let you know if we don't have the results by Wednesday, you will not be able to have the surgery.  You will need to follow-up with them to insure we get the results on time.  "Oh okay, I will.  You're linked to [facility] aren't you?  So you should be able to get results right a way.  Do I need to call surgical center on Monday?"  No, they will call you.

## 2016-01-02 ENCOUNTER — Ambulatory Visit: Payer: BLUE CROSS/BLUE SHIELD | Admitting: Podiatry

## 2016-01-02 MED ORDER — HYDROCODONE-ACETAMINOPHEN 5-325 MG PO TABS: 1.0000 | ORAL_TABLET | Freq: Four times a day (QID) | ORAL | 0 refills | Status: DC | PRN

## 2016-01-02 NOTE — Telephone Encounter (Signed)
OK to fill vicodin 5/325 disp #20 1 tab PO q6 h prn

## 2016-01-05 ENCOUNTER — Ambulatory Visit (HOSPITAL_COMMUNITY)
Admission: RE | Admit: 2016-01-05 | Discharge: 2016-01-05 | Disposition: A | Discharge status: Home / Self Care | Payer: BLUE CROSS/BLUE SHIELD | Source: Ambulatory Visit | Attending: Cardiology | Admitting: Cardiology

## 2016-01-05 DIAGNOSIS — I739 Peripheral vascular disease, unspecified: Secondary | ICD-10-CM | POA: Insufficient documentation

## 2016-01-05 DIAGNOSIS — R0989 Other specified symptoms and signs involving the circulatory and respiratory systems: Secondary | ICD-10-CM | POA: Diagnosis not present

## 2016-01-05 DIAGNOSIS — I8393 Asymptomatic varicose veins of bilateral lower extremities: Secondary | ICD-10-CM | POA: Diagnosis not present

## 2016-01-06 ENCOUNTER — Encounter: Payer: Self-pay | Admitting: Gastroenterology

## 2016-01-07 ENCOUNTER — Telehealth: Payer: Self-pay | Admitting: *Deleted

## 2016-01-07 ENCOUNTER — Encounter: Payer: Self-pay | Admitting: Podiatry

## 2016-01-07 DIAGNOSIS — M86679 Other chronic osteomyelitis, unspecified ankle and foot: Secondary | ICD-10-CM | POA: Diagnosis not present

## 2016-01-07 DIAGNOSIS — M21541 Acquired clubfoot, right foot: Secondary | ICD-10-CM | POA: Diagnosis not present

## 2016-01-07 NOTE — Telephone Encounter (Addendum)
Pt's str, [name] states pt's dtr is not going to be able to help pt after  today's surgery. [name] states pt weighs 200lbs and it took the dtr 45 minutes to get pt into the 2 story house.  [name] request our office get pt into [facility].  I spoke with [place] [phone], she states pt's BCBS of [place] requires the Hx & Physical of the surgery and PT assessement and pre-certification prior to referral for inpt rehab.Ms [name] recommended PT assessment, and Home health aide in the interim.  I informed [name] of the requirements of BCBS of [place] and recommendation of [place] for PT assessment and Home health aid prior to being qualified under [place] of [place]. [name] states pt does not want to leave her home.  I told [name] I would process as if we were working to get pt into [facility], and pt would still get the help she needed. Left message informing [place] [phone] of referral to [facility] and purpose of possible inpt rehab referral in the future, if pt complied. [place] referral, pt demographics and clinicals. 01/08/2016-Pt states she is having some burning pain in the foot started last night, and again this morning. I told pt that was the typically surgery pain, continue to rest, ice elevate and no weight bearing at all, no dangling or below the heart more than 15 mins/hour, make sure toes have similar temperature to the other foot and quick capillary refill and explained how to do.  Pt states she felt the family jumped the gun before even talking to her about how much help she needed, but she has plenty of help and now has a wheelchair and bedside potty. 01/08/2016-Pt's dtr, [name] states PT was at the house and they had pt up for 30 minutes and her foot is hurting.  I told her to have pt elevate her leg and while leaving the foot and leg in the splint remove the ace wraps and continue to elevate for another 15 minutes, then  lower to hip level and rewrap looser, then continue to elevate and ice. [name] states understanding. 01/13/2016-Pt presents for help with a uncomfortable cast.  I split the cast laterally and medially, placed 1/2" thick and 8"L x 5"W white foam padding inside top of cast overlapping the edge, and placed inside cast 1/2" thick and 6"L x 5"W white foam padding in the anterior ankle bend, where the pt indicated areas of pain. I wrapped the cast from toes to distal edge with 2 ace wrap catching the portion of the white foam on the outside of the cast to hold in place.  Pt states she is much more comfortable. 02/04/2016-Pt request pain medication for the left foot which she states is bearing the brunt of the weight due to the right foot surgery, right foot hurts as well. Dr. Jacqualyn Posey refilled the Percocet as ordered by Dr. Paulla Dolly on 01/26/2016. Informed pt of the orders and she will pick up rx in the [place] office. 02/18/2016-Pt request refill of the pain medication, she is having pain in the left foot more than the right. 02/19/2016-DrJacqualyn Posey refilled the Vicodin 10/325mg  #30 one tablet every 8 hours. Informed pt she could pick up in the [place] office. 02/24/2016-Pt states her surgery foot is infected. Pt states the top of the incision line is open, red and hot. I told pt, we should get her in today, and transferred  to schedulers. Pt is scheduled to see Dr. Jacqualyn Posey in [facility] 03/05/2016-Pt states her left foot is killing her and she would like a refill of her vicodin. Informed pt she could pick up the rx in the [place] office. 03/23/2016-Pt request refill of the Vicodin, states she still can't wear the new shoe she was given, and her 75lb dog jumped on her foot and it is bruised. Informed pt Dr. Jacqualyn Posey was going to refill the Vicodin, and she should be seen this week to make sure the hardware in her foot was stable. I asked pt how the wound looked and she said fine but the foot swelled immediately.  Transferred pt to schedulers to be scheduled this week. 04/01/2016-Pt states Dr. Jacqualyn Posey refilled the Percocet to take at bedtime, but she can't take during the day and she would like refill for the Vicodin. 04/02/2016-I informed pt of Dr. Leigh Aurora statement and she states she will see him today at 2:15pm and discuss. 04/06/2016-Pt request to pick up refill of Vicodin tomorrow due to holiday schedule. 04/07/2016-Informed pt she could get the rx in the [place] office.

## 2016-01-08 NOTE — Telephone Encounter (Signed)
Continue ice elevation. Dialudid as needed. Ice can go behind the knee or on top of the foot. If worsening let me know.

## 2016-01-10 HISTORY — PX: FOOT SURGERY: SHX648

## 2016-01-12 ENCOUNTER — Ambulatory Visit (INDEPENDENT_AMBULATORY_CARE_PROVIDER_SITE_OTHER): Payer: BLUE CROSS/BLUE SHIELD

## 2016-01-12 ENCOUNTER — Ambulatory Visit: Payer: BLUE CROSS/BLUE SHIELD

## 2016-01-12 ENCOUNTER — Ambulatory Visit (INDEPENDENT_AMBULATORY_CARE_PROVIDER_SITE_OTHER): Payer: BLUE CROSS/BLUE SHIELD | Admitting: Podiatry

## 2016-01-12 ENCOUNTER — Encounter: Payer: Self-pay | Admitting: Podiatry

## 2016-01-12 DIAGNOSIS — Z09 Encounter for follow-up examination after completed treatment for conditions other than malignant neoplasm: Secondary | ICD-10-CM

## 2016-01-12 DIAGNOSIS — M79671 Pain in right foot: Secondary | ICD-10-CM | POA: Diagnosis not present

## 2016-01-12 DIAGNOSIS — M19079 Primary osteoarthritis, unspecified ankle and foot: Secondary | ICD-10-CM | POA: Diagnosis not present

## 2016-01-12 DIAGNOSIS — M79672 Pain in left foot: Secondary | ICD-10-CM | POA: Diagnosis not present

## 2016-01-12 DIAGNOSIS — Z9889 Other specified postprocedural states: Secondary | ICD-10-CM | POA: Diagnosis not present

## 2016-01-12 MED ORDER — HYDROMORPHONE HCL 2 MG PO TABS: 2.0000 mg | ORAL_TABLET | Freq: Four times a day (QID) | ORAL | 0 refills | Status: DC | PRN

## 2016-01-12 NOTE — Progress Notes (Signed)
Subjective: [name] is a 55 y.o. is seen today in office s/p right 2nd and 3rd tarsometataral and naviculocuneiform arthrodesis preformed on 01/07/16. She states her pain is controlled and she is asking for a refill of dilaudid. She has remained NWB and tried to keep her foot elevated and iced. Denies any systemic complaints such as fevers, chills, nausea, vomiting. No calf pain, chest pain, shortness of breath.   Objective: General: No acute distress, AAOx3  DP/PT pulses palpable 2/4, CRT < 3 sec to all digits.  Protective sensation intact. Motor function intact.  Right foot: Incision is well coapted without any evidence of dehiscence with sutures and staples intact. There is no surrounding erythema, ascending cellulitis, fluctuance, crepitus, malodor, drainage/purulence. There is minimal edema around the surgical site. There is minimal pain along the surgical site.  No other areas of tenderness to bilateral lower extremities.  No other open lesions or pre-ulcerative lesions.  No pain with calf compression, swelling, warmth, erythema.   Assessment and Plan:  Status post right foot surgery, doing well with no complications   -Treatment options discussed including all alternatives, risks, and complications -X-rays were obtained and reviewed with the patient. Hardware intact. No evidence of acute fracture. -Antibiotic ointment was applied to the incisions over by dry sterile dressing. At this time a well-padded below-knee fiberglass cast was applied making sure to pad all bony prominence. Discussed cast care. -Pain medication as needed. Refilled Dilaudid today. -Ice/elevation -Pain medication as needed. -Monitor for any clinical signs or symptoms of infection and DVT/PE and directed to call the office immediately should any occur or go to the ER. -Follow-up in 2 weeks or sooner if any problems arise. In the meantime, encouraged to call the office with any questions, concerns, change in  symptoms.   Celesta Gentile, DPM   *Patient left the office she did call see her cast is causing pain. I recommended her to contact the office to have the cast removed however she cannot come today. She'll likely come back to the office on Tuesday to have the cast bivalved.

## 2016-01-12 NOTE — Patient Instructions (Signed)
°Cast or Splint Care  ° ° °Casts and splints support injured limbs and keep bones from moving while they heal. It is important to care for your cast or splint at home.  °HOME CARE INSTRUCTIONS  °Keep the cast or splint uncovered during the drying period. It can take 24 to 48 hours to dry if it is made of plaster. A fiberglass cast will dry in less than 1 hour.  °Do not rest the cast on anything harder than a pillow for the first 24 hours.  °Do not put weight on your injured limb or apply pressure to the cast until your health care provider gives you permission.  °Keep the cast or splint dry. Wet casts or splints can lose their shape and may not support the limb as well. A wet cast that has lost its shape can also create harmful pressure on your skin when it dries. Also, wet skin can become infected.  °Cover the cast or splint with a plastic bag when bathing or when out in the rain or snow. If the cast is on the trunk of the body, take sponge baths until the cast is removed.  °If your cast does become wet, dry it with a towel or a blow dryer on the cool setting only. °Keep your cast or splint clean. Soiled casts may be wiped with a moistened cloth.  °Do not place any hard or soft foreign objects under your cast or splint, such as cotton, toilet paper, lotion, or powder.  °Do not try to scratch the skin under the cast with any object. The object could get stuck inside the cast. Also, scratching could lead to an infection. If itching is a problem, use a blow dryer on a cool setting to relieve discomfort.  °Do not trim or cut your cast or remove padding from inside of it.  °Exercise all joints next to the injury that are not immobilized by the cast or splint. For example, if you have a long leg cast, exercise the hip joint and toes. If you have an arm cast or splint, exercise the shoulder, elbow, thumb, and fingers.  °Elevate your injured arm or leg on 1 or 2 pillows for the first 1 to 3 days to decrease swelling and  pain. It is best if you can comfortably elevate your cast so it is higher than your heart. °SEEK MEDICAL CARE IF:  °Your cast or splint cracks.  °Your cast or splint is too tight or too loose.  °You have unbearable itching inside the cast.  °Your cast becomes wet or develops a soft spot or area.  °You have a bad smell coming from inside your cast.  °You get an object stuck under your cast.  °Your skin around the cast becomes red or raw.  °You have new pain or worsening pain after the cast has been applied. °SEEK IMMEDIATE MEDICAL CARE IF:  °You have fluid leaking through the cast.  °You are unable to move your fingers or toes.  °You have discolored (blue or white), cool, painful, or very swollen fingers or toes beyond the cast.  °You have tingling or numbness around the injured area.  °You have severe pain or pressure under the cast.  °You have any difficulty with your breathing or have shortness of breath.  °You have chest pain. °This information is not intended to replace advice given to you by your health care provider. Make sure you discuss any questions you have with your health care provider.  °  Document Released: 04/30/2000 Document Revised: 02/21/2013 Document Reviewed: 11/09/2012  °Elsevier Interactive Patient Education ©2016 [employer]  ° °

## 2016-01-14 ENCOUNTER — Ambulatory Visit: Payer: BLUE CROSS/BLUE SHIELD

## 2016-01-14 DIAGNOSIS — Z09 Encounter for follow-up examination after completed treatment for conditions other than malignant neoplasm: Secondary | ICD-10-CM

## 2016-01-14 DIAGNOSIS — Z9889 Other specified postprocedural states: Secondary | ICD-10-CM

## 2016-01-15 ENCOUNTER — Ambulatory Visit: Payer: BLUE CROSS/BLUE SHIELD

## 2016-01-15 DIAGNOSIS — Z9889 Other specified postprocedural states: Secondary | ICD-10-CM

## 2016-01-15 DIAGNOSIS — Z09 Encounter for follow-up examination after completed treatment for conditions other than malignant neoplasm: Secondary | ICD-10-CM

## 2016-01-15 NOTE — Progress Notes (Signed)
Pt presents stating that her cast is too tight. Cast was bi-valved, patient stated immediate relief. Cast was wrapped in ace bandage. Toes were warm to the touch and pink, capillary refill less than 3 seconds on toes. She was advised to follow up with any concerns or discomfort.

## 2016-01-15 NOTE — Progress Notes (Signed)
Pt presents stating that she got her cast wet when trying to take a shower. Cast was removed, Surgical incision intact, all staples remained. No drainage, redness or warmth at incision site. Redressed with sterile gauze and kerlix. Padded surgical sites, cast applied with assistance from Dr Amalia Hailey. Patient stated cast was snug, toes were pink and warm, denied calf pain and tenderness. Advised to follow up before her schedule appt if she needed any adjustments to the cast

## 2016-01-20 ENCOUNTER — Encounter: Payer: BLUE CROSS/BLUE SHIELD | Admitting: Gastroenterology

## 2016-01-21 ENCOUNTER — Telehealth: Payer: Self-pay

## 2016-01-21 ENCOUNTER — Other Ambulatory Visit: Payer: Self-pay

## 2016-01-21 MED ORDER — HYDROCODONE-ACETAMINOPHEN 10-325 MG PO TABS: 1.0000 | ORAL_TABLET | Freq: Three times a day (TID) | ORAL | 0 refills | Status: DC | PRN

## 2016-01-21 NOTE — Telephone Encounter (Signed)
Pt called stating that the pain medication prescribed to her this last time was causing her to be sick on her stomach and causing stomach pains, she has tried taking the medication with food and with the prescribed phenergan with no relief. She is requesting a different pain medication at this time

## 2016-01-22 NOTE — Telephone Encounter (Signed)
OK to do percocet 02/3024 disp 40 1 tap po q4h prn pain

## 2016-01-23 NOTE — Progress Notes (Signed)
DOS 08.23.2017 Right foot fusion of midfoot joints (including lisfranc joint and naviculocuneiform joints) cast application

## 2016-01-26 ENCOUNTER — Ambulatory Visit (INDEPENDENT_AMBULATORY_CARE_PROVIDER_SITE_OTHER): Payer: BLUE CROSS/BLUE SHIELD | Admitting: Podiatry

## 2016-01-26 DIAGNOSIS — Z09 Encounter for follow-up examination after completed treatment for conditions other than malignant neoplasm: Secondary | ICD-10-CM

## 2016-01-26 DIAGNOSIS — M19079 Primary osteoarthritis, unspecified ankle and foot: Secondary | ICD-10-CM | POA: Diagnosis not present

## 2016-01-26 MED ORDER — OXYCODONE-ACETAMINOPHEN 10-325 MG PO TABS: 1.0000 | ORAL_TABLET | Freq: Four times a day (QID) | ORAL | 0 refills | Status: DC | PRN

## 2016-01-29 NOTE — Progress Notes (Signed)
Subjective: [name] is a 55 y.o. is seen today in office s/p right 2nd and 3rd tarsometataral and naviculocuneiform arthrodesis preformed on 01/07/16. She has multiple issues with her casts. She got vicodin and asking for percocet due to pain although her pain is improving. She was on vicodin prior to surgery and she feels that she needs something stronger but does not want dilaudid. She has tried to be NWB as much as possible but she does put weight on her foot.  Denies any systemic complaints such as fevers, chills, nausea, vomiting. No calf pain, chest pain, shortness of breath.   Objective: General: No acute distress, AAOx3  DP/PT pulses palpable 2/4, CRT < 3 sec to all digits.  Protective sensation intact. Motor function intact.  Right foot: Incision is well coapted without any evidence of dehiscence with sutures and staples intact. There is no surrounding erythema, ascending cellulitis, fluctuance, crepitus, malodor, drainage/purulence. There is minimal edema around the surgical site. There is mild pain along the surgical site.  No other areas of tenderness to bilateral lower extremities.  No other open lesions or pre-ulcerative lesions.  No pain with calf compression, swelling, warmth, erythema.   Assessment and Plan:  Status post right foot surgery, doing well with no complications   -Treatment options discussed including all alternatives, risks, and complications -Sutures removed but staples remained intact. Antibiotic ointment was applied to the incisions over by dry sterile dressing.  -Due to issues with the cast and swelling will place in a CAM boot today but she should wear this at all times, even at night.  -Pain medication as needed. Percocet prescribed today. -Ice/elevation -Monitor for any clinical signs or symptoms of infection and DVT/PE and directed to call the office immediately should any occur or go to the ER. -Follow-up as scheduled or sooner if any problems arise. In  the meantime, encouraged to call the office with any questions, concerns, change in symptoms.   Celesta Gentile, DPM

## 2016-02-04 MED ORDER — OXYCODONE-ACETAMINOPHEN 10-325 MG PO TABS: 1.0000 | ORAL_TABLET | Freq: Four times a day (QID) | ORAL | 0 refills | Status: DC | PRN

## 2016-02-04 NOTE — Telephone Encounter (Signed)
Can refill percocet.

## 2016-02-06 ENCOUNTER — Ambulatory Visit: Payer: BLUE CROSS/BLUE SHIELD | Admitting: Podiatry

## 2016-02-10 ENCOUNTER — Ambulatory Visit (INDEPENDENT_AMBULATORY_CARE_PROVIDER_SITE_OTHER): Payer: BLUE CROSS/BLUE SHIELD

## 2016-02-10 ENCOUNTER — Encounter: Payer: Self-pay | Admitting: Podiatry

## 2016-02-10 ENCOUNTER — Ambulatory Visit (INDEPENDENT_AMBULATORY_CARE_PROVIDER_SITE_OTHER): Payer: BLUE CROSS/BLUE SHIELD | Admitting: Podiatry

## 2016-02-10 DIAGNOSIS — Z09 Encounter for follow-up examination after completed treatment for conditions other than malignant neoplasm: Secondary | ICD-10-CM

## 2016-02-10 DIAGNOSIS — Z9889 Other specified postprocedural states: Secondary | ICD-10-CM

## 2016-02-10 DIAGNOSIS — M19079 Primary osteoarthritis, unspecified ankle and foot: Secondary | ICD-10-CM

## 2016-02-10 MED ORDER — HYDROCODONE-ACETAMINOPHEN 10-325 MG PO TABS: 1.0000 | ORAL_TABLET | Freq: Three times a day (TID) | ORAL | 0 refills | Status: DC | PRN

## 2016-02-10 NOTE — Progress Notes (Signed)
Subjective: [name] is a 55 y.o. is seen today in office s/p right 2nd and 3rd tarsometataral and naviculocuneiform arthrodesis preformed on 01/07/16. She states that she is doing well however she is ready to put weight on her foot and start to walk in the boot. She states that she is requesting a refill the Vicodin she does not want is allotted or the Percocet. She is remaining nonweightbearing as much as possible the knee scooter. Denies any systemic complaints such as fevers, chills, nausea, vomiting. No calf pain, chest pain, shortness of breath.   Objective: General: No acute distress, AAOx3  DP/PT pulses palpable 2/4, CRT < 3 sec to all digits.  Protective sensation intact. Motor function intact.  Right foot: Incision is well coapted without any evidence of dehiscence with  staples intact. There is no surrounding erythema, ascending cellulitis, fluctuance, crepitus, malodor, drainage/purulence. There is minimal edema around the surgical site. There is no significant pain along the surgical site.  No other areas of tenderness to bilateral lower extremities.  No other open lesions or pre-ulcerative lesions.  No pain with calf compression, swelling, warmth, erythema.   Assessment and Plan:  Status post right foot surgery, doing well with no complications   -Treatment options discussed including all alternatives, risks, and complications -X-rays obtained and reviewed. Hardware intact. Mucosa monofilament on the second third metatarsal cuneiform joints. No evidence of acute fracture. -At this time recommend continue with nonweightbearing the cam boot. The stables removed today. Antibiotic ointment was applied followed by a bandage. She can start to shower tomorrow illnesses any problems with the incision. -Refill Vicodin -Ice and elevation -Monitor for any clinical signs or symptoms of infection and DVT/PE and directed to call the office immediately should any occur or go to the  ER. -Follow-up in 2 weeks or sooner if any problems arise. In the meantime, encouraged to call the office with any questions, concerns, change in symptoms.   Celesta Gentile, DPM

## 2016-02-19 MED ORDER — HYDROCODONE-ACETAMINOPHEN 10-325 MG PO TABS: 1.0000 | ORAL_TABLET | Freq: Three times a day (TID) | ORAL | 0 refills | Status: DC | PRN

## 2016-02-19 NOTE — Telephone Encounter (Signed)
OK to refill

## 2016-02-24 ENCOUNTER — Ambulatory Visit (INDEPENDENT_AMBULATORY_CARE_PROVIDER_SITE_OTHER): Payer: BLUE CROSS/BLUE SHIELD | Admitting: Podiatry

## 2016-02-24 ENCOUNTER — Encounter: Payer: Self-pay | Admitting: Podiatry

## 2016-02-24 DIAGNOSIS — T8130XA Disruption of wound, unspecified, initial encounter: Secondary | ICD-10-CM

## 2016-02-24 DIAGNOSIS — Z9889 Other specified postprocedural states: Secondary | ICD-10-CM

## 2016-02-24 MED ORDER — CEPHALEXIN 500 MG PO CAPS: 500.0000 mg | ORAL_CAPSULE | Freq: Three times a day (TID) | ORAL | 2 refills | Status: DC

## 2016-02-24 MED ORDER — MUPIROCIN 2 % EX OINT: 1.0000 "application " | TOPICAL_OINTMENT | Freq: Two times a day (BID) | CUTANEOUS | 0 refills | Status: DC

## 2016-02-24 NOTE — Telephone Encounter (Signed)
She must come in today to see someone

## 2016-02-25 NOTE — Progress Notes (Signed)
Subjective: [name] is a 55 y.o. is seen today in office s/p right 2nd and 3rd tarsometataral and naviculocuneiform arthrodesis preformed on 01/07/16. She presents today for an unscheduled appointment due to concerns of the incision on the inside aspect of her foot opening and she has noticed some redness around the area. She states this has been ongoing for the last couple of days but she did not called and she was unsure if this was normal or not. She is not taking any antibiotics. She is not noticed any drainage or pus coming from the incision. She states the other incision is doing well. She did present to the office today by herself and she did drive to the office. Denies any systemic complaints such as fevers, chills, nausea, vomiting. No calf pain, chest pain, shortness of breath.   Objective: General: No acute distress, AAOx3  DP/PT pulses palpable 2/4, CRT < 3 sec to all digits.  Protective sensation intact. Motor function intact.  Right foot: Incision is well coapted without any evidence of dehiscence on the dorsal incision. On the medial incision there is a superficial dehiscence with a small amount of fibrotic tissue on the wound bed. This measures approximately 1.5 x 0.4 cm. There is no probing, undermining or tunneling. There is no exposed hardware and I'm unable to palpate any hardware. There is faint amount of surrounding erythema with no ascending cellulitis. There is no fluctuance or crepitus and there is no malodor. Mild to palpation on both surgical sites. Mild edema to the foot. No other open lesions or pre-ulcerative lesions.  No pain with calf compression, swelling, warmth, erythema.   Assessment and Plan:  Status post right foot surgery, superficial dehiscence of the medial incision with mild localized cellulitis.   -Treatment options discussed including all alternatives, risks, and complications -Was lightly debrided today. Steri-Strips are placed. Bactroban ointment was  ordered and anatomic ointment was applied today followed by dressing. Also prescribed Keflex today. Continue nonweightbearing in a cam boot. She should not be driving. Continue elevation as well. I recommend holding off on going back to work until the wound heals. Watch for any signs or symptoms of worsening infection go immediately to the ER call the office should any occur. -Follow-up as scheduled or sooner if any problems arise. In the meantime, encouraged to call the office with any questions, concerns, change in symptoms.   Celesta Gentile, DPM

## 2016-02-27 ENCOUNTER — Encounter: Payer: Self-pay | Admitting: Podiatry

## 2016-02-27 ENCOUNTER — Ambulatory Visit (INDEPENDENT_AMBULATORY_CARE_PROVIDER_SITE_OTHER): Payer: BLUE CROSS/BLUE SHIELD

## 2016-02-27 ENCOUNTER — Ambulatory Visit (INDEPENDENT_AMBULATORY_CARE_PROVIDER_SITE_OTHER): Payer: BLUE CROSS/BLUE SHIELD | Admitting: Podiatry

## 2016-02-27 DIAGNOSIS — Z09 Encounter for follow-up examination after completed treatment for conditions other than malignant neoplasm: Secondary | ICD-10-CM

## 2016-02-27 DIAGNOSIS — T8130XA Disruption of wound, unspecified, initial encounter: Secondary | ICD-10-CM

## 2016-02-27 MED ORDER — HYDROCODONE-ACETAMINOPHEN 10-325 MG PO TABS: 1.0000 | ORAL_TABLET | Freq: Four times a day (QID) | ORAL | 0 refills | Status: DC | PRN

## 2016-03-01 ENCOUNTER — Ambulatory Visit (INDEPENDENT_AMBULATORY_CARE_PROVIDER_SITE_OTHER): Payer: BLUE CROSS/BLUE SHIELD | Admitting: Podiatry

## 2016-03-01 ENCOUNTER — Encounter: Payer: Self-pay | Admitting: Podiatry

## 2016-03-01 ENCOUNTER — Ambulatory Visit: Payer: BLUE CROSS/BLUE SHIELD | Admitting: Podiatry

## 2016-03-01 DIAGNOSIS — Z09 Encounter for follow-up examination after completed treatment for conditions other than malignant neoplasm: Secondary | ICD-10-CM

## 2016-03-01 DIAGNOSIS — T8130XA Disruption of wound, unspecified, initial encounter: Secondary | ICD-10-CM

## 2016-03-01 NOTE — Progress Notes (Signed)
Subjective: [name] is a 55 y.o. is seen today in office s/p right 2nd and 3rd tarsometataral and naviculocuneiform arthrodesis preformed on 01/07/16. She presents today for follow-up evaluation of wound dehiscence in medial incision. She denies any drainage or coming from the area. She says is faint amount of redness around the rim around the wound. Denies any red streaks. Swelling is intermittent. She is remaining in a cam boot and trying to be nonweightbearing as much as possible. She has continued to work. Denies any systemic complaints such as fevers, chills, nausea, vomiting. No calf pain, chest pain, shortness of breath.   Objective: General: No acute distress, AAOx3  DP/PT pulses palpable 2/4, CRT < 3 sec to all digits.  Protective sensation intact. Motor function intact.  Right foot: Incision is well coapted without any evidence of dehiscence on the dorsal incision. On the medial incision there is a superficial dehiscence with a small amount of fibrotic tissue on the wound bed. This measures approximately 1 x 0.4 cm. the wound appears to be increase in size compared to last appointment. There is no probing, undermining or tunneling. There is no exposed hardware and I'm unable to palpate any hardware. There is faint amount of surrounding erythema with no ascending cellulitis. There is no fluctuance or crepitus and there is no malodor. Mild to palpation on both surgical sites. Mild edema to the foot. No other open lesions or pre-ulcerative lesions.  No pain with calf compression, swelling, warmth, erythema.   Assessment and Plan:  Status post right foot surgery, superficial dehiscence of the medial incision with mild localized cellulitis.   -Treatment options discussed including all alternatives, risks, and complications -Wound was slightly debrided again today. Continue antibiotics. I discussed with her surgical debridement on the wound. I will see her back on Monday for  evaluation. -Continue antibiotics. Monitor for signs or symptoms of forcing infection go to the ER call the office in medial should any occur. Continue nonweightbearing cam boot.   Celesta Gentile, DPM

## 2016-03-02 NOTE — Progress Notes (Signed)
Subjective: [name] is a 55 y.o. is seen today in office s/p right 2nd and 3rd tarsometataral and naviculocuneiform arthrodesis preformed on 01/07/16. She presents to the office for follow-up evaluation of wound dehiscence on the medial incision on the right foot. She states that she is still having some pain but the pain medicine does help. She has not noticed any pus coming from the wound. There is still a small amount of redness around the area but denies any red streaking. No foul smell. She has continued to work. Denies any systemic complaints such as fevers, chills, nausea, vomiting. No calf pain, chest pain, shortness of breath.   Objective: General: No acute distress, AAOx3  DP/PT pulses palpable 2/4, CRT < 3 sec to all digits.  Protective sensation intact. Motor function intact.  Right foot: Incision is well coapted without any evidence of dehiscence on the dorsal incision. Scar is well formed. On the medial incision there is dehiscence of the wound. The wound appears to have more tissue coverage however there is some fibrotic tissue along the wound. I am unable to express any drainage or pus. There is a faint rim of surrounding erythema but there is no ascending cellulitis. Unable to identify any fluctuance or crepitus or any malodor today. See procedure note below. No other open lesions or pre-ulcerative lesions.  No pain with calf compression, swelling, warmth, erythema.   Assessment and Plan:  Status post right foot surgery, medial incision wound dehiscence  -Treatment options discussed including all alternatives, risks, and complications -I discussed with her surgical debridement of the wound. They wish to proceed with this in the office. I discussed the risks and complications and she verbally consented this. Under sterile conditions a mixture of lidocaine Marcaine mixture was infiltrated in a regional block fashion around the wound. The area was cleaned. Utilizing a 15 blade scalpel  the wound was debrided to healthy, granular wound base. The wound did go down to subcutaneous tissue. There is no exposed hardware today. Unable to identify any undermining or tunneling. There is no pus expressed. Upon completion of the wound didn't appear to be granular and had healthy bleeding to the wound. To help hold the wound more approximated I did place 3 simple interrupted nylon sutures to help reapproximate the wound however the wound was left open and where to help heal this was just done to help bring the skin edges closer together to help facilitate healing. Antibiotic ointment and a dressing was applied. A dry dressing, compression dressing was applied. Post procedure instructions were discussed. -Continue antibiotics -Pain medication as needed -Elevation -Nonweightbearing at all times. Wear cam boot at all times -Monitor for signs or symptoms of worsening infection call the office and medially should any occur. She verbally understood this -Follow-up in 1 week or sooner if needed. Call any questions or concerns in the meantime.  Celesta Gentile, DPM

## 2016-03-05 ENCOUNTER — Other Ambulatory Visit: Payer: Self-pay | Admitting: Podiatry

## 2016-03-05 MED ORDER — CEPHALEXIN 500 MG PO CAPS: 500.0000 mg | ORAL_CAPSULE | Freq: Three times a day (TID) | ORAL | 2 refills | Status: DC

## 2016-03-05 MED ORDER — HYDROCODONE-ACETAMINOPHEN 10-325 MG PO TABS: 1.0000 | ORAL_TABLET | Freq: Three times a day (TID) | ORAL | 0 refills | Status: DC | PRN

## 2016-03-05 NOTE — Progress Notes (Signed)
Patient came in to pick up pain medication prescription. Refilled keflex to continue. She told me she believes it is healing but there is a small rim of redness continuing but no pus and no red streaking.   Upon leaving she was seen driving away. I have recommended on each office visit not to drive.

## 2016-03-05 NOTE — Telephone Encounter (Signed)
OK to refill. Can you see how she is doing? How is the wound healing?

## 2016-03-08 ENCOUNTER — Ambulatory Visit (INDEPENDENT_AMBULATORY_CARE_PROVIDER_SITE_OTHER): Payer: BLUE CROSS/BLUE SHIELD | Admitting: Podiatry

## 2016-03-08 ENCOUNTER — Encounter: Payer: Self-pay | Admitting: Podiatry

## 2016-03-08 DIAGNOSIS — Z9889 Other specified postprocedural states: Secondary | ICD-10-CM

## 2016-03-08 DIAGNOSIS — T8130XA Disruption of wound, unspecified, initial encounter: Secondary | ICD-10-CM

## 2016-03-11 ENCOUNTER — Ambulatory Visit (AMBULATORY_SURGERY_CENTER): Payer: BLUE CROSS/BLUE SHIELD | Admitting: Gastroenterology

## 2016-03-11 ENCOUNTER — Encounter: Payer: Self-pay | Admitting: Gastroenterology

## 2016-03-11 VITALS — BP 108/75 | HR 73 | Temp 96.8°F | Resp 15 | Ht 64.5 in | Wt 266.0 lb

## 2016-03-11 DIAGNOSIS — D123 Benign neoplasm of transverse colon: Secondary | ICD-10-CM | POA: Diagnosis not present

## 2016-03-11 DIAGNOSIS — D126 Benign neoplasm of colon, unspecified: Secondary | ICD-10-CM | POA: Diagnosis not present

## 2016-03-11 DIAGNOSIS — Z1211 Encounter for screening for malignant neoplasm of colon: Secondary | ICD-10-CM

## 2016-03-11 DIAGNOSIS — K635 Polyp of colon: Secondary | ICD-10-CM

## 2016-03-11 DIAGNOSIS — K2970 Gastritis, unspecified, without bleeding: Secondary | ICD-10-CM

## 2016-03-11 DIAGNOSIS — D124 Benign neoplasm of descending colon: Secondary | ICD-10-CM

## 2016-03-11 DIAGNOSIS — R1013 Epigastric pain: Secondary | ICD-10-CM

## 2016-03-11 DIAGNOSIS — Z1212 Encounter for screening for malignant neoplasm of rectum: Secondary | ICD-10-CM | POA: Diagnosis not present

## 2016-03-11 DIAGNOSIS — K2990 Gastroduodenitis, unspecified, without bleeding: Secondary | ICD-10-CM

## 2016-03-11 DIAGNOSIS — K228 Other specified diseases of esophagus: Secondary | ICD-10-CM | POA: Diagnosis not present

## 2016-03-11 MED ORDER — SODIUM CHLORIDE 0.9 % IV SOLN
500.0000 mL | INTRAVENOUS | Status: DC
Start: 2016-03-11 — End: 2017-11-07

## 2016-03-11 NOTE — Progress Notes (Signed)
Called to room to assist during endoscopic procedure.  Patient ID and intended procedure confirmed with present staff. Received instructions for my participation in the procedure from the performing physician.  

## 2016-03-11 NOTE — Op Note (Signed)
[place] Patient Name: [name] Procedure Date: 03/11/2016 1:32 PM MRN: [id number] Endoscopist: Remo Lipps P. Jamilla Galli MD, MD Age: 55 Referring MD:  Date of Birth: August 27, 1960 Gender: Female Account #: [id number] Procedure:                Upper GI endoscopy Indications:              Epigastric abdominal pain, history of gastric bypass Medicines:                Monitored Anesthesia Care Procedure:                Pre-Anesthesia Assessment:                           - Prior to the procedure, a History and Physical                            was performed, and patient medications and                            allergies were reviewed. The patient's tolerance of                            previous anesthesia was also reviewed. The risks                            and benefits of the procedure and the sedation                            options and risks were discussed with the patient.                            All questions were answered, and informed consent                            was obtained. Prior Anticoagulants: The patient has                            taken no previous anticoagulant or antiplatelet                            agents. ASA Grade Assessment: III - A patient with                            severe systemic disease. After reviewing the risks                            and benefits, the patient was deemed in                            satisfactory condition to undergo the procedure.                           After obtaining informed consent, the endoscope was  passed under direct vision. Throughout the                            procedure, the patient's blood pressure, pulse, and                            oxygen saturations were monitored continuously. The                            Model GIF-[id number]) scope was introduced                            through the mouth, and advanced to the jejunum. The           upper GI endoscopy was accomplished without                            difficulty. The patient tolerated the procedure                            well. Scope In: Scope Out: Findings:                 Esophagogastric landmarks were identified: the                            Z-line was found at 38 cm, the gastroesophageal                            junction was found at 38 cm and the upper extent of                            the gastric folds was found at 38 cm from the                            incisors.                           Multiple small white plaques were found in the                            entire esophagus concerning for esophageal                            candidiasis. Biopsies were taken with a cold                            forceps for histology / microbiology.                           The exam of the esophagus was otherwise normal.                           Evidence of a Roux-en-Y gastrojejunostomy was  found. The gastrojejunal anastomosis was                            characterized by healthy appearing mucosa.                           Diffuse mild inflammation characterized by adherent                            blood and erythema was found in the gastric pouch,                            without ulceration. Biopsies were taken with a cold                            forceps for Helicobacter pylori testing. The pouch                            appeared larger than expected                           The exam of the stomach was otherwise normal.                           The examined small bowel limb was normal. Complications:            No immediate complications. Estimated blood loss:                            Minimal. Estimated Blood Loss:     Estimated blood loss was minimal. Impression:               - Esophagogastric landmarks identified.                           - Multiple plaques in the esophagus, concerning for                             candidiasis. Biopsied.                           - Roux-en-Y gastrojejunostomy with gastrojejunal                            anastomosis characterized by healthy appearing                            mucosa, no ulcerations note.                           - Gastritis of the gastric pouch. Biopsied.                           - Larger than expected gastric pouch                           - Normal  examined small bowel limb Recommendation:           - Patient has a contact number available for                            emergencies. The signs and symptoms of potential                            delayed complications were discussed with the                            patient. Return to normal activities tomorrow.                            Written discharge instructions were provided to the                            patient.                           - Resume previous diet.                           - Continue present medications                           - Continue PPI capsules - ensure you break these                            open prior to ingestion                           - NO NSAIDs                           - Await pathology results. Will treat for                            candidiasis if positive. Remo Lipps P. Marcile Fuquay MD, MD 03/11/2016 2:25:54 PM This report has been signed electronically.

## 2016-03-11 NOTE — Op Note (Signed)
[facility] Patient Name: [name] Procedure Date: 03/11/2016 1:31 PM MRN: [id number] Endoscopist: Remo Lipps P. Amogh Komatsu MD, MD Age: 55 Referring MD:  Date of Birth: [birth year] Gender: Female Account #: [id number] Procedure:                Colonoscopy Indications:              Screening for malignant neoplasm in the colon, This                            is the patient's first colonoscopy Medicines:                Monitored Anesthesia Care Procedure:                Pre-Anesthesia Assessment:                           - Prior to the procedure, a History and Physical                            was performed, and patient medications and                            allergies were reviewed. The patient's tolerance of                            previous anesthesia was also reviewed. The risks                            and benefits of the procedure and the sedation                            options and risks were discussed with the patient.                            All questions were answered, and informed consent                            was obtained. Prior Anticoagulants: The patient has                            taken no previous anticoagulant or antiplatelet                            agents. ASA Grade Assessment: III - A patient with                            severe systemic disease. After reviewing the risks                            and benefits, the patient was deemed in                            satisfactory condition to undergo the procedure.  After obtaining informed consent, the colonoscope                            was passed under direct vision. Throughout the                            procedure, the patient's blood pressure, pulse, and                            oxygen saturations were monitored continuously. The                            Model CF-HQ190L [id number]) scope was introduced                            through the  anus and advanced to the the cecum,                            identified by appendiceal orifice and ileocecal                            valve. The colonoscopy was performed without                            difficulty. The patient tolerated the procedure                            well. The quality of the bowel preparation was                            fair. The ileocecal valve, appendiceal orifice, and                            rectum were photographed. Scope In: 1:46:57 PM Scope Out: 2:11:52 PM Scope Withdrawal Time: 0 hours 18 minutes 19 seconds  Total Procedure Duration: 0 hours 24 minutes 55 seconds  Findings:                 The perianal and digital rectal examinations were                            normal.                           A 10 mm polyp was found in the transverse colon.                            The polyp was sessile. The polyp was removed with a                            cold snare. Resection and retrieval were complete.                           A 6 mm polyp was found in the descending colon. The  polyp was sessile. The polyp was removed with a                            cold snare. Resection and retrieval were complete.                           A 4 mm polyp was found in the sigmoid colon. The                            polyp was sessile. The polyp was removed with a                            cold snare. Resection and retrieval were complete.                           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                           Non-bleeding internal hemorrhoids were found during                            retroflexion.                           A few medium-mouthed diverticula were found in the                            left colon.                           A large amount of liquid stool was found in the                            entire colon, making visualization difficult.                            Lavage of the  area was performed using copious                            amounts of sterile water, resulting in clearance                            with fair visualization.                           The exam was otherwise without abnormality. Complications:            No immediate complications. Estimated blood loss:                            Minimal. Estimated Blood Loss:     Estimated blood loss was minimal. Impression:               - Preparation of the colon was fair.                           -  One 10 mm polyp in the transverse colon, removed                            with a cold snare. Resected and retrieved.                           - One 6 mm polyp in the descending colon, removed                            with a cold snare. Resected and retrieved.                           - One 4 mm polyp in the sigmoid colon, removed with                            a cold snare. Resected and retrieved.                           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                           - Non-bleeding internal hemorrhoids.                           - Diverticulosis in the left colon.                           - Stool in the entire examined colon.                           - The examination was otherwise normal. Recommendation:           - Patient has a contact number available for                            emergencies. The signs and symptoms of potential                            delayed complications were discussed with the                            patient. Return to normal activities tomorrow.                            Written discharge instructions were provided to the                            patient.                           - Resume previous diet.                           - Continue present medications.                           -  No ibuprofen, naproxen, or other non-steroidal                            anti-inflammatory drugs for 2 weeks after polyp                             removal.                           - Await pathology results.                           - Repeat colonoscopy is recommended for                            surveillance. The colonoscopy date will be                            determined after pathology results from today's                            exam become available for review. Remo Lipps P. Isreal Moline MD, MD 03/11/2016 2:20:24 PM This report has been signed electronically.

## 2016-03-11 NOTE — Progress Notes (Signed)
To PACU, vss patent aw report to rn 

## 2016-03-11 NOTE — Patient Instructions (Signed)
YOU HAD AN ENDOSCOPIC PROCEDURE TODAY AT [facility]:   Refer to the procedure report that was given to you for any specific questions about what was found during the examination.  If the procedure report does not answer your questions, please call your gastroenterologist to clarify.  If you requested that your care partner not be given the details of your procedure findings, then the procedure report has been included in a sealed envelope for you to review at your convenience later.  YOU SHOULD EXPECT: Some feelings of bloating in the abdomen. Passage of more gas than usual.  Walking can help get rid of the air that was put into your GI tract during the procedure and reduce the bloating. If you had a lower endoscopy (such as a colonoscopy or flexible sigmoidoscopy) you may notice spotting of blood in your stool or on the toilet paper. If you underwent a bowel prep for your procedure, you may not have a normal bowel movement for a few days.  Please Note:  You might notice some irritation and congestion in your nose or some drainage.  This is from the oxygen used during your procedure.  There is no need for concern and it should clear up in a day or so.  SYMPTOMS TO REPORT IMMEDIATELY:   Following lower endoscopy (colonoscopy or flexible sigmoidoscopy):  Excessive amounts of blood in the stool  Significant tenderness or worsening of abdominal pains  Swelling of the abdomen that is new, acute  Fever of 100F or higher   Following upper endoscopy (EGD)  Vomiting of blood or coffee ground material  New chest pain or pain under the shoulder blades  Painful or persistently difficult swallowing  New shortness of breath  Fever of 100F or higher  Black, tarry-looking stools  For urgent or emergent issues, a gastroenterologist can be reached at any hour by calling [phone].  Please read all handouts given to you by your recovery RN.  No ibuprofen, Advil, NSAIDS for 2  weeks.   DIET:  We do recommend a small meal at first, but then you may proceed to your regular diet.  Drink plenty of fluids but you should avoid alcoholic beverages for 24 hours.  ACTIVITY:  You should plan to take it easy for the rest of today and you should NOT DRIVE or use heavy machinery until tomorrow (because of the sedation medicines used during the test).    FOLLOW UP: Our staff will call the number listed on your records the next business day following your procedure to check on you and address any questions or concerns that you may have regarding the information given to you following your procedure. If we do not reach you, we will leave a message.  However, if you are feeling well and you are not experiencing any problems, there is no need to return our call.  We will assume that you have returned to your regular daily activities without incident.  If any biopsies were taken you will be contacted by phone or by letter within the next 1-3 weeks.  Please call us at [phone] if you have not heard about the biopsies in 3 weeks.    SIGNATURES/CONFIDENTIALITY: You and/or your care partner have signed paperwork which will be entered into your electronic medical record.  These signatures attest to the fact that that the information above on your After Visit Summary has been reviewed and is understood.  Full responsibility of the confidentiality of this  discharge information lies with you and/or your care-partner.  Thank you for letting us take care of your healthcare needs today.

## 2016-03-12 ENCOUNTER — Telehealth: Payer: Self-pay | Admitting: Rheumatology

## 2016-03-12 ENCOUNTER — Telehealth: Payer: Self-pay | Admitting: *Deleted

## 2016-03-12 ENCOUNTER — Telehealth: Payer: Self-pay

## 2016-03-12 NOTE — Telephone Encounter (Signed)
  Follow up Call-  Call back number 03/11/2016  Post procedure Call Back phone  # [phone]  Permission to leave phone message Yes  Some recent data might be hidden     Patient questions:  Do you have a fever, pain , or abdominal swelling? No. Pain Score  0 *  Have you tolerated food without any problems? Yes.    Have you been able to return to your normal activities? Yes.    Do you have any questions about your discharge instructions: Diet   No. Medications  No. Follow up visit  No.  Do you have questions or concerns about your Care? No.  Actions: * If pain score is 4 or above: No action needed, pain <4.

## 2016-03-12 NOTE — Telephone Encounter (Signed)
Patient is requesting refill of Methotrexate. Patient states she had lab work faxed over last week.

## 2016-03-12 NOTE — Telephone Encounter (Signed)
Called patient to advise I have not gotten labs, she will have office resend.  12/16/15 last visit Next visit in Jan

## 2016-03-12 NOTE — Telephone Encounter (Signed)
Attempted to reach pt. For follow up call following procedure yesterday.   LM on pt.'s ans. Machine to call with any questions or concerns, and that we would try to reach her again later today.

## 2016-03-14 NOTE — Progress Notes (Signed)
Subjective: [name] is a 55 y.o. is seen today in office s/p right 2nd and 3rd tarsometataral and naviculocuneiform arthrodesis preformed on 01/07/16. She presents today for follow-up evaluation of a wound dehiscence in the medial aspect of the right foot. She states that the wound is healing very nicely and is doing significantly better compared to last appointment. She has not had any drainage or pus coming from the wound. The redness has improved as well as the swelling.  Denies any systemic complaints such as fevers, chills, nausea, vomiting. No calf pain, chest pain, shortness of breath.   Objective: General: No acute distress, AAOx3  DP/PT pulses palpable 2/4, CRT < 3 sec to all digits.  Protective sensation intact. Motor function intact.  Right foot: Incision is well coapted without any evidence of dehiscence on the dorsal incision. Scar is well formed. On the medial incision there is dehiscence of the wound. Sutures are intact. The wound appears to be significantly improved compared to last appointment and is closed except for the very distal aspect measures approximately 0.5 x 0.2 cm. There is no drainage or pus expressed. There is no significant surrounding erythema and there is no ascending cellulitis. There is mild edema. The swelling appears to be improved. There is no fluctuance or crepitus or any malodor. Mild pain along the surgical sites. No other open lesions or pre-ulcerative lesions.  No pain with calf compression, swelling, warmth, erythema.   Assessment and Plan:  Status post right foot surgery, medial incision wound dehiscence which has improved.   -Treatment options discussed including all alternatives, risks, and complications -Continue in about ointment dressing changes of the wound daily. The wound appears to be healing quite well. Continue antibiotic for now. Monitoring signs or symptoms of infection and call the office immediately should any occur or if there is any  changes. -Continue a nonweightbearing for now. Continue in cam boot. She can perform daily dressing changes.  *x-ray next appointment   Celesta Gentile, DPM

## 2016-03-15 ENCOUNTER — Ambulatory Visit (INDEPENDENT_AMBULATORY_CARE_PROVIDER_SITE_OTHER): Payer: BLUE CROSS/BLUE SHIELD

## 2016-03-15 ENCOUNTER — Ambulatory Visit (INDEPENDENT_AMBULATORY_CARE_PROVIDER_SITE_OTHER): Payer: BLUE CROSS/BLUE SHIELD | Admitting: Podiatry

## 2016-03-15 ENCOUNTER — Encounter: Payer: Self-pay | Admitting: Podiatry

## 2016-03-15 VITALS — BP 109/77 | HR 98 | Resp 16

## 2016-03-15 DIAGNOSIS — M21541 Acquired clubfoot, right foot: Secondary | ICD-10-CM | POA: Diagnosis not present

## 2016-03-15 DIAGNOSIS — M79674 Pain in right toe(s): Secondary | ICD-10-CM | POA: Diagnosis not present

## 2016-03-15 DIAGNOSIS — G8929 Other chronic pain: Secondary | ICD-10-CM | POA: Diagnosis not present

## 2016-03-15 DIAGNOSIS — Z9889 Other specified postprocedural states: Secondary | ICD-10-CM

## 2016-03-15 DIAGNOSIS — T8130XA Disruption of wound, unspecified, initial encounter: Secondary | ICD-10-CM

## 2016-03-15 DIAGNOSIS — Z09 Encounter for follow-up examination after completed treatment for conditions other than malignant neoplasm: Secondary | ICD-10-CM

## 2016-03-15 DIAGNOSIS — M79672 Pain in left foot: Secondary | ICD-10-CM

## 2016-03-15 MED ORDER — HYDROCODONE-ACETAMINOPHEN 10-325 MG PO TABS: 1.0000 | ORAL_TABLET | Freq: Three times a day (TID) | ORAL | 0 refills | Status: DC | PRN

## 2016-03-16 NOTE — Progress Notes (Signed)
Subjective: [name] is a 55 y.o. is seen today in office s/p right 2nd and 3rd tarsometataral and naviculocuneiform arthrodesis preformed on 01/07/16. She states "I am healed". She states that she is doing much better and she feels that the wound is healed and she is also mentioned to walk in the boot and she states that she is having no pain to her right foot walking boot. She states that she feels significantly better than she did prior to surgery and it feels "unbelievable how much better it is not to have pain to this foot". She does continue to take pain medication as her left foot has been hurting quite a bit. She had to have surgery in the left foot after the first year once her right foot is completely healed.  Denies any systemic complaints such as fevers, chills, nausea, vomiting. No calf pain, chest pain, shortness of breath.   Objective: General: No acute distress, AAOx3  DP/PT pulses palpable 2/4, CRT < 3 sec to all digits.  Protective sensation intact. Motor function intact. Right foot: Incision is well coapted without any evidence of dehiscence on the dorsal incision. Scar is well formed. On the medial incision there is sutures intact. The very distal portion still remains somewhat open but it is very superficial. It measures about 0.3 x 0.1 cm. There is no probing, undermining or tunneling is very superficial. There is no swelling erythema, ascending cellulitis, fluctuance, crepitus, malodor. There is no tenderness along the surgical site on the right foot at this time. On the left with her discontinue tenderness on the medial aspect of the foot and the prior surgery site. There is also a bony exostosis palpable the dorsal aspect of the medial foot. Tenderness along this area. No other open lesions or pre-ulcerative lesions.  No pain with calf compression, swelling, warmth, erythema.   Assessment and Plan:  Status post right foot surgery, medial incision wound dehiscence which has  improved with no pain of the right foot however she discontinued have chronic pain in the left foot.  -Treatment options discussed including all alternatives, risks, and complications -X-rays obtained and reviewed of the right foot. Hardware intact. The does appear to be some increased consolidation across the arthrodesis sites. -For now continue Iodosorb to the superficial wound on the right foot monitor for infection. -She consented to weight-bear in the cam boot on the right foot. A surgical shoe was also dispensed that she can wear at night but she should wear the CAM boot. Continue ice and elevation as well. Monitor for infection call the office in medial should any occur.  -Ankle brace for the left side to pain. Continue pain medication as needed. -Follow-up in 2 weeks or sooner if needed. Call any questions or concerns meantime.  *x-ray next appointment   Celesta Gentile, DPM

## 2016-03-18 ENCOUNTER — Encounter: Payer: Self-pay | Admitting: Gastroenterology

## 2016-03-23 MED ORDER — HYDROCODONE-ACETAMINOPHEN 10-325 MG PO TABS: 1.0000 | ORAL_TABLET | Freq: Three times a day (TID) | ORAL | 0 refills | Status: DC | PRN

## 2016-03-23 NOTE — Telephone Encounter (Signed)
She should stay in the surgical boot. I told her at last appointment she was likely not ready to go into the surgical shoe yet. OK to refill vicodin. She should come in for the bruising to make sure the hardware is ok. How is the wound looking?

## 2016-03-25 ENCOUNTER — Encounter: Payer: Self-pay | Admitting: Podiatry

## 2016-03-25 ENCOUNTER — Ambulatory Visit (INDEPENDENT_AMBULATORY_CARE_PROVIDER_SITE_OTHER): Payer: BLUE CROSS/BLUE SHIELD | Admitting: Podiatry

## 2016-03-25 ENCOUNTER — Ambulatory Visit (INDEPENDENT_AMBULATORY_CARE_PROVIDER_SITE_OTHER): Payer: BLUE CROSS/BLUE SHIELD

## 2016-03-25 VITALS — BP 104/69 | HR 87 | Resp 16

## 2016-03-25 DIAGNOSIS — Z9889 Other specified postprocedural states: Secondary | ICD-10-CM

## 2016-03-25 DIAGNOSIS — M79671 Pain in right foot: Secondary | ICD-10-CM

## 2016-03-25 DIAGNOSIS — T8130XA Disruption of wound, unspecified, initial encounter: Secondary | ICD-10-CM | POA: Diagnosis not present

## 2016-03-25 MED ORDER — CEPHALEXIN 500 MG PO CAPS: 500.0000 mg | ORAL_CAPSULE | Freq: Three times a day (TID) | ORAL | 1 refills | Status: DC

## 2016-03-25 NOTE — Progress Notes (Signed)
Subjective:     Patient ID: [name], female   DOB: 04-21-1961, 55 y.o.   MRN: [id number]  HPI patient presents stating her foot was stepped on by 75 pound dog and she admits she's not been mobilizing properly or utilizing compression as she was instructed at this time by Dr. Jacqualyn Posey. There is also some slight redness around her incision site right and she just wanted to watch that as it was traumatized by the dog and she wanted to make sure there was no issues   Review of Systems     Objective:   Physical Exam Neurovascular status was intact negative Homan sign was noted with mild forefoot edema right which is consistent with the surgeries performed. The incision on the dorsal medial aspect does have some redness but I did not note any active drainage I noted crusted tissue but I noted no subcutaneous exposure currently or proximal edema erythema drainage noted. Lateral incisions healing very well    Assessment:     Trauma to the right forefoot localized in nature with possibility for bone injury and mild redness around the incision site right which may have been traumatized secondary to not utilizing compression properly and trauma from the incident with the dog    Plan:     H&P conditions reviewed and at this point as precautionary measure placed her back on cephalexin 500 mg 3 times a day and dispensed Ace wrap with instructions on elevation compression and continued immobilization. Reappoint for regular visit by Dr. Jacqualyn Posey next week or earlier if needed  X-ray report indicates all fixation is in place and it appears to be no increased trauma secondary to the injury she sustained with the dog

## 2016-03-29 ENCOUNTER — Ambulatory Visit (INDEPENDENT_AMBULATORY_CARE_PROVIDER_SITE_OTHER): Payer: BLUE CROSS/BLUE SHIELD | Admitting: Podiatry

## 2016-03-29 DIAGNOSIS — Z9889 Other specified postprocedural states: Secondary | ICD-10-CM

## 2016-03-29 DIAGNOSIS — M79671 Pain in right foot: Secondary | ICD-10-CM

## 2016-03-29 MED ORDER — OXYCODONE-ACETAMINOPHEN 5-325 MG PO TABS: 1.0000 | ORAL_TABLET | Freq: Three times a day (TID) | ORAL | 0 refills | Status: DC | PRN

## 2016-04-01 NOTE — Telephone Encounter (Signed)
I have her percocet at her last appointment. Is she taking that or out of it?

## 2016-04-01 NOTE — Progress Notes (Signed)
Subjective: [name] is a 55 y.o. is seen today in office s/p right 2nd and 3rd tarsometataral and naviculocuneiform arthrodesis preformed on 01/07/16. She states that she is doing much better the right foot. She isn't wearing the surgical shoe but she physical is surgical shoe mature foot hurt more. She has had some swelling to her foot more towards the end of day after working all day. She was seen last appointment by Dr. Paulla Dolly as a dog stepped on her foot and had some bruising. This has resolved. She says that she has no pain of the right foot and she states the top of her right foot feels "great" and she does not have the pain that she is having prior to surgery. However she states that since she is plain were wear her left foot she's been more painful on that side. She states that she would like to have surgical left foot next year however she is taking pain medicine to the left foot pain.   Denies any systemic complaints such as fevers, chills, nausea, vomiting. No calf pain, chest pain, shortness of breath.   Objective: General: No acute distress, AAOx3  DP/PT pulses palpable 2/4, CRT < 3 sec to all digits.  Protective sensation intact. Motor function intact.  Right foot: Incision is well coapted without any evidence of dehiscence on the dorsal incision. Scar is well formed. The incision the medial aspect of the foot is healed as well as no open sores identified. There is no drainage or pus any swelling redness. There is no warmth to the foot. There is no tenderness palpation of the dorsal aspect of the right long the surgical sites or to other areas of the right foot. She has ongoing chronic pain to the left dorsal medial midfoot. Incision from the prior surgeries healed. No other areas of tenderness bilaterally. No other open lesions or pre-ulcerative lesions.  No pain with calf compression, swelling, warmth, erythema.   Assessment and Plan:  Status post right foot surgery, healing; left  chronic foot pain  -Treatment options discussed including all alternatives, risks, and complications -X-rays reviewed from last appointment. There is some consolidation across the arthrodesis site but there is still a radiolucent line evident. However she is clinically not had any pain to the area. The next couple weeks she consented to transition to regular shoe as tolerated for short distances how she is a candidate/surgical shoe well working. 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  -Prescribed Percocet she states the pain to her left foot is worse than the Vicodin is not helping. -Ice and elevation -Ace bandage for compression -Follow-up as scheduled or sooner if needed. Call any questions or concerns in the meantime.  *x-ray next appointment   Celesta Gentile, DPM

## 2016-04-01 NOTE — Telephone Encounter (Signed)
I cannot prescribe both percocet and vicodin at the same time.

## 2016-04-02 ENCOUNTER — Ambulatory Visit (INDEPENDENT_AMBULATORY_CARE_PROVIDER_SITE_OTHER): Payer: BLUE CROSS/BLUE SHIELD | Admitting: Podiatry

## 2016-04-02 ENCOUNTER — Encounter: Payer: Self-pay | Admitting: Podiatry

## 2016-04-02 VITALS — BP 117/65 | HR 89 | Resp 16

## 2016-04-02 DIAGNOSIS — M779 Enthesopathy, unspecified: Secondary | ICD-10-CM | POA: Diagnosis not present

## 2016-04-02 MED ORDER — HYDROCODONE-ACETAMINOPHEN 10-325 MG PO TABS: 1.0000 | ORAL_TABLET | Freq: Four times a day (QID) | ORAL | 0 refills | Status: DC | PRN

## 2016-04-06 NOTE — Telephone Encounter (Signed)
OK to refill tomorrow. Don't fill until she is out the other rx

## 2016-04-06 NOTE — Progress Notes (Signed)
Subjective: Patient presents to the office today for concerns of left foot pain. She states that she is getting a lot of pain to the outside aspect of her left foot and it is difficult to walk or put pressure to the area at times. She also is getting pain along the previous surgical site on the left foot. She states that she is taking pain medication mostly for the left foot and that the right foot is doing better. She denies any drainage or redness or warmth to the right foot. It still swells but she feels that she is slowly improving. Denies any systemic complaints such as fevers, chills, nausea, vomiting. No acute changes since last appointment, and no other complaints at this time.   Objective: AAO x3, NAD DP/PT pulses palpable bilaterally, CRT less than 3 seconds Incisions on the dorsal aspect of the right foot appeared to be well-healed is no drainage or pus. There is moderate edema to the foot with no erythema or increase in warmth. She is wearing surgical shoe today. Today she presents for painful left foot along the lateral aspect of the foot and she points the fifth metatarsal base and just proximal to this area. She states that she has some occasional swelling to this area the area is tender to palpation. Denies any recent injury, however she's been putting more weight on her left foot given the right foot surgery. She also gets pain along the midfoot on the medial aspect of the foot on the left side over the area previous surgery. She states that she is taking pain medicine for the left foot is status post right foot. No open lesions or pre-ulcerative lesions.  No pain with calf compression, swelling, warmth, erythema  Assessment: Peroneal tendinitis left foot   Plan: -All treatment options discussed with the patient including all alternatives, risks, complications.  -This time she is requesting an injection in the left foot. Under sterile conditions a mixture of dexamethasone local  anesthetic was infiltrated around the peroneal tendon on the fifth metatarsal base with care to avoid injecting directly into the tendon. Post injection care was discussed. Continue brace. Ice and elevation. Limit activity.  -Continue offloading boot for the right foot ice and elevation as well. -She said that she does not like taking the Percocet makes her loopy she is requesting Vicodin again.  -Patient encouraged to call the office with any questions, concerns, change in symptoms.   Celesta Gentile, DPM

## 2016-04-07 MED ORDER — HYDROCODONE-ACETAMINOPHEN 10-325 MG PO TABS: 1.0000 | ORAL_TABLET | Freq: Four times a day (QID) | ORAL | 0 refills | Status: DC | PRN

## 2016-04-12 ENCOUNTER — Ambulatory Visit (INDEPENDENT_AMBULATORY_CARE_PROVIDER_SITE_OTHER): Payer: BLUE CROSS/BLUE SHIELD

## 2016-04-12 ENCOUNTER — Ambulatory Visit (INDEPENDENT_AMBULATORY_CARE_PROVIDER_SITE_OTHER): Payer: BLUE CROSS/BLUE SHIELD | Admitting: Podiatry

## 2016-04-12 DIAGNOSIS — M79672 Pain in left foot: Secondary | ICD-10-CM

## 2016-04-12 DIAGNOSIS — M86679 Other chronic osteomyelitis, unspecified ankle and foot: Secondary | ICD-10-CM

## 2016-04-12 DIAGNOSIS — Z09 Encounter for follow-up examination after completed treatment for conditions other than malignant neoplasm: Secondary | ICD-10-CM

## 2016-04-12 DIAGNOSIS — Z9889 Other specified postprocedural states: Secondary | ICD-10-CM

## 2016-04-12 DIAGNOSIS — G8929 Other chronic pain: Secondary | ICD-10-CM

## 2016-04-12 MED ORDER — HYDROCODONE-ACETAMINOPHEN 10-325 MG PO TABS: 1.0000 | ORAL_TABLET | Freq: Four times a day (QID) | ORAL | 0 refills | Status: DC | PRN

## 2016-04-16 ENCOUNTER — Telehealth: Payer: Self-pay | Admitting: *Deleted

## 2016-04-16 DIAGNOSIS — R52 Pain, unspecified: Secondary | ICD-10-CM

## 2016-04-16 NOTE — Telephone Encounter (Addendum)
----- Message from Trula Slade, DPM sent at 04/15/2016  1:20 PM EST ----- Please order venus duplex to rule out DVT right leg- has groin pain. 04/16/2016-Faxed orders to [facility]. 04/19/2016-Pt request refill of Vicodin. 04/19/2016-DrJacqualyn Posey ordered Vicodin 10/325mg  #45 one tablet every 6 hours prn foot pain. Informed pt and she will pick up in the [facility] after 5:00pm. 04/30/2016-[name] presents to collect 01/07/2016 operative notes, POV 1st 01/09/2016, 03/15/2016, and 04/29/2016 and demographics to process for insurance coverage of the Exogen. 05/04/2016-[name] picked up 04/29/2016 clinical notes. 05/18/2016-Pt request refill of her Vicodin. Dr. Berton Lan refill as previously. Pt states [employer] denied the thing he wanted her to have. 05/24/2016-Pt request refill of the Vicodin. Dr. Berton Lan the refill. Informed pt she would be able to pick up as she had planned. 05/31/2016-Pt called for refill of the vicodin. Dr. Jacqualyn Posey states refill as previously. 06/01/2016-Informed pt the hydrocodone rx could be picked up in the [place] office. 06/04/2016-DR. Wagoner states PEER TO PEER with insurance, pt's insurance excludes the bone growth stimulator in her plan, pt has to initiate the appeal process and the process instructions are listed on the 2nd page of the denial letter. I explained to pt Dr. Leigh Aurora instructions and pt states she will discuss with Dr. Jacqualyn Posey at her appt today. 06/07/2016-Pt states she would like a hospital bed semi-automatic with head and foot rest and trapeze bar, to be faxed to [place] [phone]. Faxed rx for hospital bed for pt. 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 06/11/2016-[name], pt's assistant states pt is about to run out of Dilaudid and they would like to pick a rx up today. Shelley ask if burning is unusual in the surgery foot. I  told her no that was the typical surgery pain and pt many need to change leg or body position to just change the stimuli to the area may even dangle for 15 minutes, but not frequently or often. [name] states pt is doing everything right this time. Dr. Jacqualyn Posey ordered Dilaudid 2mg  #40 one or two tablets every 4-6 hours prn foot pain. 0/31/2018-Pt states she and Dr. Jacqualyn Posey had been talking about the burning in her foot after surgery then got off track and she never found out what was causing the burning. 06/17/2016-I informed pt of Dr. Leigh Aurora statement, pt states understanding. Pt states both incisions and the foot are sore, has switched from Dilaudid to Percocet, she states she didn't want to be on that strong Dilaudid for too long.  Pt states sometimes she does have to take a Dilaudid if it gets too bad. Pt states she will need a refill of the Percocet tomorrow. Left message asking how often she had to use the Dilaudid to cover her pain. Pt states she has only take one Dildaudid in 2 days. 06/18/2016-DrJacqualyn Posey states refill the Percocet as previously. Informed pt, she states she will have a friend pick up the rx. 06/24/2016-Pt states she had to fill her pain medication Monday and wanted to know if Dr. Jacqualyn Posey would fill it for tomorrow.07/05/2016-Pt request refill of Percocet. Dr. Jacqualyn Posey ordered Percocet 10/325 #40 2 tablets every 6 hours prn foot pain. Left message informing pt she could pick the rx up in the [place] office. 07/07/2016-Pt states she was going to her neighbor's house just next door and got caught up in the driveway somehow and fell on  her left leg, and hurt her left foot and leg an possibly her right one, she bent the boot. I told pt that, Dr. Jacqualyn Posey was not in tomorrow and I would get her another boot for a friend to pick up and transfer her to schedulers to get in with Dr. Paulla Dolly tomorrow. Pt states understanding.07/12/2016-Pt called for refill of Percocet. Dr. Paulla Dolly ordered refill as  Dr. Jacqualyn Posey orders. Pt called again and asked when did Dr. Jacqualyn Posey want her to go back on the Methotrexate?07/21/2016-Pt requests refill of percocet. Dr. Berton Lan refill as previously. I informed pt the rx would be available for pickup in the [place] office.07/29/2016-Santyl order placed. 07/30/2016-Pt states she needs the pain medicine. Dr.Wagoner okayed refill as previously. I contacted pt to pickup Percocet 10/325mg . 08/03/2016-Pt states Dr. Jacqualyn Posey has her in a boot and her foot burns so bad, she would like the pain medication. Dr. Jacqualyn Posey states refill Oxycodone as previously. I spoke with pt and she states the left foot still has a gash, but looks better that this weekend and when it was red and swollen, she took 2 days of keflex and it looks better and continues to use the Neosporin, pt states the right foot is still swollen and painful like at the last office visit, and the knob where he did the incision still throbs. I told pt I would inform Dr. Jacqualyn Posey and that he had wanted her to use the [employer] Achilles Tendon Cream when the areas had healed. Scanned orders to [employer]. I informed pt Dr. Jacqualyn Posey had wanted to see her and transferred to schedulers.08/10/2016-Pt states she continues to have pain in the surgery foot. Dr. Berton Lan refill of the oxycodone. Informed pt of the refill to be picked up in the [place] office.08/12/2016-Melissa - [facility] states they have not been able to contact pt at either phone number, and wanted to know if Dr. Jacqualyn Posey wanted to continue Santyl orders.08/13/2016-I placed a note on pt's Percocet rx, giving her [facility] contact information. 08/16/2016-Tara - [facility] states they have not been able to contact pt for delivery of Santyl, and would like to know the status of orders at this time.08/17/2016-I informed [name], pt had been prescribed another medication and the foot was healing, and cancelled the Santyl order.08/19/2016-Pt  states she has been walking more this week and getting a cramping pain in the top of her left foot, and would like pain medication. Dr. Jacqualyn Posey ordered Oxycodone 10/325mg #40 ONE TABLET every 6 hours prn foot pain and take OTC Ibuprofen as package directs in between doses of the Oxycodone. 08/27/2016-Pt states she is having a lot of swelling right greater than left, denies streaking, fever or drainage, states doctor at the clinic gave her lasix. Pt states she is still having pain in both feet and request refill of the percocet. Dr. Jacqualyn Posey states refill as on 08/23/2016. Pt will pick up the rx in the [place] office.09/03/2016-Pt states her foot has been giving her a problem and she has had to double up on her medication a time or two and wanted to know if she brought the oxycodone Dr. Jacqualyn Posey wrote on 08/30/2016, would he write her a prescription to be filled for 09/03/2016. Dr. Berton Lan the exchange and ordered Oxycodone 10/325mg  #40 one tablet every 4 hour as needed for pain to be filled 09/03/2016. Left message informing pt she could pick up the rx in the [place] office before 4:00pm. 09/07/2016-Pt states he feet are killing her  and she has been up on them with the horses and yesterday during a birth of a little female horse, pt request to pick up rx today for pain medication that will be filled tomorrow. Dr. Berton Lan.09/17/2016-Pt states she saw Dr.Wagoner yesterday and he was to give her muscle relaxers for foot cramping. Pt states she did PT and yesterday and her feet are really cramping. Dr.Wagoner ordered Robaxin 500mg  #30 one tablet tid, not to be taken with the narcotic pain medication. Orders called to pt with Dr. Leigh Aurora warning. Pt states understanding.09/20/2016-Pt states she is having a lot of pain from the PT and cramping,had to double up on the pills, would like a refill dated for ox today. I informed pt that Dr. Jacqualyn Posey was very upset with the doubling up on the oxycodone 15mg   and she should not do that again. Pt states understanding. I asked pt if she was scheduled for Pain Management and she states the paper work is in and she is waiting on a call if she has not received a call by 09/22/2016 she will call them. I informed Dr. Jacqualyn Posey of Pain Management status. Received fax from [facility] stating Robaxin had been sent to their facility. I changed Robaxin pharmacy to [facility] in [place].09/27/2016-Pt states she would like the status of the Pain Management referral and would like to pick up her pain medication tomorrow. I spoke to pt and told her we had not referred her to Pain Management, Dr. Luanna Salk office had, but I did not see an appt yet. Pt states Dr. Bill Salinas PA was to refer her to Pain Management as a favor, because they do not refer to Pain Management. Pt asked if Dr. Jacqualyn Posey would refer to Pain Management, request [facility]. Dr. Jacqualyn Posey ordered Pain Management for Chronic pain, fibromyalgia, rheumatoid arthritis, and okayed refill of the oxycodone 15mg  as previously. Faxed required referral, clinicals and demographics to [facility]. Pt presented to pick up oxycodone and states the last time it was for oxycodone 20mg  #36 one tablet every 4 hours. I reviewed and pt has right and I destroyed the oxycodone 15mg  rx and reordered oxycodone 20mg  as ordered 09/20/2016.10/01/2016-Pt states [facility] has not received the referral. I showed pt the confirmation the referral and clinicals had been received, and she told The [facility] receptionist. The [facility] receptionist states that is no longer in use, fax to [phone]. Faxed referral, clinicals and demographics to [phone]/05/2016-Left message [facility] asking for the appt status of pt, that Dr. Jacqualyn Posey would like to get her in this week or next.10/06/2016-Pt called states she received a call from our office, "Hey, Marqueta.", not other message. I reviewed notes and did not see that a call had been  documented. I spoke with pt and she states it was Lattie Haw, about the bone growth stimulator, stating it would cost her $5000.00. Pt states she can't afford, but Lattie Haw had a number for her to call and see if could get discounted price. I asked pt if she had received a call from [facility] and she states she did and was to get a call from the New Patient Referral today or tomorrow. I told pt to let me know her status. Pt states understanding.10/12/2016-Pt states she will need to pick up her pain medication today. Dr. Jacqualyn Posey had okayed the refill as previously. I told pt it would be ready for her.10/13/2016-DrJacqualyn Posey states [facility] is offering pt a discounted Exogen,  but have left messages and pt has not returned the calls.10/14/2016-Shelley - [facility] states pt is to call [facility] [phone] to discuss the discounted price for Exogen Bone Growth Stimulator. I informed pt of [facility]'s [facility] [phone]/18/2016-Pt states she needs pain medication refilled. Dr. Berton Lan to refill if time. I informed pt could pick up rx in the [place] office.10/20/2016-DrJacqualyn Posey messaged to start to get pt off of the pain medication, next refill request begin Oxycodone 15mg .10/25/2016-Jon L[facility] Pro called for pain medication. Dr. Jacqualyn Posey ordered Oxycodone 15mg  #36 one tablet 4 times daily. Pt states she had called [facility] and the office was still waiting on the doctor.Left message informing pt Dr. Jacqualyn Posey ordered a change in the Oxycodone, had bumped down 5mg  and instructions were 4 times daily. 11/01/2016-Pt states she needs to pick up the pain medication today to be filled tomorrow. Dr. Berton Lan to be filled 11/02/2016.11/02/2016-Left message requesting status of pt's referral appt to [facility].11/24/2016-Pt left phone message with Bethann Humble requesting pain medication. Dr. Jacqualyn Posey ordered refill as he previously ordered oxycodone 15mg . I informed pt she could pick  up the rx in the [place] office.12/22/2016-Pt states she has severe slicing stabbing pain in the arch of the left foot and if she doesn't hear from anyone today she will call Dr. Paulla Dolly at home on his personal cellphone. I left message informing pt to go back into the air fracture walker, and I would have schedulers call her 1st thing tomorrow morning for an appt tomorrow with Dr. Jacqualyn Posey. Pt called states she is in severe pain. I spoke with pt an told her to go back into the air fracture walker and call tomorrow 1st thing if our schedulers have not called yet. Pt states understanding. 02/01/2017-LaTanya - [facility] states pt's records show, pt to be in pain management. I told LaTanya, pt left [facility] because the doctor had stated there was nothing else he could do for her. Albin Felling states her records have been reviewed and she is being accepted into their practice.02/02/2017-LaTanya - [facility] states pt has been scheduled for 03/09/2017 at 9:45am with Dr. Brandy Hale, pt is aware of appt.

## 2016-04-19 MED ORDER — HYDROCODONE-ACETAMINOPHEN 10-325 MG PO TABS: 1.0000 | ORAL_TABLET | Freq: Three times a day (TID) | ORAL | 0 refills | Status: DC | PRN

## 2016-04-20 NOTE — Progress Notes (Signed)
Subjective: Samarra Toguchi is a 55 y.o. is seen today in office s/p right 2nd and 3rd tarsometataral and naviculocuneiform arthrodesis preformed on 01/07/16. She has been wearing the surgical shoe. She states that the pain is improving. She states today the swelling has improved quite a bit. No redness or warmth to the right foot. She continues to have pain to the left foot but it is somewhat improved but she continues to have pain to the left foot and this is where the majority of pain. Denies any systemic complaints such as fevers, chills, nausea, vomiting. No calf pain, chest pain, shortness of breath.   Objective: General: No acute distress, AAOx3  DP/PT pulses palpable 2/4, CRT < 3 sec to all digits.  Protective sensation intact. Motor function intact.  Right foot: Incision is well coapted without any evidence of dehiscence on the dorsal incision. Scar is well formed. The incision the medial aspect of the foot is healed as well as no open sores identified. There is no drainage or pus any swelling redness. There is no warmth to the foot. Swelling appears to be improved. There is mild tenderness to the surgical site but there are no other areas of tenderness.  She has ongoing chronic pain to the left dorsal medial midfoot. Incision from the prior surgeries healed. There is mild tenderness along the course of the peroneal tendon just proximal to the fifth metatarsal base. No other areas of tenderness in the left foot. No other areas of tenderness bilaterally. No other open lesions or pre-ulcerative lesions.  No pain with calf compression, swelling, warmth, erythema.   Assessment and Plan:  Status post right foot surgery, healing; left chronic foot pain  -Treatment options discussed including all alternatives, risks, and complications -X-rays reviewed from last appointment. There is some consolidation across the arthrodesis site but there is still a radiolucent line evident.  -Overall, her swelling  appears to be much improved in the right foot.She has minimal pain at the surgical site. She is able to transition to a regular shoe as tolerated. If she has recurrence of pain she is to go back into a cam boot and call the office. Discussed with her very gradual transition. Also discussed home exercises for help with rehabilitation. -She would like to have surgery on her left foot however discussed with her we will not do this until her right foot is healed and she is back into his shoe comfortably. -Compression socks arrive today for her. Start this this to help with swelling as well. -Follow-up as scheduled or sooner if needed. Call any questions or concerns in the meantime.  *x-ray next appointment   Celesta Gentile, DPM

## 2016-04-22 ENCOUNTER — Inpatient Hospital Stay (HOSPITAL_COMMUNITY): Admission: RE | Admit: 2016-04-22 | Payer: BLUE CROSS/BLUE SHIELD | Source: Ambulatory Visit

## 2016-04-29 ENCOUNTER — Ambulatory Visit (INDEPENDENT_AMBULATORY_CARE_PROVIDER_SITE_OTHER): Payer: BLUE CROSS/BLUE SHIELD | Admitting: Podiatry

## 2016-04-29 ENCOUNTER — Encounter: Payer: Self-pay | Admitting: Podiatry

## 2016-04-29 ENCOUNTER — Ambulatory Visit (INDEPENDENT_AMBULATORY_CARE_PROVIDER_SITE_OTHER): Payer: BLUE CROSS/BLUE SHIELD

## 2016-04-29 DIAGNOSIS — Z9889 Other specified postprocedural states: Secondary | ICD-10-CM

## 2016-04-29 DIAGNOSIS — S92334K Nondisplaced fracture of third metatarsal bone, right foot, subsequent encounter for fracture with nonunion: Secondary | ICD-10-CM

## 2016-04-29 DIAGNOSIS — M7751 Other enthesopathy of right foot: Secondary | ICD-10-CM | POA: Diagnosis not present

## 2016-04-29 DIAGNOSIS — M779 Enthesopathy, unspecified: Secondary | ICD-10-CM

## 2016-04-29 MED ORDER — HYDROCODONE-ACETAMINOPHEN 10-325 MG PO TABS: 1.0000 | ORAL_TABLET | Freq: Four times a day (QID) | ORAL | 0 refills | Status: DC | PRN

## 2016-04-30 NOTE — Progress Notes (Addendum)
Subjective: [name] is a 55 y.o. is seen today in office s/p right 2nd and 3rd tarsometataral and naviculocuneiform arthrodesis preformed on 01/07/16. She states that she is doing better and her swelling is much improved. She's back into a regular shoe today. She gets some achiness during the day but after she's been on her feet all day at work and cooking and cleaning around the house she does get pain to both of her feet at night for which she does continue to take Vicodin for. Overall she does feel that her foot is much improved compared to what it was prior surgery but she still gets some discomfort.  Denies any systemic complaints such as fevers, chills, nausea, vomiting. No calf pain, chest pain, shortness of breath.   Objective: General: No acute distress, AAOx3  DP/PT pulses palpable 2/4, CRT < 3 sec to all digits.  Protective sensation intact. Motor function intact.  Right foot: Incision is well coapted without any evidence of dehiscence on the dorsal incision. Scar is well formed. There is mild discomfort on the third metatarsal cuneiform joint. The other areas do not appear to elicit any tenderness upon palpation. There is trace edema to the foot and this appears to be much improved. There is no erythema or increase in warmth. She has ongoing chronic pain to the left dorsal medial midfoot. Incision from the prior surgeries healed. There is mild tenderness along the course of the peroneal tendon just proximal to the fifth metatarsal base. No other areas of tenderness in the left foot. No other areas of tenderness bilaterally. No other open lesions or pre-ulcerative lesions.  No pain with calf compression, swelling, warmth, erythema.   Assessment and Plan:  Status post right foot surgery, healing; left chronic foot pain  -Treatment options discussed including all alternatives, risks, and complications -X-rays were obtained and reviewed from today. 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 The hardware is intact.  -She is able to wear regular shoe today. Recommend continue with this. Continue compression sock, ice and elevation. -She again declined physical therapy. -She is also inquiring about surgery left foot. I'll see her back in 4 weeks to see [place] is doing the right foot and she is continuing to improved we will likely order an CT scan or MRI of the left foot in order to start begin the surgical planning the left foot at her request. -Follow-up as scheduled or sooner if needed. Call any questions or concerns in the meantime.  *x-ray next appointment   Celesta Gentile, DPM

## 2016-05-03 ENCOUNTER — Ambulatory Visit: Payer: BLUE CROSS/BLUE SHIELD | Admitting: Podiatry

## 2016-05-12 ENCOUNTER — Other Ambulatory Visit: Payer: Self-pay

## 2016-05-12 MED ORDER — HYDROCODONE-ACETAMINOPHEN 10-325 MG PO TABS: 1.0000 | ORAL_TABLET | Freq: Four times a day (QID) | ORAL | 0 refills | Status: DC | PRN

## 2016-05-18 MED ORDER — HYDROCODONE-ACETAMINOPHEN 10-325 MG PO TABS: 1.0000 | ORAL_TABLET | Freq: Four times a day (QID) | ORAL | 0 refills | Status: DC | PRN

## 2016-05-21 DIAGNOSIS — M19011 Primary osteoarthritis, right shoulder: Secondary | ICD-10-CM | POA: Insufficient documentation

## 2016-05-21 DIAGNOSIS — M19071 Primary osteoarthritis, right ankle and foot: Secondary | ICD-10-CM | POA: Insufficient documentation

## 2016-05-21 DIAGNOSIS — M19042 Primary osteoarthritis, left hand: Secondary | ICD-10-CM

## 2016-05-21 DIAGNOSIS — M0579 Rheumatoid arthritis with rheumatoid factor of multiple sites without organ or systems involvement: Secondary | ICD-10-CM | POA: Insufficient documentation

## 2016-05-21 DIAGNOSIS — M170 Bilateral primary osteoarthritis of knee: Secondary | ICD-10-CM | POA: Insufficient documentation

## 2016-05-21 DIAGNOSIS — M19072 Primary osteoarthritis, left ankle and foot: Secondary | ICD-10-CM

## 2016-05-21 DIAGNOSIS — M19041 Primary osteoarthritis, right hand: Secondary | ICD-10-CM | POA: Insufficient documentation

## 2016-05-21 NOTE — Progress Notes (Deleted)
Office Visit Note  Patient: [name]             Date of Birth: 1961-04-22           MRN: [id number]             PCP: PROVIDER NOT IN SYSTEM Referring: No ref. provider found Visit Date: 05/24/2016 Occupation: @GUAROCC @    Subjective:  No chief complaint on file.   History of Present Illness: [name] is a 56 y.o. female ***   Activities of Daily Living:  Patient reports morning stiffness for *** {minute/hour:19697}.   Patient {ACTIONS;DENIES/REPORTS:21021675::"Denies"} nocturnal pain.  Difficulty dressing/grooming: {ACTIONS;DENIES/REPORTS:21021675::"Denies"} Difficulty climbing stairs: {ACTIONS;DENIES/REPORTS:21021675::"Denies"} Difficulty getting out of chair: {ACTIONS;DENIES/REPORTS:21021675::"Denies"} Difficulty using hands for taps, buttons, cutlery, and/or writing: {ACTIONS;DENIES/REPORTS:21021675::"Denies"}   No Rheumatology ROS completed.   PMFS History:  Patient Active Problem List   Diagnosis Date Noted  . Rheumatoid arthritis involving multiple sites with positive rheumatoid factor ([place]) 05/21/2016  . Primary osteoarthritis of both hands 05/21/2016  . Primary osteoarthritis of both feet 05/21/2016  . Primary osteoarthritis of both knees 05/21/2016  . Primary osteoarthritis of right shoulder 05/21/2016  . Acute pericarditis 04/23/2014  . Rheumatoid arthritis(714.0) 04/11/2012  . History of narcotic addiction ([name]) 03/31/2012  . Fibromyalgia 03/31/2012    Past Medical History:  Diagnosis Date  . Acute pericarditis    a. 04/2014 Cath: nl cors, EF 65-70%;  b. 04/2014 Echo: EF 65-70%, mild TR.  Marland Kitchen Allergy   . Anal fissure   . Anemia   . Anxiety   . Bipolar 1 disorder ([name])   . Chronic lower back pain   . Chronic pain   . Fibromyalgia   . GERD (gastroesophageal reflux disease)   . HLD (hyperlipidemia)   . Hypothyroidism   . RA (rheumatoid arthritis) ([name])    "all over"  . Substance abuse     Family History  Problem Relation Age of Onset  .  Colon polyps Father   . Colon polyps Sister   . Heart attack Maternal Grandfather   . Colon polyps Paternal Grandmother   . Colon cancer Neg Hx    Past Surgical History:  Procedure Laterality Date  . ABDOMINAL HYSTERECTOMY  03/2004  . [place]; 1999  . FOOT SURGERY Right 01/10/2016  . GASTRIC BYPASS  2001  . LEFT HEART CATHETERIZATION WITH CORONARY ANGIOGRAM N/A 04/23/2014   Normal coronaries and LVF  . SHOULDER ARTHROSCOPY W/ ROTATOR CUFF REPAIR Right ~ 2011  . TARSAL METATARSAL FUSION WITH WEIL OSTEOTOMY Left 2013  . TONSILLECTOMY     Social History   Social History Narrative  . No narrative on file     Objective: Vital Signs: There were no vitals taken for this visit.   Physical Exam   Musculoskeletal Exam: ***  CDAI Exam: No CDAI exam completed.    Investigation: Findings:  September 2013 TB gold negative, hepatitis negative, 11/11/2015 CBC normal, CMP normal, lipid panel LDL 147, hemoglobin A1c 5.3, RF 61.8, TSH 8.7 high, vitamin D 35.9    Imaging: Dg Foot Complete Right  Result Date: 05/14/2016 SEE PROGRESS NOTE FOR X-RAY REPORT    Speciality Comments: No specialty comments available.    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  No orders of the  defined types were placed in this encounter.   Face-to-face time spent with patient was *** minutes. 50% of time was spent in counseling and coordination of care.  Follow-Up Instructions: No Follow-up on file.   Bo Merino, MD  Note - This record has been created using Editor, commissioning.  Chart creation errors have been sought, but may not always  have been located. Such creation errors do not reflect on  the standard of medical care.

## 2016-05-24 ENCOUNTER — Ambulatory Visit: Payer: BLUE CROSS/BLUE SHIELD | Admitting: Rheumatology

## 2016-05-24 MED ORDER — HYDROCODONE-ACETAMINOPHEN 10-325 MG PO TABS: 1.0000 | ORAL_TABLET | Freq: Four times a day (QID) | ORAL | 0 refills | Status: DC | PRN

## 2016-05-24 NOTE — Telephone Encounter (Signed)
OK to refill

## 2016-05-28 ENCOUNTER — Ambulatory Visit: Payer: BLUE CROSS/BLUE SHIELD

## 2016-05-28 ENCOUNTER — Encounter: Payer: Self-pay | Admitting: Podiatry

## 2016-05-28 ENCOUNTER — Ambulatory Visit (INDEPENDENT_AMBULATORY_CARE_PROVIDER_SITE_OTHER): Payer: BLUE CROSS/BLUE SHIELD

## 2016-05-28 ENCOUNTER — Ambulatory Visit (INDEPENDENT_AMBULATORY_CARE_PROVIDER_SITE_OTHER): Payer: BLUE CROSS/BLUE SHIELD | Admitting: Podiatry

## 2016-05-28 VITALS — BP 96/60 | HR 88 | Resp 18

## 2016-05-28 DIAGNOSIS — M19079 Primary osteoarthritis, unspecified ankle and foot: Secondary | ICD-10-CM | POA: Diagnosis not present

## 2016-05-28 DIAGNOSIS — G8929 Other chronic pain: Secondary | ICD-10-CM | POA: Diagnosis not present

## 2016-05-28 DIAGNOSIS — M79672 Pain in left foot: Secondary | ICD-10-CM | POA: Diagnosis not present

## 2016-05-28 MED ORDER — TRAMADOL HCL 50 MG PO TABS: 50.0000 mg | ORAL_TABLET | Freq: Three times a day (TID) | ORAL | 0 refills | Status: DC | PRN

## 2016-05-31 MED ORDER — HYDROCODONE-ACETAMINOPHEN 10-325 MG PO TABS: 1.0000 | ORAL_TABLET | Freq: Four times a day (QID) | ORAL | 0 refills | Status: DC | PRN

## 2016-05-31 NOTE — Telephone Encounter (Signed)
OK to refill as long as she is due

## 2016-05-31 NOTE — Progress Notes (Signed)
Subjective: [name] is a 56 y.o. is seen today in office s/p right 2nd and 3rd tarsometataral and naviculocuneiform arthrodesis preformed on 01/07/16. She states the right foot is doing better than it was compared to prior to surgery. She states that "I know I am never going to be completely pain free". She says the swelling has also improved and she is able to wear regular shoe on the right foot. She still gets pain to her foot after being on her feet all day.  Also she presents today for concerns of continued left foot pain. She states that her left foot has been hurting substantially more than the right foot for which she is taking pain medicine for. She states that she doesn't to go ahead and proceed with surgery on the left foot as soon as possible to help with her pain as this has been ongoing daily. She has tried shoes, braces, anti-inflammatories, injections, orthotics without any significant improvement. She is present had a navicular cuneiform fusion.  Denies any systemic complaints such as fevers, chills, nausea, vomiting. No calf pain, chest pain, shortness of breath.   Objective: General: No acute distress, AAOx3  DP/PT pulses palpable 2/4, CRT < 3 sec to all digits.  Protective sensation intact. Motor function intact.  Right foot: Scars a well-formed the dorsal aspect of the foot. There is no specific area of discomfort upon tenderness to palpation on the right foot. There is trace edema to the foot there is no erythema or increase in warmth. The right foot appears to be improved compared to what it was prior to surgery. Left foot: There is continued diffuse pain along the midfoot missing on the medial aspect of the foot along the navicular cuneiform joint and along the foot laterally as well as dorsally. There is no specific area pinpoint bony tenderness however. There is trace edema to the foot there is no erythema or increase in warmth. She is walking with a limp due to the pain to  the left foot. No other open lesions or pre-ulcerative lesions.  No pain with calf compression, swelling, warmth, erythema.   Assessment and Plan:  Status post right foot surgery, healing; left chronic foot pain  -Treatment options discussed including all alternatives, risks, and complications -X-rays were obtained and reviewed from today. 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 The hardware is intact. On the left foot there are significant arthritis of the midfoot. Broken staple present along the previous arthrodesis site. -At this time her right foot is greatly improved. She has declined physical therapy and she is back to a regular shoe. I try to get a bone stimulator for the right side however was denied. -She was given proceed with surgery to the left foot. Prior to surgery will order a CT scan and this is ordered today. -Follow-up after CT scan or sooner if needed. Call any questions or concerns meantime.  Celesta Gentile, DPM

## 2016-06-01 ENCOUNTER — Encounter: Payer: Self-pay | Admitting: Podiatry

## 2016-06-01 NOTE — Progress Notes (Signed)
I called the patients insurance in regards to the denial of the bone stimulator (reference number [id number]). Through the patients policy, bone stimulation for delayed and nonunion is excluded for arthrodesis. The patient can submit an appeal but must come from the patient. Her insurance company is going to generate another letter to send to the patient (apperenatly she already received this in December) on the process of how she can initiate the appeal. Once she initiates it, I can submit information on her behalf. Will discuss with Ms. Lilia Pro.

## 2016-06-02 ENCOUNTER — Inpatient Hospital Stay: Admission: RE | Admit: 2016-06-02 | Payer: BLUE CROSS/BLUE SHIELD | Source: Ambulatory Visit

## 2016-06-03 ENCOUNTER — Ambulatory Visit
Admission: RE | Admit: 2016-06-03 | Discharge: 2016-06-03 | Disposition: A | Discharge status: Home / Self Care | Payer: BLUE CROSS/BLUE SHIELD | Source: Ambulatory Visit | Attending: Podiatry | Admitting: Podiatry

## 2016-06-03 ENCOUNTER — Other Ambulatory Visit: Payer: Self-pay | Admitting: Podiatry

## 2016-06-03 ENCOUNTER — Telehealth: Payer: Self-pay | Admitting: *Deleted

## 2016-06-03 DIAGNOSIS — M19079 Primary osteoarthritis, unspecified ankle and foot: Secondary | ICD-10-CM

## 2016-06-03 NOTE — Telephone Encounter (Signed)
"  [name], thank you."

## 2016-06-04 ENCOUNTER — Ambulatory Visit (INDEPENDENT_AMBULATORY_CARE_PROVIDER_SITE_OTHER): Payer: BLUE CROSS/BLUE SHIELD | Admitting: Podiatry

## 2016-06-04 ENCOUNTER — Encounter: Payer: Self-pay | Admitting: Podiatry

## 2016-06-04 DIAGNOSIS — S92902K Unspecified fracture of left foot, subsequent encounter for fracture with nonunion: Secondary | ICD-10-CM

## 2016-06-04 DIAGNOSIS — M79672 Pain in left foot: Secondary | ICD-10-CM

## 2016-06-04 DIAGNOSIS — M19072 Primary osteoarthritis, left ankle and foot: Secondary | ICD-10-CM | POA: Diagnosis not present

## 2016-06-04 DIAGNOSIS — G8929 Other chronic pain: Secondary | ICD-10-CM

## 2016-06-04 NOTE — Patient Instructions (Signed)

## 2016-06-09 ENCOUNTER — Encounter: Payer: Self-pay | Admitting: Podiatry

## 2016-06-09 DIAGNOSIS — T148XXS Other injury of unspecified body region, sequela: Secondary | ICD-10-CM | POA: Diagnosis not present

## 2016-06-09 DIAGNOSIS — M25775 Osteophyte, left foot: Secondary | ICD-10-CM | POA: Diagnosis not present

## 2016-06-09 DIAGNOSIS — M65872 Other synovitis and tenosynovitis, left ankle and foot: Secondary | ICD-10-CM | POA: Diagnosis not present

## 2016-06-10 NOTE — Progress Notes (Signed)
Subjective: 56 year old female presents the office today for follow-up evaluation of CT scan for continued chronic pain to the left foot. She states the right foot is doing better. She gets intermittent discomfort with a lot of walking she feels overall she is improving. On her left foot she continues to have pain on daily basis and she is continuing to take pain medicine for this foot. She does not feel that she needs pain medicine for her right foot. She has previous treatment and orthotics, bracing without aseptic improvement. She appears he had a navicular cuneiform joint fusion several years ago. She is eager for surgery on the left foot and lifted this next week. Denies any systemic complaints such as fevers, chills, nausea, vomiting. No acute changes since last appointment, and no other complaints at this time.   Objective: AAO x3, NAD DP/PT pulses palpable bilaterally, CRT less than 3 seconds On the right for the incisions are well-healed. There is minimal discomfort along the surgical sites however there is no specific pain she states. There is minimal edema to the area and there is no erythema or increase in warmth. On the left with the majority of tenderness appears to be along the navicular cuneiform joint on the scar from the prior surgery here there is also mild midfoot pain is well. There is tenderness on the lateral aspect of the foot on the fifth metatarsal base and there is slight erythema from irritation from the fifth metatarsal base. There is no opening of the skin. There is no pain with ankle or subtalar joint range of motion there is no other areas of tenderness identified bilaterally. No open lesions or pre-ulcerative lesions.  No pain with calf compression, swelling, warmth, erythema  CT Scan 06/03/16 IMPRESSION: Status post attempted fusion of the articulation of the navicular and medial cuneiform. Fusion staple is broken. There is only a small amount of bridging bone across  the joint. Greater degree of autologous fusion is seen about the articulation of the navicular and middle and lateral cuneiforms.  Mild appearing third and fourth tarsometatarsal degenerative change.  No acute abnormality  Assessment: Left foot osteoarthritis, nonunion; healing right surgical foot  Plan: -All treatment options discussed with the patient including all alternatives, risks, complications.  -CT scan results were discussed the patient. At this time she is a question surgical intervention for the left foot. She feels that the right foot is doing well and she wishes to proceed the left foot surgery. I discussed with her surgical's [place] conservative treatment and at this time she is requesting surgical intervention. -I discussed the revisional navicular cuneiform joint fusion as well as exostectomy of the fifth metatarsal base and tendon repair the peroneal tendon. She understands this is not a guarantee resolution of symptoms and there is a lot of other concerning factors to her foot pain. Also discussed the doing surgeon the left was more stress on the right foot but she feels that she is able to be nonweightbearing on the left foot. -The incision placement as well as the postoperative course was discussed with the patient. I discussed risks of the surgery which include, but not limited to, infection, bleeding, pain, swelling, need for further surgery, delayed or nonhealing, painful or ugly scar, numbness or sensation changes, over/under correction, recurrence, transfer lesions, further deformity, hardware failure, DVT/PE, loss of toe/foot. Patient understands these risks and wishes to proceed with surgery. The surgical consent was reviewed with the patient all 3 pages were signed. No promises or  guarantees were given to the outcome of the procedure. All questions were answered to the best of my ability. Before the surgery the patient was encouraged to call the office if there is any  further questions. The surgery will be performed at the [facility] on an outpatient basis. -She has not yet started the appeal process for the bone stimulator -She again declines PT for the right foot.   Celesta Gentile, DPM -Patient encouraged to call the office with any questions, concerns, change in symptoms.

## 2016-06-11 MED ORDER — HYDROMORPHONE HCL 2 MG PO TABS: ORAL_TABLET | ORAL | 0 refills | Status: DC

## 2016-06-14 ENCOUNTER — Ambulatory Visit (INDEPENDENT_AMBULATORY_CARE_PROVIDER_SITE_OTHER): Payer: Self-pay | Admitting: Podiatry

## 2016-06-14 ENCOUNTER — Ambulatory Visit (INDEPENDENT_AMBULATORY_CARE_PROVIDER_SITE_OTHER): Payer: BLUE CROSS/BLUE SHIELD

## 2016-06-14 DIAGNOSIS — Z9889 Other specified postprocedural states: Secondary | ICD-10-CM

## 2016-06-14 DIAGNOSIS — M19072 Primary osteoarthritis, left ankle and foot: Secondary | ICD-10-CM

## 2016-06-14 MED ORDER — OXYCODONE-ACETAMINOPHEN 10-325 MG PO TABS: 1.0000 | ORAL_TABLET | ORAL | 0 refills | Status: DC | PRN

## 2016-06-15 NOTE — Progress Notes (Signed)
Subjective: [name] is a 56 y.o. is seen today in office s/p left foot naviculo-cuneiform fusion and 5th metatarsal base exostectomy with tendon repair preformed on 06/09/16. They state their pain is improved and she is asking to be switched to percocet and get off of the dilaudid. Denies any systemic complaints such as fevers, chills, nausea, vomiting. No calf pain, chest pain, shortness of breath.   Objective: General: No acute distress, AAOx3  DP/PT pulses palpable 2/4, CRT < 3 sec to all digits.  Protective sensation intact. Motor function intact.  Left foot: Incision is well coapted without any evidence of dehiscence and sutures/staples are intact. There is no surrounding erythema, ascending cellulitis, fluctuance, crepitus, malodor, drainage/purulence. There is mild edema around the surgical site. There is mild pain along the surgical site.  She states that she is doing well she's having minimal pain on the right foot.  No other areas of tenderness to bilateral lower extremities.  No other open lesions or pre-ulcerative lesions.  No pain with calf compression, swelling, warmth, erythema.   Assessment and Plan:  Status post left foot surgery, doing well with no complications   -Treatment options discussed including all alternatives, risks, and complications -X-rays were obtained and reviewed with the patient. Hardware intact. No evidence of acute fracture. -Antibiotic ointment was applied followed by a bandage. Keep the dressing clean, dry, intact.  -Continue nonweightbearing at all times. Must wear CAM boot at all times. Declines cast.  -Ice/elevation -Pain medication as needed. Filled percocet 10/325. She is NOT to take the Dilaudid and she and her family who are with her understand this.  -Monitor for any clinical signs or symptoms of infection and DVT/PE and directed to call the office immediately should any occur or go to the ER. -Follow-up in 1 week for possible SUTURE removal or  sooner if any problems arise. In the meantime, encouraged to call the office with any questions, concerns, change in symptoms.   Celesta Gentile, DPM

## 2016-06-16 NOTE — Telephone Encounter (Signed)
There is a nerve right over the area when that incision was from shaving that bone done on the side of the foot. The nerve is probably inflamed from the swelling around and from the surgery causing this. It should go away, it just takes times for nerve symptoms to resolve.

## 2016-06-17 NOTE — Telephone Encounter (Signed)
How often is she taking the dilaudid and the percocet? How many pills and how often for each?

## 2016-06-18 ENCOUNTER — Telehealth: Payer: Self-pay | Admitting: Podiatry

## 2016-06-18 MED ORDER — OXYCODONE-ACETAMINOPHEN 10-325 MG PO TABS: 1.0000 | ORAL_TABLET | ORAL | 0 refills | Status: DC | PRN

## 2016-06-18 NOTE — Telephone Encounter (Signed)
OK to refill percocet

## 2016-06-18 NOTE — Telephone Encounter (Signed)
Pt called and needs a refill on her pain medication. She has someone that can pick up today.

## 2016-06-21 ENCOUNTER — Ambulatory Visit (INDEPENDENT_AMBULATORY_CARE_PROVIDER_SITE_OTHER): Payer: Self-pay | Admitting: Podiatry

## 2016-06-21 DIAGNOSIS — G8929 Other chronic pain: Secondary | ICD-10-CM

## 2016-06-21 DIAGNOSIS — M79672 Pain in left foot: Secondary | ICD-10-CM

## 2016-06-21 DIAGNOSIS — M19072 Primary osteoarthritis, left ankle and foot: Secondary | ICD-10-CM

## 2016-06-21 DIAGNOSIS — Z9889 Other specified postprocedural states: Secondary | ICD-10-CM

## 2016-06-21 MED ORDER — OXYCODONE-ACETAMINOPHEN 10-325 MG PO TABS: 1.0000 | ORAL_TABLET | ORAL | 0 refills | Status: DC | PRN

## 2016-06-22 ENCOUNTER — Other Ambulatory Visit: Payer: BLUE CROSS/BLUE SHIELD

## 2016-06-22 NOTE — Progress Notes (Signed)
DOS 01.24.2018 Left foot revisional midfoot fusion with plate/screws. Left foot 5th metatarsal base exostectomy (bone spur removal) with tendon repair. Cast/Splint application.

## 2016-06-24 NOTE — Progress Notes (Signed)
Subjective: Kourtlynn Nelms is a 56 y.o. is seen today in office s/p left foot naviculo-cuneiform fusion and 5th metatarsal base exostectomy with tendon repair preformed on 06/09/16. She is continuing to take percocet. She has remained NWB in the CAM boot.  She gets some occasional sharp pains to her foot. The right foot is doing well. Denies any systemic complaints such as fevers, chills, nausea, vomiting. No calf pain, chest pain, shortness of breath.   Objective: General: No acute distress, AAOx3  DP/PT pulses palpable 2/4, CRT < 3 sec to all digits.  Protective sensation intact. Motor function intact.  Left foot: Incision is well coapted without any evidence of dehiscence and sutures/staples are intact. There is no surrounding erythema, ascending cellulitis, fluctuance, crepitus, malodor, drainage/purulence. There is mild edema around the surgical site mostly along the lateral incision. There is mild pain along the surgical site mostly along the lateral 5th metatarsal base incision.   She states that she is doing well she's having minimal pain on the right foot.  No other areas of tenderness to bilateral lower extremities.  No other open lesions or pre-ulcerative lesions.  No pain with calf compression, swelling, warmth, erythema.   Assessment and Plan:  Status post left foot surgery, doing well with no complications   -Treatment options discussed including all alternatives, risks, and complications -Some of the sutures were removed over the lateral incision. ABX ointment and a bandage was applied over the incisions. Keep c/d/i  -Continue nonweightbearing at all times. Must wear CAM boot at all times.   -Ice/elevation -Pain medication as needed. Filled percocet 10/325.  -Monitor for any clinical signs or symptoms of infection and DVT/PE and directed to call the office immediately should any occur or go to the ER. -Follow-up in as scheduled or sooner if any problems arise. In the meantime,  encouraged to call the office with any questions, concerns, change in symptoms.   Celesta Gentile, DPM

## 2016-06-24 NOTE — Telephone Encounter (Signed)
She can get it on Friday as she will run out this weekend. I am putting on the rx the date it can be filled, every 5 days.

## 2016-06-25 MED ORDER — OXYCODONE-ACETAMINOPHEN 10-325 MG PO TABS: 1.0000 | ORAL_TABLET | ORAL | 0 refills | Status: DC | PRN

## 2016-06-28 ENCOUNTER — Ambulatory Visit (INDEPENDENT_AMBULATORY_CARE_PROVIDER_SITE_OTHER): Payer: BLUE CROSS/BLUE SHIELD

## 2016-06-28 ENCOUNTER — Encounter: Payer: Self-pay | Admitting: Podiatry

## 2016-06-28 ENCOUNTER — Other Ambulatory Visit: Payer: Self-pay | Admitting: Podiatry

## 2016-06-28 ENCOUNTER — Ambulatory Visit (INDEPENDENT_AMBULATORY_CARE_PROVIDER_SITE_OTHER): Payer: Self-pay | Admitting: Podiatry

## 2016-06-28 VITALS — BP 115/78 | HR 99 | Resp 16

## 2016-06-28 DIAGNOSIS — Z9889 Other specified postprocedural states: Secondary | ICD-10-CM | POA: Diagnosis not present

## 2016-06-28 DIAGNOSIS — M19072 Primary osteoarthritis, left ankle and foot: Secondary | ICD-10-CM

## 2016-06-28 MED ORDER — NONFORMULARY OR COMPOUNDED ITEM: 2 refills | Status: DC

## 2016-06-28 MED ORDER — OXYCODONE-ACETAMINOPHEN 10-325 MG PO TABS: 2.0000 | ORAL_TABLET | Freq: Four times a day (QID) | ORAL | 0 refills | Status: DC | PRN

## 2016-06-30 NOTE — Progress Notes (Signed)
Subjective: [name] is a 56 y.o. is seen today in office s/p left foot naviculo-cuneiform fusion and 5th metatarsal base exostectomy with tendon repair preformed on 06/09/16. She states that she has been having burning pain directly over the incision along the lateral aspect of the foot. She states it is not shooting up or down the foot/ankle and is localized over the incision. This has worsened over the last 2 days. Because of this she has had to increase her percocet use. She has remained NWB.  Denies any systemic complaints such as fevers, chills, nausea, vomiting. No calf pain, chest pain, shortness of breath.   Objective: General: No acute distress, AAOx3  DP/PT pulses palpable 2/4, CRT < 3 sec to all digits.  Protective sensation intact. Motor function intact.  Left foot: Incision is well coapted without any evidence of dehiscence and sutures/staples are intact. There is no surrounding erythema, ascending cellulitis, fluctuance, crepitus, malodor, drainage/purulence.there is mild edema. The edema to the lateral the foot along the fifth metatarsal base incision appears to actually be improved. There is tenderness on the incision is mostly on the lateral incision. Negative Tinel's sign. The pain that she is experiencing dystrophy along the incision. Sensation intact the fifth toe without any sharp pains down to the foot. No other areas of tenderness to bilateral lower extremities.  No other open lesions or pre-ulcerative lesions.  No pain with calf compression, swelling, warmth, erythema.   Assessment and Plan:  Status post left foot surgery, doing well with no complications   -Treatment options discussed including all alternatives, risks, and complications -X-rays obtained and reviewed. Hardware intact. -Discussed with her suture/staple removal however she was having pain. Shuck to come back later this week to have it removed. We'll reschedule her for Friday she will take Percocet before  coming in. -Continue ice and elevation. -I ordered a compound cream for neuropathy which includes gabapentin to use to help with the neuritis symptoms.I discussed capsaicin cream as well -refilled Percocet today. She has a couple days left and not to get this filled until she runs out.hopefully once given his staples out her pain will improve. -Monitor for any clinical signs or symptoms of infection and DVT/PE and directed to call the office immediately should any occur or go to the ER. -Follow-up in as scheduled or sooner if any problems arise. In the meantime, encouraged to call the office with any questions, concerns, change in symptoms.   Celesta Gentile, DPM

## 2016-07-02 ENCOUNTER — Encounter: Payer: Self-pay | Admitting: Podiatry

## 2016-07-02 ENCOUNTER — Ambulatory Visit (INDEPENDENT_AMBULATORY_CARE_PROVIDER_SITE_OTHER): Payer: BLUE CROSS/BLUE SHIELD | Admitting: Podiatry

## 2016-07-02 DIAGNOSIS — G8929 Other chronic pain: Secondary | ICD-10-CM

## 2016-07-02 DIAGNOSIS — M79672 Pain in left foot: Secondary | ICD-10-CM

## 2016-07-02 DIAGNOSIS — M19072 Primary osteoarthritis, left ankle and foot: Secondary | ICD-10-CM

## 2016-07-02 MED ORDER — OXYCODONE-ACETAMINOPHEN 10-325 MG PO TABS: 2.0000 | ORAL_TABLET | Freq: Four times a day (QID) | ORAL | 0 refills | Status: DC | PRN

## 2016-07-04 NOTE — Progress Notes (Signed)
Subjective: [name] is a 56 y.o. is seen today in office s/p left foot naviculo-cuneiform fusion and 5th metatarsal base exostectomy with tendon repair preformed on 06/09/16.She presents today for staple removal. She states her pain has improved. She does continue with percocet as needed for pain. She has remained NWB.  Denies any systemic complaints such as fevers, chills, nausea, vomiting. No calf pain, chest pain, shortness of breath.   Objective: General: No acute distress, AAOx3 ; presents with [name] a family member  DP/PT pulses palpable 2/4, CRT < 3 sec to all digits.  Protective sensation intact. Motor function intact.  Left foot: Incision is well coapted without any evidence of dehiscence and staples are intact. There is no surrounding erythema, ascending cellulitis, fluctuance, crepitus, malodor, drainage/purulence.there is mild edema. Exam overall unchanged. No other areas of tenderness to bilateral lower extremities.  No other open lesions or pre-ulcerative lesions.  No pain with calf compression, swelling, warmth, erythema.   Assessment and Plan:  Status post left foot surgery, doing well with no complications   -Treatment options discussed including all alternatives, risks, and complications -All of the staples/sutures were removed today without complications. Steri-strips were added to reinforce the incisions. Abx ointment and a bandage applied. She can start to shower on Monday as long as the incisions remained closed and healing well.  -Pain medication prn. Hopefully we can start to decrease this soon.  -Continue ice and elevation. -Compound cream for neuritis as well.  -Monitor for any clinical signs or symptoms of infection and DVT/PE and directed to call the office immediately should any occur or go to the ER. -Follow-up in as scheduled or sooner if any problems arise. In the meantime, encouraged to call the office with any questions, concerns, change in symptoms.    Celesta Gentile, DPM

## 2016-07-05 MED ORDER — OXYCODONE-ACETAMINOPHEN 10-325 MG PO TABS: 2.0000 | ORAL_TABLET | Freq: Four times a day (QID) | ORAL | 0 refills | Status: DC | PRN

## 2016-07-05 NOTE — Telephone Encounter (Signed)
OK to refill and give 40 tabs that should last 5 days.

## 2016-07-08 ENCOUNTER — Ambulatory Visit (INDEPENDENT_AMBULATORY_CARE_PROVIDER_SITE_OTHER): Payer: BLUE CROSS/BLUE SHIELD

## 2016-07-08 ENCOUNTER — Ambulatory Visit (INDEPENDENT_AMBULATORY_CARE_PROVIDER_SITE_OTHER): Payer: BLUE CROSS/BLUE SHIELD | Admitting: Podiatry

## 2016-07-08 ENCOUNTER — Encounter: Payer: Self-pay | Admitting: Podiatry

## 2016-07-08 DIAGNOSIS — Z9889 Other specified postprocedural states: Secondary | ICD-10-CM

## 2016-07-08 DIAGNOSIS — M779 Enthesopathy, unspecified: Secondary | ICD-10-CM | POA: Diagnosis not present

## 2016-07-08 DIAGNOSIS — S99921A Unspecified injury of right foot, initial encounter: Secondary | ICD-10-CM | POA: Diagnosis not present

## 2016-07-08 MED ORDER — OXYCODONE-ACETAMINOPHEN 10-325 MG PO TABS: 2.0000 | ORAL_TABLET | Freq: Four times a day (QID) | ORAL | 0 refills | Status: DC | PRN

## 2016-07-08 MED ORDER — TRIAMCINOLONE ACETONIDE 10 MG/ML IJ SUSP
10.0000 mg | Freq: Once | INTRAMUSCULAR | Status: AC
  Administered 2016-07-08: 10 mg

## 2016-07-08 NOTE — Progress Notes (Signed)
Subjective:     Patient ID: [name], female   DOB: 1961/04/01, 56 y.o.   MRN: [id number]  HPI patient states she fell on her left foot and it's swollen and she's concerned she may have done something to her surgery and she's having a lot of pain on top of her right foot and states it's been 6 months since surgery but she's overusing it because of her left foot   Review of Systems     Objective:   Physical Exam Neurovascular status intact negative Homans sign was noted bilateral with patient found to have well coapted incision sites dorsum bilateral with mild to moderate edema in the center and lateral of the left foot consistent with midtarsal joint fusions. Patient's found to have midtarsal inflammation right around the extensor complex    Assessment:     Tendinitis right dorsal foot with trauma to the left foot    Plan:     H&P x-rays reviewed and at this time I will defer to Dr. Jacqualyn Posey concerning the left foot as he has a better history of her problem. Careful injection administered to the dorsal tendon complex right 3 Milligan Kenalog 5 mill grams Xylocaine and advised on heat ice therapy  X-ray report indicated that the plates are in place and there is a possibility for small fracture of the transverse screw left to be evaluated by Dr. Jacqualyn Posey

## 2016-07-12 MED ORDER — OXYCODONE-ACETAMINOPHEN 10-325 MG PO TABS: ORAL_TABLET | ORAL | 0 refills | Status: DC

## 2016-07-14 ENCOUNTER — Encounter: Payer: Self-pay | Admitting: Gastroenterology

## 2016-07-14 NOTE — Telephone Encounter (Signed)
Yes she can go back on it

## 2016-07-16 ENCOUNTER — Ambulatory Visit (INDEPENDENT_AMBULATORY_CARE_PROVIDER_SITE_OTHER): Payer: Self-pay | Admitting: Podiatry

## 2016-07-16 ENCOUNTER — Ambulatory Visit (INDEPENDENT_AMBULATORY_CARE_PROVIDER_SITE_OTHER): Payer: BLUE CROSS/BLUE SHIELD

## 2016-07-16 DIAGNOSIS — G8929 Other chronic pain: Secondary | ICD-10-CM

## 2016-07-16 DIAGNOSIS — S92902K Unspecified fracture of left foot, subsequent encounter for fracture with nonunion: Secondary | ICD-10-CM

## 2016-07-16 DIAGNOSIS — M79672 Pain in left foot: Secondary | ICD-10-CM

## 2016-07-16 MED ORDER — CEPHALEXIN 500 MG PO CAPS: 500.0000 mg | ORAL_CAPSULE | Freq: Three times a day (TID) | ORAL | 0 refills | Status: DC

## 2016-07-16 MED ORDER — OXYCODONE-ACETAMINOPHEN 10-325 MG PO TABS: ORAL_TABLET | ORAL | 0 refills | Status: DC

## 2016-07-16 NOTE — Patient Instructions (Signed)
Start Keflex 500mg  three times a day until I see you back in 10 days. If the wound on the outside gets any worse to call ASAP or go to the hospital. Wear your surgical boot at all times.

## 2016-07-19 NOTE — Progress Notes (Signed)
Subjective: [name] is a 56 y.o. is seen today in office s/p left foot naviculo-cuneiform fusion and 5th metatarsal base exostectomy with tendon repair preformed on 06/09/16. She presented last week when OUT of the office after she fell while on her knee scooter to the point where she bent her boot. She states that since then she has had some mild increase in Martinez in the outside aspect of the foot. She continues to take Percocet 2 pills every 6 hours. She feels it is getting somewhat better but does continue to become painful. She's had no other recent injury. She also relates that she has been putting weight to her foot without the cam boot and going barefoot. Denies any systemic complaints such as fevers, chills, nausea, vomiting. No calf Martinez, chest Martinez, shortness of breath.   Objective: General: No acute distress, AAOx3 ; presents with [name] a family member  DP/PT pulses palpable 2/4, CRT < 3 sec to all digits.  Protective sensation intact. Motor function intact.  Left foot: Incision is well coapted without any evidence of dehiscence on the dorsal incisions. Along the lateral incision there is superficial skin breakdown through the epidermis. There is no probing, undermining or tunneling. There is a faint amount of erythema and a slight increase in warmth in this area but there is no drainage or pus expressed. There is no ascending cellulitis. There is no malodor. No drainage. Tenderness mostly along the lateral incision which continues. She states that she still gets tingling Martinez to this area but does not shoot down into the toe or up the leg. This tingling is localized directly along the incision. No Martinez of the right foot today. No other open lesions or pre-ulcerative lesions.  No Martinez with calf compression, swelling, warmth, erythema.   Assessment and Plan:  Status post left foot surgery, doing well with no complications   -Treatment options discussed including all alternatives, risks,  and complications -X-rays were obtained and reviewed with the patient. Unable to appreciate any fracture. Hardware intact and the prior surgery. -At this time given the slight opening of the left lateral foot and mild erythema will start antibiotics. We'll start Keflex. She said that she has a full prescription of this at home which she got this refilled previously and did not take it. -Nonweightbearing all times in the cam boot at all times. -Antibiotic ointment and a bandage along the lateral incision daily. -Monitor for any clinical signs or symptoms of infection and directed to call the office immediately should any occur or go to the ER. -Follow-up in  10 days or sooner if any problems arise. In the meantime, encouraged to call the office with any questions, concerns, change in symptoms.   Celesta Gentile, DPM

## 2016-07-21 MED ORDER — OXYCODONE-ACETAMINOPHEN 10-325 MG PO TABS: ORAL_TABLET | ORAL | 0 refills | Status: DC

## 2016-07-26 ENCOUNTER — Encounter: Payer: Self-pay | Admitting: Podiatry

## 2016-07-26 ENCOUNTER — Ambulatory Visit (INDEPENDENT_AMBULATORY_CARE_PROVIDER_SITE_OTHER): Payer: Self-pay | Admitting: Podiatry

## 2016-07-26 DIAGNOSIS — S92902K Unspecified fracture of left foot, subsequent encounter for fracture with nonunion: Secondary | ICD-10-CM

## 2016-07-26 DIAGNOSIS — M779 Enthesopathy, unspecified: Secondary | ICD-10-CM

## 2016-07-26 DIAGNOSIS — T8130XA Disruption of wound, unspecified, initial encounter: Secondary | ICD-10-CM

## 2016-07-26 MED ORDER — OXYCODONE-ACETAMINOPHEN 10-325 MG PO TABS: ORAL_TABLET | ORAL | 0 refills | Status: DC

## 2016-07-26 NOTE — Progress Notes (Addendum)
Subjective: [name] is a 56 y.o. is seen today in office s/p left foot naviculo-cuneiform fusion and 5th metatarsal base exostectomy with tendon repair preformed on 06/09/16. She presents today for follow-up and to evaluate the wound to the lateral foot. She has remained NWB as much as possible and she presents today in a CAM boot using a knee scooter. She states that the wound on the outside of her left foot is doing much better. The redness decreased and her Martinez is improving to this area but she states she still gets Martinez. She has returned to work on a part-time basis. Denies any systemic complaints such as fevers, chills, nausea, vomiting. No calf Martinez, chest Martinez, shortness of breath.   Objective: General: No acute distress, AAOx3 ; presents with [name] a family member  DP/PT pulses palpable 2/4, CRT < 3 sec to all digits.  Protective sensation intact. Motor function intact.  Left foot: Incision is well coapted without any evidence of dehiscence on the dorsal incisions. Along the lateral incision there is superficial skin breakdown through the epidermis.There is a fibro-granular wound base.  The wound measures 1.8 x 0.4 x 0.1 cm.  There is a very minimal amount of erythema and no increase in warmth in this area and there is no drainage or pus expressed. There is no ascending cellulitis. There is no malodor. No drainage. Tenderness mostly along the lateral incision which continues. She states that she still gets tingling Martinez to this area but does not shoot down into the toe or up the leg. This tingling is localized directly along the incision which continues.  She has had some intermittent Martinez to the right foot which is likely due to pressure from the left foot surgery.  No Martinez of the right foot today. No other open lesions or pre-ulcerative lesions.  No Martinez with calf compression, swelling, warmth, erythema.   Assessment and Plan:  Status post left foot surgery, healing ulcer left  foot  -Treatment options discussed including all alternatives, risks, and complications -Will switch to santyl dressing changes. Ordered today. Or now continue with antibiotic ointment dressing changes daily -Will hold any further antibiotics.  -Nonweightbearing all times in the cam boot at all times. -Awaiting bone stimulator approval. This has been submitted several times.  -Monitor for any clinical signs or symptoms of infection and directed to call the office immediately should any occur or go to the ER. -Follow-up in  10 days or sooner if any problems arise. In the meantime, encouraged to call the office with any questions, concerns, change in symptoms.   Celesta Gentile, DPM

## 2016-07-29 ENCOUNTER — Telehealth: Payer: Self-pay | Admitting: *Deleted

## 2016-07-29 MED ORDER — COLLAGENASE 250 UNIT/GM EX OINT: 1.0000 "application " | TOPICAL_OINTMENT | Freq: Every day | CUTANEOUS | 3 refills | Status: DC

## 2016-07-29 NOTE — Telephone Encounter (Addendum)
-----   Message from Trula Slade, DPM sent at 07/28/2016  8:20 PM EDT ----- Can you please order santyl for her? 07/29/2016-Faxed

## 2016-07-30 MED ORDER — OXYCODONE-ACETAMINOPHEN 10-325 MG PO TABS: ORAL_TABLET | ORAL | 0 refills | Status: DC

## 2016-08-03 MED ORDER — NONFORMULARY OR COMPOUNDED ITEM: 2 refills | Status: DC

## 2016-08-03 MED ORDER — OXYCODONE-ACETAMINOPHEN 10-325 MG PO TABS: ORAL_TABLET | ORAL | 0 refills | Status: DC

## 2016-08-03 NOTE — Telephone Encounter (Signed)
It should. How is the wound doing to the outside of the foot? Any shooting pain down the foot or up the leg? Also can you order the [employer] achilles tendon cream? She can use this once the wound is healed. It has gabapentin in it which may help as well as anti-inflammatory.

## 2016-08-03 NOTE — Telephone Encounter (Signed)
If it is infected she needs to come in. The sharp pain should go away as she continues to heel.

## 2016-08-04 ENCOUNTER — Ambulatory Visit (INDEPENDENT_AMBULATORY_CARE_PROVIDER_SITE_OTHER): Payer: Self-pay | Admitting: Podiatry

## 2016-08-04 ENCOUNTER — Ambulatory Visit (INDEPENDENT_AMBULATORY_CARE_PROVIDER_SITE_OTHER): Payer: BLUE CROSS/BLUE SHIELD

## 2016-08-04 DIAGNOSIS — S92902K Unspecified fracture of left foot, subsequent encounter for fracture with nonunion: Secondary | ICD-10-CM | POA: Diagnosis not present

## 2016-08-04 MED ORDER — OXYCODONE-ACETAMINOPHEN 10-325 MG PO TABS: ORAL_TABLET | ORAL | 0 refills | Status: DC

## 2016-08-04 MED ORDER — COLLAGENASE 250 UNIT/GM EX OINT: 1.0000 "application " | TOPICAL_OINTMENT | Freq: Every day | CUTANEOUS | 3 refills | Status: DC

## 2016-08-06 ENCOUNTER — Telehealth: Payer: Self-pay | Admitting: *Deleted

## 2016-08-06 NOTE — Telephone Encounter (Signed)
Chris with [facility] called and stated pt brought in a Rx for Oxycodone today and pharmacist denied stating it was too soon to fill. He states that pt just filled a 5 day supply of Oxycodone yesterday at a [facility]. He said pt got upset and come back and got the Rx. He just wanted to let us know what was going on.

## 2016-08-09 ENCOUNTER — Encounter: Payer: BLUE CROSS/BLUE SHIELD | Admitting: Podiatry

## 2016-08-09 ENCOUNTER — Ambulatory Visit: Payer: BLUE CROSS/BLUE SHIELD | Admitting: Podiatry

## 2016-08-09 ENCOUNTER — Encounter: Payer: Self-pay | Admitting: Podiatry

## 2016-08-09 NOTE — Progress Notes (Signed)
I called her insurance company regards to prior authorization and approval for bone stimulator to the right foot. They said they did not receive the patient's appeal. I spoke today with Coralee Pesa in our office and she is going to refax the appeal and updated clinical notes.

## 2016-08-09 NOTE — Progress Notes (Signed)
Subjective: [name] is a 56 y.o. is seen today in office s/p left foot naviculo-cuneiform fusion and 5th metatarsal base exostectomy with tendon repair preformed on 06/09/16. She states that overall she is doing better but she still gets sharp pain along the incision to lateral foot. She feels that this is doing better and she is not had any redness. She has not received the Santyl. She denies any drainage or pus. She denies any red streaking. She is remaining nonweightbearing in a CAM boot as much as possible but she does put weight on her foot. She feels that the boot is rubbing on the incision causing pain. Denies any systemic complaints such as fevers, chills, nausea, vomiting. No calf pain, chest pain, shortness of breath.   Objective: General: No acute distress, AAOx3 ; presents with [name] a family member  DP/PT pulses palpable 2/4, CRT < 3 sec to all digits.  Protective sensation intact. Motor function intact.  Left foot: Incision is well coapted without any evidence of dehiscence on the dorsal incisions. Along the lateral incision there is superficial skin breakdown through the epidermis.There is a fibro-granular wound base.  The wound measures 1.4 x 0.2 x 0.1 cm.  There is a very no significant erythema and no increase in warmth in this area and there is no drainage or pus expressed. There is no ascending cellulitis. There is no malodor. No drainage. Tenderness mostly along the lateral incision which continues to have somewhat improved compared to last appointment. She states that she still gets tingling pain to this area but does not shoot down into the toe or up the leg. This tingling is localized directly along the incision which continues.  She has had some intermittent pain to the right foot which is likely due to pressure from the left foot surgery.  No pain of the right foot today. No other open lesions or pre-ulcerative lesions.  No pain with calf compression, swelling, warmth,  erythema.   Assessment and Plan:  Status post left foot surgery, healing wound dehiscence left foot  -Treatment options discussed including all alternatives, risks, and complications -X-rays were obtained and reviewed today. The does appear to be increased consolidation across the arthrodesis site. No evidence of acute fracture. -She does not get the Santyl. I reported this to a regular pharmacy today. I can discussed with her application instructions. I debrided the boot is causing irritation elected dispensed a surgical shoe that she can wear for short distances. She can start to partial weight-bear as tolerated. There is any increase in pain to decrease this and go back to nonweightbearing. Although the infection appears to be improved to lateral foot as the wound is continued we will continue with one more round of antibiotics as she had has a refill for this and we'll restart this. Still awaiting bone stimulator approval. -Monitor for any clinical signs or symptoms of infection and directed to call the office immediately should any occur or go to the ER. -Follow-up in  10 days or sooner if any problems arise. In the meantime, encouraged to call the office with any questions, concerns, change in symptoms.   Celesta Gentile, DPM

## 2016-08-10 MED ORDER — OXYCODONE-ACETAMINOPHEN 10-325 MG PO TABS: ORAL_TABLET | ORAL | 0 refills | Status: DC

## 2016-08-12 MED ORDER — OXYCODONE-ACETAMINOPHEN 10-325 MG PO TABS: ORAL_TABLET | ORAL | 0 refills | Status: DC

## 2016-08-12 NOTE — Telephone Encounter (Signed)
Please let her know that they have been trying to call

## 2016-08-15 ENCOUNTER — Other Ambulatory Visit: Payer: Self-pay | Admitting: Podiatry

## 2016-08-15 DIAGNOSIS — M79673 Pain in unspecified foot: Secondary | ICD-10-CM

## 2016-08-15 MED ORDER — OXYCODONE-ACETAMINOPHEN 10-325 MG PO TABS: ORAL_TABLET | ORAL | 0 refills | Status: DC

## 2016-08-15 NOTE — Progress Notes (Signed)
Patient called stating that the pain prescription given by Marcy Siren did not have a dosage associated and the pharmacy denied it. New Rx provided today.   Edrick Kins, DPM [facility]  Dr. Edrick Kins, [address]                                        [place], [place] [address]                Office [phone]  Fax [phone]

## 2016-08-16 ENCOUNTER — Ambulatory Visit (INDEPENDENT_AMBULATORY_CARE_PROVIDER_SITE_OTHER): Payer: Self-pay | Admitting: Podiatry

## 2016-08-16 ENCOUNTER — Encounter: Payer: Self-pay | Admitting: Podiatry

## 2016-08-16 DIAGNOSIS — Z9889 Other specified postprocedural states: Secondary | ICD-10-CM

## 2016-08-16 DIAGNOSIS — M79672 Pain in left foot: Secondary | ICD-10-CM

## 2016-08-16 DIAGNOSIS — G8929 Other chronic pain: Secondary | ICD-10-CM

## 2016-08-16 DIAGNOSIS — T8130XA Disruption of wound, unspecified, initial encounter: Secondary | ICD-10-CM

## 2016-08-18 NOTE — Progress Notes (Signed)
Subjective: [name] is a 56 y.o. is seen today in office s/p left foot naviculo-cuneiform fusion and 5th metatarsal base exostectomy with tendon repair preformed on 06/09/16. She did get the Santyl she has been applying the santyl to the left foot and she felt this has been working in the wound is looking much better. She states the cream to her and her first and she started using that however this is starting to subside. She denies any drainage or pus and denies any redness or warmth. Overall her foot pain has improved some but she is continuing to take Percocet. She states her right foot is been swollen as well but the pain is improved. She has restarted methotrexate as well. She does continue to work on a part-time basis. Denies any systemic complaints such as fevers, chills, nausea, vomiting. No calf pain, chest pain, shortness of breath.   Objective: General: No acute distress, AAOx3 ; presents alone today. DP/PT pulses palpable 2/4, CRT < 3 sec to all digits.  Protective sensation intact. Motor function intact.  Left foot: Incision is well coapted without any evidence of dehiscence on the dorsal incisions. Along the lateral incision there is superficial skin breakdown through the epidermis and overall the wound looks much improved today. Almost appears to be a small superficial skin fissure present. Measures approximately 1.5 x 0.1 x 0.1. It is more granular and there is no fibrotic tissue. There is no drainage or pus. There is no surrounding erythema, ascending cellulitis. Does not function, crepitus, malodor. No other open lesions are identified. There does appear to be some mild edema to bilateral feet. There is no erythema to the right side. She has mild tenderness on the surgical sites and the right foot however overalls improving she states. No other open lesions or pre-ulcerative lesions.  No pain with calf compression, swelling, warmth, erythema.   Assessment and Plan:  Status post left  foot surgery, healing wound left foot  -Treatment options discussed including all alternatives, risks, and complications -Recommended continuous Santyl dressing changes daily as is his been helping. Also continue with surgical shoe not putting weight on her foot however she has started to put weight on her left foot that she must with a cam boot. She was just given a prescription for Percocet 09/17/2023. Hopefully we can downgrade her to Vicodin to see how this will do. Discussed that we need to start weaning off of the pain medicine. She agrees to this but we will see how she is doing this week when she needs a refill.  -Still awaiting bone stimulator approval -Monitor for any clinical signs or symptoms of infection and directed to call the office immediately should any occur or go to the ER. -RTC in 2 weeks or sooner if needed.  Celesta Gentile, DPM    Celesta Gentile, DPM

## 2016-08-19 MED ORDER — OXYCODONE-ACETAMINOPHEN 10-325 MG PO TABS: 1.0000 | ORAL_TABLET | Freq: Three times a day (TID) | ORAL | 0 refills | Status: DC | PRN

## 2016-08-19 MED ORDER — OXYCODONE-ACETAMINOPHEN 10-325 MG PO TABS: 1.0000 | ORAL_TABLET | Freq: Four times a day (QID) | ORAL | 0 refills | Status: DC | PRN

## 2016-08-23 ENCOUNTER — Other Ambulatory Visit: Payer: Self-pay | Admitting: Podiatry

## 2016-08-23 MED ORDER — OXYCODONE-ACETAMINOPHEN 10-325 MG PO TABS: 1.0000 | ORAL_TABLET | ORAL | 0 refills | Status: DC | PRN

## 2016-08-23 NOTE — Progress Notes (Signed)
Patient called on Sunday 08/22/16 stating that she had to go to [employer] with family for the weekend and she had to do a lot of walking and she was on her foot a lot more. Because of this she was not able to start weaning down on the percocet and she called to get a refill of percocet in the morning. I refilled percocet for her today however we again discussed she needs to take it easy and gradually start to increase weightbearing and she needs to starting decreasing the amount of pain medication that she is taking. She verbalized understanding.

## 2016-08-27 MED ORDER — OXYCODONE-ACETAMINOPHEN 10-325 MG PO TABS: 1.0000 | ORAL_TABLET | ORAL | 0 refills | Status: DC | PRN

## 2016-08-30 ENCOUNTER — Ambulatory Visit (INDEPENDENT_AMBULATORY_CARE_PROVIDER_SITE_OTHER): Payer: BLUE CROSS/BLUE SHIELD | Admitting: Podiatry

## 2016-08-30 ENCOUNTER — Encounter: Payer: Self-pay | Admitting: Podiatry

## 2016-08-30 VITALS — BP 95/68 | HR 84 | Resp 16

## 2016-08-30 DIAGNOSIS — G8929 Other chronic pain: Secondary | ICD-10-CM

## 2016-08-30 DIAGNOSIS — T8130XA Disruption of wound, unspecified, initial encounter: Secondary | ICD-10-CM

## 2016-08-30 DIAGNOSIS — M79672 Pain in left foot: Secondary | ICD-10-CM

## 2016-08-30 DIAGNOSIS — M79671 Pain in right foot: Secondary | ICD-10-CM

## 2016-08-30 MED ORDER — OXYCODONE-ACETAMINOPHEN 10-325 MG PO TABS: 1.0000 | ORAL_TABLET | ORAL | 0 refills | Status: DC | PRN

## 2016-09-01 NOTE — Progress Notes (Signed)
Subjective: [name] is a 56 y.o. is seen today in office s/p left foot naviculo-cuneiform fusion and 5th metatarsal base exostectomy with tendon repair preformed on 06/09/16. She has been using the santyl and it has been helping on the lateral left foot. The pain to this area has improved but overall she is still getting pain to the feet. She is more concerned about the swelling to her feet and ankles, more at the end of the day. She was on lasix but she has not been taking this. She is trying to wean off of the percocet.Her pain is worse in the morning and gets somewhat better during the day. She just got her last rx filled Sunday so she will be due Saturday. She does continue to work on a part-time basis. Denies any systemic complaints such as fevers, chills, nausea, vomiting. No calf pain, chest pain, shortness of breath.   Objective: General: No acute distress, AAOx3 ; presents alone today. DP/PT pulses palpable 2/4, CRT < 3 sec to all digits.  Presents today with a CAM boot on the left foot and a slip on shoe on the right.  Protective sensation intact. Motor function intact.  Left foot: Incision is well coapted without any evidence of dehiscence on the dorsal incisions. Along the lateral incision there is superficial skin breakdown through the epidermis and overall the wound looks much improved today. Almost appears to be a small superficial skin fissure present. Measures approximately 0.6 x 0.1 x 0.1. It is more granular and is starting to scab over. The wound looks much improved today. There is no drainage or pus. There is no surrounding erythema, ascending cellulitis. There is no fluctuance, crepitus, malodor. No other open lesions are identified. There does appear to be some mild edema to bilateral feet but the majority of edema is to the lateral ankle. No pain over this area. There is no erythema to the right side. She has mild tenderness on the surgical sites and the right foot however overalls  improving she states. No other open lesions or pre-ulcerative lesions.  No pain with calf compression, swelling, warmth, erythema.   Assessment and Plan:  Status post left foot surgery, healing wound left foot; right foot pain  -Treatment options discussed including all alternatives, risks, and complications -Recommended to continue with Santyl dressing changes daily as is his been helping and the ulcer looks much improved.  -She has not been taking her lasix and recommended her to do so per her PCP. Continue with compression stockings. She states she has been wearing these but she does not have them on when she comes in to her appointments.  -I have tried to get the bone stimulator approved but unfortunately due to insurance this is not covered.  -Will start PT- rx provided today for [employer].  She agrees to it today.  -Rx provided for pain medication but NOT TO BE FILLED UNTIL SHE IS DUE AND THIS WAS WRITTEN ON THE PRESCRIPTION -Monitor for any clinical signs or symptoms of infection and directed to call the office immediately should any occur or go to the ER. -RTC in 3 weeks or sooner if needed.  Celesta Gentile, DPM

## 2016-09-03 MED ORDER — OXYCODONE-ACETAMINOPHEN 10-325 MG PO TABS: 1.0000 | ORAL_TABLET | ORAL | 0 refills | Status: DC | PRN

## 2016-09-07 MED ORDER — OXYCODONE-ACETAMINOPHEN 10-325 MG PO TABS: 1.0000 | ORAL_TABLET | Freq: Four times a day (QID) | ORAL | 0 refills | Status: DC | PRN

## 2016-09-07 MED ORDER — OXYCODONE-ACETAMINOPHEN 10-325 MG PO TABS: 1.0000 | ORAL_TABLET | ORAL | 0 refills | Status: DC | PRN

## 2016-09-10 ENCOUNTER — Telehealth: Payer: Self-pay | Admitting: Podiatry

## 2016-09-10 ENCOUNTER — Other Ambulatory Visit: Payer: Self-pay

## 2016-09-10 MED ORDER — OXYCODONE-ACETAMINOPHEN 10-325 MG PO TABS: 1.0000 | ORAL_TABLET | ORAL | 0 refills | Status: DC | PRN

## 2016-09-10 NOTE — Telephone Encounter (Signed)
Pt asked if you would give her a call.

## 2016-09-11 ENCOUNTER — Encounter: Payer: Self-pay | Admitting: Sports Medicine

## 2016-09-11 ENCOUNTER — Telehealth: Payer: Self-pay | Admitting: Sports Medicine

## 2016-09-11 NOTE — Telephone Encounter (Signed)
Patient called stating that she is trying to get her pain medication refilled but the pharmacisit will not let her refill it. The pharmacist said she just picked up pain medication on 4-25 and is now trying to get more pain medication on today. I thanked the pharmacist for letting me know and declined any additional refill for pain medication until she follows up with her doctor. -Dr. Cannon Kettle

## 2016-09-11 NOTE — Progress Notes (Signed)
Patient called again about pain medication and I restated to her that she can not get a refill on Percocet since she just got a prescription on 4-25. Patient reports that she is doubling up. I advised patient of the risks over overdose and to take it as instructed. I discussed case with Dr. Jacqualyn Posey and sent to her pharmacy Tramadol 50 mg q4h prn pain for breakthrough 12 tablets.  Dr [employer]

## 2016-09-12 NOTE — Telephone Encounter (Signed)
[name] spoke to the patient.

## 2016-09-15 ENCOUNTER — Ambulatory Visit (INDEPENDENT_AMBULATORY_CARE_PROVIDER_SITE_OTHER): Payer: BLUE CROSS/BLUE SHIELD | Admitting: Rheumatology

## 2016-09-15 ENCOUNTER — Encounter: Payer: Self-pay | Admitting: Rheumatology

## 2016-09-15 VITALS — BP 106/72 | HR 101 | Resp 18 | Ht 65.5 in | Wt 269.0 lb

## 2016-09-15 DIAGNOSIS — M19041 Primary osteoarthritis, right hand: Secondary | ICD-10-CM

## 2016-09-15 DIAGNOSIS — Z79899 Other long term (current) drug therapy: Secondary | ICD-10-CM | POA: Diagnosis not present

## 2016-09-15 DIAGNOSIS — M19072 Primary osteoarthritis, left ankle and foot: Secondary | ICD-10-CM | POA: Diagnosis not present

## 2016-09-15 DIAGNOSIS — F1121 Opioid dependence, in remission: Secondary | ICD-10-CM

## 2016-09-15 DIAGNOSIS — M19071 Primary osteoarthritis, right ankle and foot: Secondary | ICD-10-CM

## 2016-09-15 DIAGNOSIS — M170 Bilateral primary osteoarthritis of knee: Secondary | ICD-10-CM | POA: Diagnosis not present

## 2016-09-15 DIAGNOSIS — M797 Fibromyalgia: Secondary | ICD-10-CM | POA: Diagnosis not present

## 2016-09-15 DIAGNOSIS — M059 Rheumatoid arthritis with rheumatoid factor, unspecified: Secondary | ICD-10-CM

## 2016-09-15 DIAGNOSIS — M19042 Primary osteoarthritis, left hand: Secondary | ICD-10-CM

## 2016-09-15 LAB — CBC WITH DIFFERENTIAL/PLATELET
BASOS PCT: 1 %
Basophils Absolute: 96 cells/uL (ref 0–200)
EOS ABS: 96 {cells}/uL (ref 15–500)
Eosinophils Relative: 1 %
HEMATOCRIT: 39.2 % (ref 35.0–45.0)
Hemoglobin: 13.1 g/dL (ref 11.7–15.5)
Lymphocytes Relative: 19 %
Lymphs Abs: 1824 cells/uL (ref 850–3900)
MCH: 31.7 pg (ref 27.0–33.0)
MCHC: 33.4 g/dL (ref 32.0–36.0)
MCV: 94.9 fL (ref 80.0–100.0)
MONO ABS: 672 {cells}/uL (ref 200–950)
MPV: 10.4 fL (ref 7.5–12.5)
Monocytes Relative: 7 %
NEUTROS ABS: 6912 {cells}/uL (ref 1500–7800)
Neutrophils Relative %: 72 %
Platelets: 298 10*3/uL (ref 140–400)
RBC: 4.13 MIL/uL (ref 3.80–5.10)
RDW: 13.8 % (ref 11.0–15.0)
WBC: 9.6 10*3/uL (ref 3.8–10.8)

## 2016-09-15 NOTE — Progress Notes (Signed)
Office Visit Note  Patient: [name]             Date of Birth: [birth year]           MRN: [id number]             PCP: PROVIDER NOT IN SYSTEM Referring: No ref. provider found Visit Date: 09/15/2016 Occupation: @GUAROCC @    Subjective:  Follow-up (total body pain)   History of Present Illness: Desiray Orchard is a 56 y.o. female   Last seen 12/16/2015.  Today, patient has told the nurse that she is hurting all over. She carries a diagnosis of positive rheumatoid factor, negative ANA and negative CCP. She also has fibromyalgia syndrome.  Currently, these of the medications she is using: Include Lyrica 400mg  q AM, methotrexate 1 mL subcutaneously, folic acid 2 mg a day, Risperdal, Cymbalta 120mg  qd (Rx'd by her doctor at work), Lasix and Protonix.  Patient is complaining of pain all over today. She states that both of her knees and feet hurt. Bilateral shoulder circumflex. She has a history of getting right shoulder joint surgery done through Dr. Archie Endo office. She's having left shoulder joint pain and discomfort and it locks at times keeping her from doing certain range of motion movements. Patient rates her pain and discomfort as 8 on a scale of 0-10.  Patient seeing foot doctor, dr wagnor, podiatrist,([facility]) and has had foot surgery on both feet. Right foot --> took out 3 joints and put in rods and screws. Left foot --> revised previous surgery from 8 years ago and put in rods, fixed tendon, removed broken screws.(done end of January 2018).    Activities of Daily Living:  Patient reports morning stiffness for 40 minutes.   Patient Reports nocturnal pain.  Difficulty dressing/grooming: Reports Difficulty climbing stairs: Reports Difficulty getting out of chair: Reports Difficulty using hands for taps, buttons, cutlery, and/or writing: Reports   Review of Systems  Constitutional: Positive for fatigue.  HENT: Negative for mouth sores and mouth dryness.     Eyes: Negative for dryness.  Respiratory: Negative for shortness of breath.   Gastrointestinal: Negative for constipation and diarrhea.  Musculoskeletal: Positive for myalgias and myalgias.  Skin: Negative for sensitivity to sunlight.  Psychiatric/Behavioral: Positive for sleep disturbance. Negative for decreased concentration.    PMFS History:  Patient Active Problem List   Diagnosis Date Noted  . Rheumatoid arthritis involving multiple sites with positive rheumatoid factor ([place]) 05/21/2016  . Primary osteoarthritis of both hands 05/21/2016  . Primary osteoarthritis of both feet 05/21/2016  . Primary osteoarthritis of both knees 05/21/2016  . Primary osteoarthritis of right shoulder 05/21/2016  . Acute pericarditis 04/23/2014  . Rheumatoid arthritis ([place]) 04/11/2012  . History of narcotic addiction ([place]) 03/31/2012  . Fibromyalgia 03/31/2012    Past Medical History:  Diagnosis Date  . Acute pericarditis    a. 04/2014 Cath: nl cors, EF 65-70%;  b. 04/2014 Echo: EF 65-70%, mild TR.  Marland Kitchen Allergy   . Anal fissure   . Anemia   . Anxiety   . Bipolar 1 disorder ([place])   . Chronic lower back pain   . Chronic pain   . Fibromyalgia   . GERD (gastroesophageal reflux disease)   . HLD (hyperlipidemia)   . Hypothyroidism   . RA (rheumatoid arthritis) ([place])    "all over"  . Substance abuse     Family History  Problem Relation Age of Onset  . Colon polyps Father   .  Colon polyps Sister   . Heart attack Maternal Grandfather   . Colon polyps Paternal Grandmother   . Colon cancer Neg Hx    Past Surgical History:  Procedure Laterality Date  . ABDOMINAL HYSTERECTOMY  03/2004  . [place]; 1999  . FOOT SURGERY Right 01/10/2016  . GASTRIC BYPASS  2001  . LEFT HEART CATHETERIZATION WITH CORONARY ANGIOGRAM N/A 04/23/2014   Normal coronaries and LVF  . SHOULDER ARTHROSCOPY W/ ROTATOR CUFF REPAIR Right ~ 2011  . TARSAL METATARSAL FUSION WITH WEIL OSTEOTOMY Left 2013  .  TONSILLECTOMY    . TOTAL SHOULDER ARTHROPLASTY     Social History   Social History Narrative  . No narrative on file     Objective: Vital Signs: BP 106/72 (BP Location: Left Arm, Patient Position: Sitting, Cuff Size: Normal)   Pulse (!) 101   Resp 18   Ht 5' 5.5" (1.664 m)   Wt 269 lb (122 kg)   BMI 44.08 kg/m    Physical Exam  Constitutional: She is oriented to person, place, and time. She appears well-developed and well-nourished.  HENT:  Head: Normocephalic and atraumatic.  Eyes: EOM are normal. Pupils are equal, round, and reactive to light.  Cardiovascular: Normal rate, regular rhythm and normal heart sounds.  Exam reveals no gallop and no friction rub.   No murmur heard. Pulmonary/Chest: Effort normal and breath sounds normal. She has no wheezes. She has no rales.  Abdominal: Soft. Bowel sounds are normal. She exhibits no distension. There is no tenderness. There is no guarding. No hernia.  Musculoskeletal: Normal range of motion. She exhibits no edema, tenderness or deformity.  Lymphadenopathy:    She has no cervical adenopathy.  Neurological: She is alert and oriented to person, place, and time. Coordination normal.  Skin: Skin is warm and dry. Capillary refill takes less than 2 seconds. No rash noted.  Psychiatric: She has a normal mood and affect. Her behavior is normal.  Nursing note and vitals reviewed.    Musculoskeletal Exam:  Decreased range of motion of bilateral shoulder joint at 90 of abduction Decreased extension and flexion of wrist. Fibromyalgia tender points are 18 out of 18 positive  CDAI Exam: CDAI Homunculus Exam:   Joint Counts:  CDAI Tender Joint count: 0 CDAI Swollen Joint count: 0     Investigation: No additional findings.  Scan labs Mar 04, 2017:  CBC with differential is within normal limits  CMP with GFR is within normal limits except for mild elevation of potassium at 5.3, mild decrease in sodium at 93  Rheumatoid factor  elevated at 54.9  TSH elevated at 5.25  Imaging: No results found.  Speciality Comments: No specialty comments available.    Procedures:  No procedures performed Allergies: Elavil [amitriptyline]   Assessment / Plan:     Visit Diagnoses: Rheumatoid arthritis with positive rheumatoid factor, involving unspecified site (HCC) - Positive rheumatoid factor, negative ANA; negative CCP  History of narcotic addiction (HCC) - DO NOT PRESCRIBE NARCOTICS PER DR. Estanislado Pandy  Fibromyalgia  Primary osteoarthritis of both hands  Primary osteoarthritis of both feet  Primary osteoarthritis of both knees  High risk medications (not anticoagulants) long-term use - Plan: CBC with Differential/Platelet, COMPLETE METABOLIC PANEL WITH GFR   Plan: #1: Rheumatoid arthritis. Positive rheumatoid factor, negative ANA, negative CCP.  #2: High risk prescription. On methotrexate 1 mL per week Folic acid 2 mg every day CBC with differential and CMP with GFR were last done October 2017 (  please see scanned images); patient is past due for labs today and we will draw them in office.  #3: Bilateral feet surgery. Please see history of present illness for full details. Please see note in Epic. Patient's podiatrist was Dr. Earleen Newport who did the surgery. Most recent surgery was January. There is also consideration of wound dehiscence and there is office visit on 08/30/2016 for that  #4: Fibromyalgia syndrome. Patient rates her discomfort as 8-9 on a scale of 0-10 "I'm hurting all over". I believe her joint pain and her various pain is coming from fiber myalgia  #5: Weight loss. She's taking QS Y MIA (combination of phentermine and 2 para make) Prescribed by Dr. Daiva Eves. Patient had the medicines filled at [facility]. Medicine is dated 09/01/2016. She has 90 pills prescribed.  #6:  Schedule patient for ultrasound bilateral hands and wrist to rule out synovitis. I believe the  methotrexate is working well but we need to confirm this and if appropriate adjust her rheumatoid arthritis medication for her joint pain can be addressed if it is coming from rheumatoid arthritis. My suspicion is that it's osteoarthritis and fibromyalgia.  Orders: Orders Placed This Encounter  Procedures  . CBC with Differential/Platelet  . COMPLETE METABOLIC PANEL WITH GFR   No orders of the defined types were placed in this encounter.   Face-to-face time spent with patient was 30 minutes. 50% of time was spent in counseling and coordination of care.  Follow-Up Instructions: Return in about 5 months (around 02/15/2017) for RA,, FMS, FATIGUE,INSOMNIA.   Eliezer Lofts, PA-C  Note - This record has been created using [employer].  Chart creation errors have been sought, but may not always  have been located. Such creation errors do not reflect on  the standard of medical care.

## 2016-09-16 ENCOUNTER — Ambulatory Visit (INDEPENDENT_AMBULATORY_CARE_PROVIDER_SITE_OTHER): Payer: BLUE CROSS/BLUE SHIELD

## 2016-09-16 ENCOUNTER — Telehealth: Payer: Self-pay | Admitting: *Deleted

## 2016-09-16 ENCOUNTER — Ambulatory Visit (INDEPENDENT_AMBULATORY_CARE_PROVIDER_SITE_OTHER): Payer: Self-pay | Admitting: Podiatry

## 2016-09-16 DIAGNOSIS — M19072 Primary osteoarthritis, left ankle and foot: Secondary | ICD-10-CM

## 2016-09-16 DIAGNOSIS — T8130XA Disruption of wound, unspecified, initial encounter: Secondary | ICD-10-CM

## 2016-09-16 DIAGNOSIS — R52 Pain, unspecified: Secondary | ICD-10-CM

## 2016-09-16 DIAGNOSIS — M779 Enthesopathy, unspecified: Secondary | ICD-10-CM | POA: Diagnosis not present

## 2016-09-16 DIAGNOSIS — S99921A Unspecified injury of right foot, initial encounter: Secondary | ICD-10-CM

## 2016-09-16 LAB — COMPLETE METABOLIC PANEL WITH GFR
ALT: 9 U/L (ref 6–29)
AST: 12 U/L (ref 10–35)
Albumin: 3.8 g/dL (ref 3.6–5.1)
Alkaline Phosphatase: 61 U/L (ref 33–130)
BILIRUBIN TOTAL: 0.3 mg/dL (ref 0.2–1.2)
BUN: 11 mg/dL (ref 7–25)
CALCIUM: 8.7 mg/dL (ref 8.6–10.4)
CO2: 22 mmol/L (ref 20–31)
CREATININE: 0.97 mg/dL (ref 0.50–1.05)
Chloride: 106 mmol/L (ref 98–110)
GFR, EST AFRICAN AMERICAN: 77 mL/min (ref >=60)
GFR, Est Non African American: 67 mL/min (ref >=60)
Glucose, Bld: 79 mg/dL (ref 65–99)
Potassium: 5.2 mmol/L (ref 3.5–5.3)
Sodium: 138 mmol/L (ref 135–146)
Total Protein: 6.2 g/dL (ref 6.1–8.1)

## 2016-09-16 MED ORDER — OXYCODONE HCL 15 MG PO TABS: 15.0000 mg | ORAL_TABLET | ORAL | 0 refills | Status: DC | PRN

## 2016-09-16 NOTE — Telephone Encounter (Signed)
Narcotic pain management.

## 2016-09-16 NOTE — Telephone Encounter (Signed)
I had contacted patient to review lab results with patient. She states we were going to refer her to pain management. I did not see anything in your last not note about pain management. Were you planning on referring her?

## 2016-09-16 NOTE — Telephone Encounter (Signed)
I SENT A MESSAGE TO [facility] FOR REFERRAL TO DR SPIVEY  IF YOU CAN REFER PT, THAT WOULD BE GREAT.

## 2016-09-16 NOTE — Telephone Encounter (Signed)
What is the reason to associate with the referral please?

## 2016-09-16 NOTE — Telephone Encounter (Signed)
Patient advised referral has been placed.

## 2016-09-17 MED ORDER — METHOCARBAMOL 500 MG PO TABS: 500.0000 mg | ORAL_TABLET | Freq: Three times a day (TID) | ORAL | 0 refills | Status: DC | PRN

## 2016-09-20 ENCOUNTER — Ambulatory Visit: Payer: BLUE CROSS/BLUE SHIELD | Admitting: Podiatry

## 2016-09-20 ENCOUNTER — Telehealth: Payer: Self-pay | Admitting: Podiatry

## 2016-09-20 MED ORDER — OXYCODONE HCL 20 MG PO TABS: 20.0000 mg | ORAL_TABLET | Freq: Four times a day (QID) | ORAL | 0 refills | Status: DC | PRN

## 2016-09-20 NOTE — Progress Notes (Signed)
Subjective: [name] is a 56 y.o. is seen today in office s/p left foot naviculo-cuneiform fusion and 5th metatarsal base exostectomy with tendon repair preformed on 06/09/16. She states that she is having quite a bit of pain to both of her feet but overall she is having severe pain to her shoulders and to other parts of her entire body. She did see a rheumatologist this morning and they put in for a consult for pain management. She states that she has tightness in her left foot into the toes in the morning she first gets up and take some effort to get going. She is also starting physical therapy but she is going on for 1 session so far. She states they work on both feet as she is having pain in both of her feet. She states that she has been unable to go down and her pain medication. Denies any systemic complaints such as fevers, chills, nausea, vomiting. No calf pain, chest pain, shortness of breath.   Objective: General: No acute distress, AAOx3 ; presents alone today. DP/PT pulses palpable 2/4, CRT < 3 sec to all digits.  Presents today with a CAM boot on the left foot and a slip on shoe on the right.  Protective sensation intact. Motor function intact.  Left foot: Incision is well coapted without any evidence of dehiscence on the dorsal incisions. Along the lateral incision this incision is also healed. There is no edema, erythema, drainage or pus along this incision. There is no clinical signs of infection. Overall there is diffuse tenderness to bilateral feet mostly along the midfoot. Swelling to the feet x-ray. Be improved compared to last appointment but does continue mostly to the ankles. She takes Lasix intermittently. No other open lesions or pre-ulcerative lesions.  No pain with calf compression, swelling, warmth, erythema.   Assessment and Plan:  Status post left foot surgery, healing wound left foot; right foot pain  -Treatment options discussed including all alternatives, risks, and  complications -At this time the incisions are healed. -Continue with physical therapy. -Continue to try for a bone stimulator. 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 -Prescribed oxycodone 15 mg every 4 hours. We can also try a muscle relaxer. I am glad that a pain management referral was already placed as I was going to suggest this today as well.  -Continue compression socks but she is not wearing them again today. Also Lasix as discussed by her primary care physician. -Monitor for any clinical signs or symptoms of infection and directed to call the office immediately should any occur or go to the ER. -RTC in 3 weeks or sooner if needed.  Celesta Gentile, DPM

## 2016-09-20 NOTE — Telephone Encounter (Signed)
She should NOT be doubling this medication. I have her 15mg  oxycdone. Do the oxycodone 20mg  1 every 4 hours. Disp 36. See if she has been able to schedule with pain management.

## 2016-09-20 NOTE — Telephone Encounter (Signed)
Pt. Asked if you would give her a call

## 2016-09-22 ENCOUNTER — Telehealth: Payer: Self-pay | Admitting: Rheumatology

## 2016-09-22 NOTE — Telephone Encounter (Signed)
Also, We CANNOT Give Any Narcotics either.  She can have her PCP refer her to other pain doctors if she needs any future pain management.

## 2016-09-22 NOTE — Telephone Encounter (Signed)
Returned call to [name]. She states Dr. Vira Blanco accepted the patient. When [name] called to schedule the patient for an appointment and let the patient know that Dr. Vira Blanco will accept her but will not prescribe her narcotics patient declined the appointment.

## 2016-09-22 NOTE — Telephone Encounter (Signed)
[facility] called about referral that was sent over on patient. Please call [name] at [phone]

## 2016-09-28 MED ORDER — OXYCODONE HCL 15 MG PO TABS: 15.0000 mg | ORAL_TABLET | ORAL | 0 refills | Status: DC | PRN

## 2016-09-28 MED ORDER — OXYCODONE HCL 20 MG PO TABS: ORAL_TABLET | ORAL | 0 refills | Status: DC

## 2016-09-29 ENCOUNTER — Telehealth (INDEPENDENT_AMBULATORY_CARE_PROVIDER_SITE_OTHER): Payer: Self-pay | Admitting: Rheumatology

## 2016-09-29 NOTE — Telephone Encounter (Signed)
COPY OF ALL RECORDS MAILED TO PATIENT

## 2016-10-01 ENCOUNTER — Ambulatory Visit (INDEPENDENT_AMBULATORY_CARE_PROVIDER_SITE_OTHER): Payer: BLUE CROSS/BLUE SHIELD

## 2016-10-01 ENCOUNTER — Ambulatory Visit (INDEPENDENT_AMBULATORY_CARE_PROVIDER_SITE_OTHER): Payer: Self-pay | Admitting: Podiatry

## 2016-10-01 DIAGNOSIS — M19072 Primary osteoarthritis, left ankle and foot: Secondary | ICD-10-CM | POA: Diagnosis not present

## 2016-10-01 DIAGNOSIS — G8929 Other chronic pain: Secondary | ICD-10-CM

## 2016-10-01 DIAGNOSIS — Z9889 Other specified postprocedural states: Secondary | ICD-10-CM

## 2016-10-01 DIAGNOSIS — M79673 Pain in unspecified foot: Secondary | ICD-10-CM

## 2016-10-01 MED ORDER — OXYCODONE HCL 20 MG PO TABS: ORAL_TABLET | ORAL | 0 refills | Status: DC

## 2016-10-04 NOTE — Progress Notes (Signed)
Subjective: [name] is a 56 y.o. is seen today in office s/p left foot naviculo-cuneiform fusion and 5th metatarsal base exostectomy with tendon repair preformed on 06/09/16. She's been on a physical therapy for both of her feet and she feels this has been helping. She states that her feet are sore today but she has been on them quite a bit and she's had another doctor's appointment as well as physical therapy as well as working. She states that she feels melancholy today because she did see a dermatologist their concern for possible skin cancer and she has some biopsies done today. She does continue the Lasix daily as well. She has not been wearing compression socks. She contains in the surgical site and the left lobe at a regular shoe on the right foot. Denies any systemic complaints such as fevers, chills, nausea, vomiting. No calf pain, chest pain, shortness of breath.   Objective: General: No acute distress, AAOx3 ; presents alone today. DP/PT pulses palpable 2/4, CRT < 3 sec to all digits.  Presents today with a CAM boot on the left foot and a slip on shoe on the right.  Protective sensation intact. Motor function intact.  Left foot: Incision is well coapted without any evidence of dehiscence on the dorsal incisions. There is continued discomfort on the midfoot however she states that it is slowly improving. She also gets pain on the midfoot on the right foot as well on midfoot. There is no specific area pinpoint tenderness there is no pain vibratory sensation. The some mild chronic pain edema to bilateral lower extremities. Is no erythema or increase in warmth. No other open lesions or pre-ulcerative lesions.  No pain with calf compression, swelling, warmth, erythema.   Assessment and Plan:  Status post   -Treatment options bilateral foot surgery with residual midfoot pain  discussed including all alternatives, risks, and complications -X-rays were obtained and reviewed today. Appear to be  some increased consolidation on the left foot navicular cuneiform joint hardware is intact. No evidence of acute fracture. -At this time the incisions are healed. -Continue with physical therapy. -She is still taking a lot of pain medicine. I had a long discussion with her in regards this we got to start getting her off the pain medicine but she feels that she still needs pain medicine. She also has pain and multiple other areas of her body I discussed that I'm not treat her for this pain. She needs to get in to see pain management. We have sent a referral in as well and I recommend her to follow up with them as well leaving her incision. -I tried for bone sooner on multiple occasions however her insurance does not cover this.  -Monitor for any clinical signs or symptoms of infection and directed to call the office immediately should any occur or go to the ER. -RTC in as scheduled or sooner if needed.  Celesta Gentile, DPM

## 2016-10-12 ENCOUNTER — Telehealth: Payer: Self-pay | Admitting: Podiatry

## 2016-10-12 MED ORDER — OXYCODONE HCL 20 MG PO TABS
ORAL_TABLET | ORAL | 0 refills | Status: DC
Start: 2016-10-12 — End: 2017-10-31

## 2016-10-12 NOTE — Telephone Encounter (Signed)
Wants to know if she can come by today to get her prescription because it runs out tomorrow. Stated she is still waiting to hear back from [facility]. Stated she tried calling Thursday or Friday and still has not heard anything. Is still trying to contact them. Requested a call back.

## 2016-10-13 ENCOUNTER — Encounter: Payer: Self-pay | Admitting: Podiatry

## 2016-10-13 NOTE — Progress Notes (Signed)
Spoke with Lytle Michaels from [facility] in regards to her bone stimulator. He stats their patient advocates have called on numerous occasions without any success and she has not returned her call. Cranford Mon, CMA also tried calling her last week in regards to this with a phone number she needs to call but unable to reach her.

## 2016-10-18 MED ORDER — OXYCODONE HCL 20 MG PO TABS
ORAL_TABLET | ORAL | 0 refills | Status: DC
Start: 2016-10-18 — End: 2017-10-31

## 2016-10-25 MED ORDER — OXYCODONE HCL 15 MG PO TABS: ORAL_TABLET | ORAL | 0 refills | Status: DC

## 2016-10-29 ENCOUNTER — Ambulatory Visit: Payer: BLUE CROSS/BLUE SHIELD | Admitting: Podiatry

## 2016-11-01 MED ORDER — OXYCODONE HCL 15 MG PO TABS
ORAL_TABLET | ORAL | 0 refills | Status: DC
Start: 2016-11-02 — End: 2017-10-31

## 2016-11-03 ENCOUNTER — Telehealth: Payer: Self-pay | Admitting: *Deleted

## 2016-11-05 ENCOUNTER — Ambulatory Visit (INDEPENDENT_AMBULATORY_CARE_PROVIDER_SITE_OTHER): Payer: BLUE CROSS/BLUE SHIELD | Admitting: Podiatry

## 2016-11-05 DIAGNOSIS — G8929 Other chronic pain: Secondary | ICD-10-CM

## 2016-11-05 DIAGNOSIS — M79673 Pain in unspecified foot: Secondary | ICD-10-CM

## 2016-11-05 DIAGNOSIS — M79671 Pain in right foot: Secondary | ICD-10-CM

## 2016-11-05 MED ORDER — OXYCODONE HCL 15 MG PO TABS: 15.0000 mg | ORAL_TABLET | ORAL | 0 refills | Status: DC | PRN

## 2016-11-05 NOTE — Telephone Encounter (Addendum)
Pt states [facility] states they did not get the referral. I faxed referral 10/01/2016 with pt in office. I faxed to the fax number given to the pt when she spoke with [place] [phone], the same as had been faxed to on 10/01/2016.

## 2016-11-07 NOTE — Progress Notes (Signed)
Subjective: [name] is a 56 y.o. is seen today in office s/p left foot naviculo-cuneiform fusion and 5th metatarsal base exostectomy with tendon repair preformed on 06/09/16. She does continue with percocet 4 times a day but does admit to taking it more at times. She states that she still gets pain to the tops of her feet. She states it is not worse than before surgery. She actually today seems to be in better spirits and she states that she has had some days of no pain. She did not get the bone stimulator as she states that her trust has ran out and she cannot afford it no matter how discounted it is. She also stopped going to PT as the physical therapist said she told them it was a waste of time and money. She has been wearing regular shoes. No recent injury.  Denies any systemic complaints such as fevers, chills, nausea, vomiting. No calf pain, chest pain, shortness of breath.   Objective: General: No acute distress, AAOx3 ; presents alone today wearing a Croc shoe.  DP/PT pulses palpable 2/4, CRT < 3 sec to all digits.  Presents today with a CAM boot on the left foot and a slip on shoe on the right.  Protective sensation intact. Motor function intact.  Left foot: Incision is well coapted without any evidence of dehiscence on the dorsal incisions. There is continued tenderness On the dorsal aspect of the midfoot on both feet.There is minimal edema to the of the foot. There is chronic edema to the ankles bilaterally without any erythema or increase in warmth. There is no pain on the plantar aspect of the foot or along the plantar aspect of the midfoot. There is no clinical signs of infection. No other area of tenderness is identified. No other open lesions or pre-ulcerative lesions.  No pain with calf compression, swelling, warmth, erythema.   Assessment and Plan:  Status post   -Treatment options bilateral foot surgery with residual midfoot pain  discussed including all alternatives, risks, and  complications -Today we had a long discussion in regards to her pain medication and other treatment options to help with pain. She is awaiting Pain management evaluation. We have faxed over information to the pain clinic several of times and Alivia is aware. She went by the office today. She is going to go again this afternoon to follow-up they received all the information. If needed we can send her records through certified mail to ensure they get them.  -She declines further PT and bone stiumulator -Will try for a home TENS unit -Will have her come back to get molded for custom molded orthotics. Discussed shoe changes. -We discussed her pain a lot today. She states that the pain is not worse than before the surgery. However, it does not seem the surgery helped much either unfortunately. I did discuss it can take up to 1 year to see the full results of surgery and she is aware of that. She states that she has had pain to her feet for several of years. I believe her other co-morbidities contribute a lot to her pain as well. Prior to the surgeries she had a high tolerance to pain medication. Discussed I will not increase her pain medication anymore and she is aware of that. She needs to get into pain management.  -Will have her come back to see Liliane Channel for [place].   Celesta Gentile, DPM   -X-rays were obtained and reviewed today. Appear to be some  increased consolidation on the left foot navicular cuneiform joint hardware is intact. No evidence of acute fracture. -At this time the incisions are healed. -Continue with physical therapy. -She is still taking a lot of pain medicine. I had a long discussion with her in regards this we got to start getting her off the pain medicine but she feels that she still needs pain medicine. She also has pain and multiple other areas of her body I discussed that I'm not treat her for this pain. She needs to get in to see pain management. We have sent a referral in as well and I  recommend her to follow up with them as well leaving her incision. -I tried for bone sooner on multiple occasions however her insurance does not cover this.  -Monitor for any clinical signs or symptoms of infection and directed to call the office immediately should any occur or go to the ER. -RTC in as scheduled or sooner if needed.  Celesta Gentile, DPM

## 2016-11-08 ENCOUNTER — Ambulatory Visit: Payer: BLUE CROSS/BLUE SHIELD | Admitting: Orthotics

## 2016-11-08 ENCOUNTER — Other Ambulatory Visit: Payer: Self-pay | Admitting: Podiatry

## 2016-11-08 DIAGNOSIS — G8929 Other chronic pain: Secondary | ICD-10-CM

## 2016-11-08 DIAGNOSIS — M19072 Primary osteoarthritis, left ankle and foot: Secondary | ICD-10-CM

## 2016-11-08 DIAGNOSIS — M79671 Pain in right foot: Secondary | ICD-10-CM

## 2016-11-08 DIAGNOSIS — M79673 Pain in unspecified foot: Principal | ICD-10-CM

## 2016-11-08 MED ORDER — OXYCODONE HCL 15 MG PO TABS: 15.0000 mg | ORAL_TABLET | ORAL | 0 refills | Status: DC | PRN

## 2016-11-15 ENCOUNTER — Other Ambulatory Visit: Payer: Self-pay

## 2016-11-15 MED ORDER — OXYCODONE HCL 15 MG PO TABS: 15.0000 mg | ORAL_TABLET | ORAL | 0 refills | Status: DC | PRN

## 2016-11-15 NOTE — Progress Notes (Signed)
Per Dr Paulla Dolly, patient is ok to have pain medications but just enough to get her through until she sees pain management. Orders for pain management have been faxed over on 10/01/16. Patient states that pain management doctor is out of town until 2nd week in July, but she should be going to see him next week.  Please see previous documentation.

## 2016-11-15 NOTE — Telephone Encounter (Signed)
I need a refill on my pain medication and would like to come get it around noon/lunch time.

## 2016-11-19 ENCOUNTER — Other Ambulatory Visit: Payer: Self-pay

## 2016-11-19 ENCOUNTER — Telehealth: Payer: Self-pay | Admitting: Podiatry

## 2016-11-19 MED ORDER — OXYCODONE HCL 15 MG PO TABS: 15.0000 mg | ORAL_TABLET | Freq: Four times a day (QID) | ORAL | 0 refills | Status: DC | PRN

## 2016-11-19 NOTE — Telephone Encounter (Signed)
Pt called requesting a refill of her pain medication. States it runs out tomorrow. Said she can come get it today as she is at the hospital with her sister.

## 2016-11-19 NOTE — Progress Notes (Signed)
Pt called stating that she was still in a lot of pain. She said that her sister was currently in the hospital with a perforated bowel and she has been doig a lot of walking which has made her pain worse.  She states that she has an appointment next week with pain management next week. Advised patient that she needed to keep her appt with pain management and that we were going to decrease her pain medication. Patient states understanding and has agreed to continue with seeking help through pain management

## 2016-11-24 MED ORDER — OXYCODONE HCL 15 MG PO TABS: ORAL_TABLET | ORAL | 0 refills | Status: DC

## 2016-11-24 NOTE — Telephone Encounter (Signed)
I informed pt Dr. Berton Lan the refill of the oxycodone 15mg  as previously.

## 2016-11-24 NOTE — Telephone Encounter (Signed)
Pt called requesting an refill of her pain medication. States she is going to pain management this afternoon because she called them Monday and they have not responded to her.

## 2016-11-25 ENCOUNTER — Telehealth: Payer: Self-pay | Admitting: Podiatry

## 2016-11-25 NOTE — Telephone Encounter (Signed)
This message is for [facility]. Marcy Siren, if you could please call me back at [phone] so I can explain what is going on with pain management to you.

## 2016-11-25 NOTE — Telephone Encounter (Signed)
Pt states went to [facility] and they said they did not have her records, and she told them the records had been faxed. [facility] got a Freight forwarder on the line and the doctor will be in office on Monday and will review her record that is on his desk and she should get a call on Tuesday.

## 2016-11-29 ENCOUNTER — Telehealth: Payer: Self-pay | Admitting: Podiatry

## 2016-11-29 MED ORDER — OXYCODONE HCL 15 MG PO TABS: ORAL_TABLET | ORAL | 0 refills | Status: DC

## 2016-11-29 NOTE — Telephone Encounter (Signed)
Req refill of pain Rx and wants to pick it up today.

## 2016-11-29 NOTE — Telephone Encounter (Signed)
I informed pt she could pick up the percocet in the [facility].

## 2016-11-30 NOTE — Progress Notes (Signed)
Patient came into today to be cast for [employer]. Upon recommendation of Dr. Jacqualyn Posey Patient presents with bialteral foot pain Goals are arch support, rear foot stability Plan vendor [employer]

## 2016-12-06 ENCOUNTER — Telehealth: Payer: Self-pay | Admitting: Podiatry

## 2016-12-06 DIAGNOSIS — G8929 Other chronic pain: Secondary | ICD-10-CM

## 2016-12-06 DIAGNOSIS — M79673 Pain in unspecified foot: Principal | ICD-10-CM

## 2016-12-06 MED ORDER — OXYCODONE HCL 15 MG PO TABS: ORAL_TABLET | ORAL | 0 refills | Status: DC

## 2016-12-06 NOTE — Telephone Encounter (Signed)
If you could have [name] call me back please. I wanted to talk to her about this pain management Dr. Davene Costain?

## 2016-12-06 NOTE — Addendum Note (Signed)
Addended by: Harriett Sine D on: 12/06/2016 11:15 AM   Modules accepted: Orders

## 2016-12-06 NOTE — Telephone Encounter (Addendum)
Pt states Dr. Davene Costain at [facility] has not looked at her records, pt states she would like to be referred to [facility], [facility] Referral Coordinator states can have pt's record reviewed in less than 1 week, and she needs her pain medication. Faxed referral, clinicals and demographics to [place].

## 2016-12-13 ENCOUNTER — Telehealth: Payer: Self-pay | Admitting: Podiatry

## 2016-12-13 MED ORDER — OXYCODONE HCL 15 MG PO TABS: ORAL_TABLET | ORAL | 0 refills | Status: DC

## 2016-12-13 NOTE — Telephone Encounter (Signed)
This call is for [facility]. I wanted to know if she could get my Rx ready for me to get today because I'm unable to come tomorrow. Its the end of the month and I have to work late. Also, I have an appointment "yay" with pain management on 08 August at 1:00 pm. So just get her to call me back.

## 2016-12-13 NOTE — Telephone Encounter (Signed)
I informed pt she could pick up the Oxycodone in the [place] office.

## 2016-12-17 ENCOUNTER — Ambulatory Visit: Payer: BLUE CROSS/BLUE SHIELD | Admitting: Podiatry

## 2016-12-20 ENCOUNTER — Telehealth: Payer: Self-pay | Admitting: Podiatry

## 2016-12-20 MED ORDER — OXYCODONE HCL ER 15 MG PO T12A: EXTENDED_RELEASE_TABLET | ORAL | 0 refills | Status: DC

## 2016-12-20 NOTE — Telephone Encounter (Signed)
I was just calling up from my phone call this morning. I've normally heard something back from [place] by now, but I haven't heard anything. I told the pt that Marcy Siren is still waiting on a response from Dr. Jacqualyn Posey and once she hears something she will give her a call back. Pt stated he must be busy today which I did say he is but that as soon as she hears something Marcy Siren will call her back.

## 2016-12-20 NOTE — Telephone Encounter (Signed)
[place] the beach this [place] and I left my medication there and brought my lyrica medication home with me. I have an appointment this Wednesday with pain management but was wanting to know if Dr. Jacqualyn Posey could refill my pain medication.

## 2016-12-20 NOTE — Telephone Encounter (Signed)
She can only have a few pills to get her until Wednesday, no more

## 2016-12-20 NOTE — Telephone Encounter (Signed)
I informed pt of Dr. Leigh Aurora orders. Pt states there must be a note on the rx stating she can fill the oxycodone early. Dr. Jacqualyn Posey states okay to fill early and make the note.

## 2016-12-22 ENCOUNTER — Telehealth: Payer: Self-pay | Admitting: Podiatry

## 2016-12-22 NOTE — Telephone Encounter (Signed)
I'm calling because I'm experiencing severe pain on the bottom of my left foot. It is only when I'm walking. It started off in the heel of my foot but now has moved more toward the ball and outside part of my foot. It feels like somebody is slicing/stabbing my foot. If Dr. Jacqualyn Posey is in the office today I would like a call back. I told her that today is Dr. Leigh Aurora surgery day so she then asked if she could speak to Dr. Paulla Dolly since she started out seeing him. I explained that our system was down and Dr. Paulla Dolly was still with patient's. She asked if Dr. Paulla Dolly could call her back and if not she had his personal number and would call him at home this evening. I told the pt I would get the message to a nurse and we would try and get back in touch with her today.

## 2016-12-22 NOTE — Telephone Encounter (Signed)
Just tell her to come in the morning first thing at 8am

## 2016-12-23 ENCOUNTER — Encounter: Payer: Self-pay | Admitting: Podiatry

## 2016-12-23 ENCOUNTER — Ambulatory Visit (INDEPENDENT_AMBULATORY_CARE_PROVIDER_SITE_OTHER): Payer: BLUE CROSS/BLUE SHIELD | Admitting: Podiatry

## 2016-12-23 ENCOUNTER — Ambulatory Visit (INDEPENDENT_AMBULATORY_CARE_PROVIDER_SITE_OTHER): Payer: BLUE CROSS/BLUE SHIELD

## 2016-12-23 ENCOUNTER — Telehealth: Payer: Self-pay | Admitting: Podiatry

## 2016-12-23 ENCOUNTER — Ambulatory Visit: Payer: BLUE CROSS/BLUE SHIELD

## 2016-12-23 DIAGNOSIS — M79671 Pain in right foot: Secondary | ICD-10-CM

## 2016-12-23 DIAGNOSIS — G8929 Other chronic pain: Secondary | ICD-10-CM

## 2016-12-23 DIAGNOSIS — Z969 Presence of functional implant, unspecified: Secondary | ICD-10-CM

## 2016-12-23 DIAGNOSIS — M79673 Pain in unspecified foot: Secondary | ICD-10-CM

## 2016-12-23 NOTE — Telephone Encounter (Signed)
Hey Val. I just saw Dr. Jacqualyn Posey. I was calling to see if he could re-write my pain medication to last me until when I go to pain management because the guy wrote me for Vicodin but that doesn't work. If Dr. Jacqualyn Posey could write the Rx that he normally prescribes for my pain. Please call me back. Thank you.

## 2016-12-23 NOTE — Telephone Encounter (Signed)
I was calling to see if you got my message earlier. I told pt I had and that I forwarded the message to [facility] and that she is waiting on a response from Dr. Jacqualyn Posey. I told the pt that Dr. Jacqualyn Posey has been busy this afternoon but once he responds Marcy Siren will call her back. Pt stated okay.

## 2016-12-23 NOTE — Patient Instructions (Signed)

## 2016-12-23 NOTE — Telephone Encounter (Signed)
I will give her pain medication after surgery on Wednesday.

## 2016-12-23 NOTE — Telephone Encounter (Signed)
Dr. Jacqualyn Posey states pt is to continue the vicodin as ordered by pain management [facility] and take 800mg  Motrin 3 times daily and he will give her pain medication after surgery next Wednesday. Pt states she was given vicodin 10mg  every 4 hours.

## 2016-12-24 ENCOUNTER — Ambulatory Visit: Payer: BLUE CROSS/BLUE SHIELD | Admitting: Podiatry

## 2016-12-27 NOTE — Progress Notes (Signed)
Subjective: [name] presents the office today for concerns of acute left foot pain. She states that over the last day she started to sharp pain in the bottom of her foot. She denies any recent injury or trauma. She's been wearing regular shoes. She did get a pain management and she was given Vicodin for chronic pain. Denies any increase in swelling or redness to her feet. Denies any systemic complaints such as fevers, chills, nausea, vomiting. No acute changes since last appointment, and no other complaints at this time.   Objective: AAO x3, NAD DP/PT pulses palpable bilaterally, CRT less than 3 seconds Scars and the prior surgeries are well healed. There is mild diffuse chronic tenderness to palpation on the dorsal aspect of the feet bilaterally there is no specific area pinpoint bony tenderness. On the left foot specifically there is tenderness along the plantar aspect of the foot along the arch of the foot and this appears to be along the medial midfoot. She also seems to have some tenderness on the insertion upon her fascia within the arch of the foot. There is no increased edema, erythema, increase in warmth. There is no open lesion identified.  No open lesions or pre-ulcerative lesions.  No pain with calf compression, swelling, warmth, erythema  Assessment: Prominent hardware left foot; chronic bilateral foot pain  Plan: -All treatment options discussed with the patient including all alternatives, risks, complications.  -X-rays were obtained and reviewed the patient. There does appear to be migration of the K wire in the plantar aspect of the foot which is likely causing her symptoms. Screw was also broken which has been. Nonunion of arthrodesis site left foot. -We had a long discussion today regards to both conservative and surgical treatment options. At this point her symptoms mostly on the plantar as the foot and this is more coming from the prominent K wire. At this point I recommended  to remove this K wire. We had a discussion in regards to her feet in general. She states that she's had chronic pain to both of her feet at this point for greater than 10 years or longer. She states that her pain since the surgery is about the same. Unfortunately insert did not help with her symptoms but also do not worsen her symptoms. At this point I don't think that going back and revising the hole arthrodesis and taken all the hardware on the left foot will greatly improve her symptoms so therefore we decided to leave the hardware intact. Discussed with her she may need to have future surgery or something else to her feet however at this point were to start just removing the K wire. -Long-term discuss orthotics which have arrived however did not spend some today due to her upcoming surgery. -The incision placement as well as the postoperative course was discussed with the patient. I discussed risks of the surgery which include, but not limited to, infection, bleeding, pain, swelling, need for further surgery, delayed or nonhealing, painful or ugly scar, numbness or sensation changes, over/under correction, recurrence, transfer lesions, further deformity, hardware failure, DVT/PE, loss of toe/foot. Patient understands these risks and wishes to proceed with surgery. The surgical consent was reviewed with the patient all 3 pages were signed. No promises or guarantees were given to the outcome of the procedure. All questions were answered to the best of my ability. Before the surgery the patient was encouraged to call the office if there is any further questions. The surgery will be performed  at the [facility] on an outpatient basis.  Celesta Gentile, DPM

## 2016-12-29 ENCOUNTER — Encounter: Payer: Self-pay | Admitting: Podiatry

## 2016-12-29 ENCOUNTER — Telehealth: Payer: Self-pay | Admitting: Podiatry

## 2016-12-29 DIAGNOSIS — Z4889 Encounter for other specified surgical aftercare: Secondary | ICD-10-CM | POA: Diagnosis not present

## 2016-12-29 NOTE — Telephone Encounter (Signed)
This is Medical sales representative about Rx written by Dr. Jacqualyn Posey today. It was written for phenergan 25 mg with no quantity. Just want to know how many we should dispense?

## 2016-12-29 NOTE — Progress Notes (Signed)
Pre-operative Note  Patient presents to the [facility] today for surgical intervention of the left foot for painful retained hardware. The surgical consent was reviewed with the patient and we discussed the procedure as well as the postoperative course. I again discussed all alternatives, risks, complications. I answered all of their questions to the best of my ability and they wish to proceed with surgery. No promises or guarantees were given as to the outcome of the surgery.   I again discussed with her this is not a guarantee outcome and she may still need to have additional surgery. Although the rest of the hardware is present and there is a broken screw, at this time it is not causing her pain, except for the wire. Again, discussed that she has had the same pain for about 15 years. The surgeries that were done did not help, but again she states did not make the pain worse. I think she will benefit more from bracing/orthotics postop.   The surgical consent was signed.   Patient is NPO since midnight.  The patient does not have have a history of blood clots or bleeding disorders.   No further questions.   Celesta Gentile, [place]

## 2016-12-29 NOTE — Telephone Encounter (Signed)
LVM at pharmacy informing them the quantity on the prescription for phenergan 25mg  is #20 Q 8hrs prn nausea

## 2016-12-30 ENCOUNTER — Ambulatory Visit: Payer: BLUE CROSS/BLUE SHIELD

## 2016-12-30 ENCOUNTER — Ambulatory Visit: Payer: BLUE CROSS/BLUE SHIELD | Admitting: Podiatry

## 2016-12-30 DIAGNOSIS — M79673 Pain in unspecified foot: Principal | ICD-10-CM

## 2016-12-30 DIAGNOSIS — G8929 Other chronic pain: Secondary | ICD-10-CM

## 2016-12-30 NOTE — Telephone Encounter (Signed)
Pt called is saying she is still having pain even after having come in and seen our nurse Janett Billow Q this morning who did a bandage change on her. I offered her an appointment for 11 am tomorrow and she requested to be tentatively scheduled for that time but that she has an appointment at pain management in the morning at 8:30 and that if she is unable to come she will call us and let us know.

## 2016-12-31 ENCOUNTER — Telehealth: Payer: Self-pay | Admitting: *Deleted

## 2016-12-31 ENCOUNTER — Encounter: Payer: Self-pay | Admitting: Podiatry

## 2016-12-31 ENCOUNTER — Ambulatory Visit (INDEPENDENT_AMBULATORY_CARE_PROVIDER_SITE_OTHER): Payer: BLUE CROSS/BLUE SHIELD | Admitting: Podiatry

## 2016-12-31 DIAGNOSIS — Z969 Presence of functional implant, unspecified: Secondary | ICD-10-CM

## 2016-12-31 MED ORDER — OXYCODONE HCL 15 MG PO TABS
ORAL_TABLET | ORAL | 0 refills | Status: DC
Start: 2016-12-31 — End: 2017-10-31

## 2016-12-31 NOTE — Telephone Encounter (Signed)
"  I'm calling from the pharmacy.  I need to get a diagnosis code."  She had surgery on Wednesday for a pin/screw removal.  The diagnosis code is After Care Removal.  "Okay so she had another surgery?"  Yes, she did.

## 2017-01-02 NOTE — Progress Notes (Signed)
Subjective: [name] is a 56 y.o. is seen today in office s/p left foot HWR preformed on 12/29/2016.  Den she presents today for dressing changes she states that the bandage is still too tight. She states that otherwise she is doing well. She did go to pain management this point there will not read for pain medication until next Thursday as she did see another pain management physician prescribed Vicodin but she has not been taking. Denies any systemic complaints such as fevers, chills, nausea, vomiting. No calf pain, chest pain, shortness of breath.   Objective: General: No acute distress, AAOx3  DP/PT pulses palpable 2/4, CRT < 3 sec to all digits.  Protective sensation intact. Motor function intact.  LEFT foot: Incision is well coapted without any evidence of dehiscence and sutures are intact the 2 incisions on the plantar aspect of the foot . There is no surrounding erythema, ascending cellulitis, fluctuance, crepitus, malodor, drainage/purulence. There is minmal edema around the surgical site. There is no pain along the surgical site she states "it doesn't feel too bad".  No other areas of tenderness to bilateral lower extremities.  No other open lesions or pre-ulcerative lesions.  No pain with calf compression, swelling, warmth, erythema.       Assessment and Plan:  Status post left HWR, presents today for dressing change  -Treatment options discussed including all alternatives, risks, and complications -Dressing was changed. Antibiotic ointment was applied on the incision followed by dry sterile dressing. Keep dressing clean, dry, intact.  -Continue cam boot all times and limit weightbearing. She presents today weightbearing in the cam boot.  -Ice/elevation -Pain medication as needed. I did go ahead and write for her pain medication to help her get her till next Thursday when she sees pain management again. Once she sees pain management with defer all pain medicine to them. Today her  pain to the plantar aspect of the foot is much improved compared to what it was prior to surgery. I'm hopeful that we can attempt nonnarcotic measures in the future to help with her pain. She's had ongoing pain to her feet for 15 years and she states that his been a nagging throbbing aching pain for some time. Despite the surgery to her feet she still has the same pain and again she states is not worsened. Therefore I think she needs to have more long-term measures. Also spinal cord stimulator was discussed with her by her pain management physician and she is interested in doing this.  -Monitor for any clinical signs or symptoms of infection and DVT/PE and directed to call the office immediately should any occur or go to the ER. -Follow-up in 10 days for POSSIBLE suture removal or sooner if any problems arise. In the meantime, encouraged to call the office with any questions, concerns, change in symptoms.   Celesta Gentile, DPM

## 2017-01-03 ENCOUNTER — Encounter: Payer: BLUE CROSS/BLUE SHIELD | Admitting: Podiatry

## 2017-01-03 ENCOUNTER — Telehealth: Payer: Self-pay | Admitting: Podiatry

## 2017-01-03 NOTE — Telephone Encounter (Signed)
Keep dressing clean, dry, intact. Do not remove.

## 2017-01-03 NOTE — Telephone Encounter (Signed)
I was calling to see if I am supposed to be changing my bandages and also does he want me to use that thick cream like last time.

## 2017-01-04 NOTE — Telephone Encounter (Signed)
I informed pt of Dr. Leigh Aurora 01/03/2017 5:36pm orders.

## 2017-01-06 ENCOUNTER — Telehealth: Payer: Self-pay | Admitting: Podiatry

## 2017-01-06 NOTE — Telephone Encounter (Signed)
Please let [name] know that we have success and I have finally gotten in with pain management after trying to get in for about three months. I asked pt if she wanted Valery to call her back and she stated no.

## 2017-01-07 NOTE — Progress Notes (Signed)
Dressing changed, sterile dressing base left intact. Re-appointed at next appt time

## 2017-01-10 ENCOUNTER — Encounter: Payer: Self-pay | Admitting: Podiatry

## 2017-01-10 ENCOUNTER — Ambulatory Visit: Payer: BLUE CROSS/BLUE SHIELD | Admitting: Podiatry

## 2017-01-10 ENCOUNTER — Ambulatory Visit (INDEPENDENT_AMBULATORY_CARE_PROVIDER_SITE_OTHER): Payer: Self-pay | Admitting: Podiatry

## 2017-01-10 VITALS — BP 89/62 | HR 85 | Resp 18

## 2017-01-10 DIAGNOSIS — Z969 Presence of functional implant, unspecified: Secondary | ICD-10-CM

## 2017-01-12 NOTE — Progress Notes (Signed)
Subjective: [name] is a 56 y.o. is seen today in office s/p left foot HWR preformed on 01/05/2017. They state their pain is improved. Since In the boot she is having some pain to the outsideaspect of the left foot. She is also been putting more pain to the right foot says been swelling some but overall her splint as improved compared to what it has been. She has no other concerns today. No recent injury. Denies any systemic complaints such as fevers, chills, nausea, vomiting. No calf pain, chest pain, shortness of breath.   Objective: General: No acute distress, AAOx3  DP/PT pulses palpable 2/4, CRT < 3 sec to all digits.  Protective sensation intact. Motor function intact.  Left foot: Incision is well coapted without any evidence of dehiscence and sutures are intact . There is no surrounding erythema, ascending cellulitis, fluctuance, crepitus, malodor, drainage/purulence. There is trace edema around the surgical site. There is no pain along the surgical site. There is no pain to the dorsal medial aspect of the left midfoot and the only area of pain she has to the left foot is on the fifth metatarsal base at this point which is new since the surgery. I seen this is coming from the cam boot. She does still described and ache to the tops of both her feet. No significant swelling to the right foot.  No other areas of tenderness to bilateral lower extremities.  No other open lesions or pre-ulcerative lesions.  No pain with calf compression, swelling, warmth, erythema.   Assessment and Plan:  Status post left foot surgery, doing well with no complications   -Treatment options discussed including all alternatives, risks, and complications -Sutures removed the left foot today without complications. After removal the incision remained well coapted. I want her to remain in a CAM over the next couple of days. Once the incision is well healed she started transition to a regular shoe as  tolerated. -Antibiotic ointment and a bandage daily.  -Ice/elevation -Pain medication as needed.- she is seeing pain management! -Monitor for any clinical signs or symptoms of infection and DVT/PE and directed to call the office immediately should any occur or go to the ER. -Follow-up as scheduled or sooner if any problems arise. In the meantime, encouraged to call the office with any questions, concerns, change in symptoms.   Celesta Gentile, DPM

## 2017-01-19 NOTE — Progress Notes (Signed)
DOS 12/29/16 Removal of foreign body Lt foot

## 2017-01-25 ENCOUNTER — Telehealth: Payer: Self-pay | Admitting: Podiatry

## 2017-01-25 DIAGNOSIS — Z9889 Other specified postprocedural states: Secondary | ICD-10-CM

## 2017-01-25 NOTE — Telephone Encounter (Addendum)
Left message requesting a referral form from [place].01/26/2017-Faxed required form, clinicals and Demographics to [facility]. I informed pt of the Referral.

## 2017-01-25 NOTE — Telephone Encounter (Signed)
Pt requested a referral to a different pain management. States she does not like the doctor she see's at [facility]. States she has a Social worker that is helping her and that they recommended she see Dr. Brandy Hale. Dr. Brandy Hale is at [facility] and his phone number is [phone] and their fax number is [phone]. States the doctor at [facility] doesn't seem like he is going to do anything else for her.

## 2017-01-26 NOTE — Addendum Note (Signed)
Addended by: Harriett Sine D on: 01/26/2017 01:33 PM   Modules accepted: Orders

## 2017-01-27 ENCOUNTER — Ambulatory Visit: Payer: BLUE CROSS/BLUE SHIELD | Admitting: Podiatry

## 2017-01-28 ENCOUNTER — Encounter: Payer: Self-pay | Admitting: Podiatry

## 2017-01-28 ENCOUNTER — Ambulatory Visit (INDEPENDENT_AMBULATORY_CARE_PROVIDER_SITE_OTHER): Payer: Self-pay | Admitting: Podiatry

## 2017-01-28 VITALS — BP 101/75 | HR 80

## 2017-01-28 DIAGNOSIS — G8929 Other chronic pain: Secondary | ICD-10-CM

## 2017-01-28 DIAGNOSIS — M79673 Pain in unspecified foot: Secondary | ICD-10-CM

## 2017-01-28 DIAGNOSIS — Z969 Presence of functional implant, unspecified: Secondary | ICD-10-CM

## 2017-01-28 NOTE — Patient Instructions (Signed)

## 2017-01-31 NOTE — Progress Notes (Signed)
Subjective: [name] is a 56 y.o. is seen today in office s/p left foot HWR preformed on 01/05/2017. She states that she is doing well the incision to the bottom of the foot has healed. She has loose skin that she peeled off. She has been wearing a regular shoe and she presents today wearing a sketchers slip on shoe. She states that she feels the best on her feet. She states that overall she is doing well. She did follow-up with pain management and they did cut her medications out of 3 tablets a day. She states that she did not care for the pain management physician that she has seen so a referral was placed for new pain management physician. Discussed with her that we cannot shop around for the doctor who gave her what she wants. She is able to continue to work and wear regular shoe. She states that her foot feels better since removal of the wire. She states that "life is good and I have no complaints". Denies any systemic complaints such as fevers, chills, nausea, vomiting. No calf pain, chest pain, shortness of breath. She also states that she is, swelling down to her feet her legs. She has been taking diclofenac as well as her Lasix. She's been more consistent with her Lasix.  Objective: General: No acute distress, AAOx3  DP/PT pulses palpable 2/4, CRT < 3 sec to all digits.  Protective sensation intact. Motor function intact.  Left foot: Incision is well coapted without any evidence of dehiscence. Follow more lateral incision there is some evidence of where she peeled off some skin around the incision but there is no open sores identified. There is no drainage or pus there is no swelling erythema or ascending cellulitis. There is no fluctuance or crepitus is no malodor. There is no clinical signs of infection. No other areas of tenderness to bilateral lower extremities.  No other open lesions or pre-ulcerative lesions.  No pain with calf compression, swelling, warmth, erythema.   Assessment and  Plan:  Status post left foot surgery, doing well with no complications   -Treatment options discussed including all alternatives, risks, and complications -At this time the incisions appear to be healing well. Due to the area of skin that she peeled off recommended a small amount of antibiotic ointment dressing changes daily. -Orthotics were dispensed today. Oral and written break in instructions were discussed. -Follow-up with pain management. I will no longer be providing any narcotic pain medication for her. -Discussed change in shoe gear. -Overall she is doing better and she's having no complaints. I will see her back in 2 months or sooner if any issues are to arise. She agrees to this plan.   Celesta Gentile, DPM

## 2017-03-11 ENCOUNTER — Ambulatory Visit: Payer: BLUE CROSS/BLUE SHIELD | Admitting: Podiatry

## 2017-05-13 ENCOUNTER — Ambulatory Visit (INDEPENDENT_AMBULATORY_CARE_PROVIDER_SITE_OTHER): Payer: BLUE CROSS/BLUE SHIELD | Admitting: Podiatry

## 2017-05-13 DIAGNOSIS — M19072 Primary osteoarthritis, left ankle and foot: Secondary | ICD-10-CM

## 2017-05-13 DIAGNOSIS — G8929 Other chronic pain: Secondary | ICD-10-CM | POA: Diagnosis not present

## 2017-05-18 NOTE — Progress Notes (Signed)
Subjective: 57 year old female presents the office today for follow-up of bilateral foot pain.  She states that overall she is doing about the same.  She is very happy that she is been set up with a good pain management physician and they are working on getting her pain controlled.  She states that overall her feet are about the same as what they were prior to surgery but they have not worsened.  This is been ongoing issue for several years she states that she was on a period several years ago where she took pain medication and then she went without medicine.  She would like to continue with pain management.  She denies any recent injury or trauma. Denies any systemic complaints such as fevers, chills, nausea, vomiting. No acute changes since last appointment, and no other complaints at this time.   Objective: AAO x3, NAD DP/PT pulses palpable bilaterally, CRT less than 3 seconds Incisions from prior surgery are well-healed.  There is decreased medial arch height upon weightbearing.  Mild diffuse tenderness to bilateral midfoot.  There is trace edema there is no erythema or increase in warmth.  There is a palpable bony nodule to the dorsal medial aspect of the right foot which she states does cause discomfort inside shoes.  There is no other areas of tenderness identified. No open lesions or pre-ulcerative lesions.  No pain with calf compression, swelling, warmth, erythema  Assessment: Chronic bilateral foot pain  Plan: -All treatment options discussed with the patient including all alternatives, risks, complications.  -At this time we did discussion regards to treatment options.  Continue with pain management.  We discussed other treatment options.  She may benefit from a rocker-bottom shoe.  She does wear a very flexible flat shoe which helps with the knot on the top of her foot but is not giving her any support.  We discussed the change of shoes. -At this point I would like to hold off any further  surgical intervention but discussed heart removal in the future potentially. -Patient encouraged to call the office with any questions, concerns, change in symptoms.   Trula Slade DPM

## 2017-08-09 ENCOUNTER — Ambulatory Visit (INDEPENDENT_AMBULATORY_CARE_PROVIDER_SITE_OTHER): Payer: BLUE CROSS/BLUE SHIELD

## 2017-08-09 ENCOUNTER — Encounter: Payer: Self-pay | Admitting: Podiatry

## 2017-08-09 ENCOUNTER — Ambulatory Visit (INDEPENDENT_AMBULATORY_CARE_PROVIDER_SITE_OTHER): Payer: BLUE CROSS/BLUE SHIELD | Admitting: Podiatry

## 2017-08-09 DIAGNOSIS — M19072 Primary osteoarthritis, left ankle and foot: Secondary | ICD-10-CM | POA: Diagnosis not present

## 2017-08-09 DIAGNOSIS — M19071 Primary osteoarthritis, right ankle and foot: Secondary | ICD-10-CM

## 2017-08-09 DIAGNOSIS — G8929 Other chronic pain: Secondary | ICD-10-CM | POA: Diagnosis not present

## 2017-08-12 NOTE — Progress Notes (Signed)
Subjective: [name] presents the office today for concerns of bilateral foot pain.  She states that in general she has a lot of pain to her feet in the morning she first gets up and takes her some time to get going.  He is recently changed her arthritis medicine which may be helping some.  She states that she gets pain overall to her feet and not The Surgical Sites.  She States That She Does Not Think That Having Another Surgery to Her Feet at This Point Would Be Beneficial for Her but Should Discuss Possibly Other Options for Therapy.  She Is Still Going to Pain Management Which She States Is Helpful As Well.  She Denies Any Recent Injury or Trauma.  No Increase in Swelling or Any Redness to Her Feet Denies any systemic complaints such as fevers, chills, nausea, vomiting. No acute changes since last appointment, and no other complaints at this time.   Objective: AAO x3, NAD, very pleasant  DP/PT pulses palpable bilaterally, CRT less than 3 seconds Incisions from prior surgery are all well-healed.  There is diffuse mild tenderness mostly on the anterior aspect of the ankle and mildly along the surgical sites but there is no significant tenderness palpation on surgical sites.  Pulmonary evaluation for gait she does have significant pronation she is rolling inwards quite a bit and this is causing some ankle pain for her.  There is no area pinpoint bony tenderness or pain to vibratory sensation. No open lesions or pre-ulcerative lesions.  No pain with calf compression, swelling, warmth, erythema  Assessment: Bilateral chronic foot pain.  Plan: -All treatment options discussed with the patient including all alternatives, risks, complications.  -We did discussion regards to her foot pain.  This is been ongoing issue for her for greater than 10-15 years.  She was treated with narcotics at that point and then she would have intermittent periods of having the pain medicine.  At this point she feels that she  should continue with pain management as she is feeling much better overall she has been losing weight.  She actually looks much better today than she has previously as well.  I discussed with her wearing a more supportive shoe and even a steel plate in shoe to help offload the foot also discussed some more arch support for her shoes.  She is wearing a sketchers type of memory foam slip on today.  We also discussed CBD oil for her feet she is attempted numerous other treatments without any significant improvement.  Also does not have Liliane Channel evaluate her today to discuss possible shoe options for her.  If I have any other ideas for her ongoing recall. -Patient encouraged to call the office with any questions, concerns, change in symptoms.   Trula Slade DPM

## 2017-08-29 ENCOUNTER — Other Ambulatory Visit: Payer: BLUE CROSS/BLUE SHIELD | Admitting: Orthotics

## 2017-10-28 ENCOUNTER — Other Ambulatory Visit: Payer: Self-pay

## 2017-10-28 ENCOUNTER — Encounter (HOSPITAL_COMMUNITY): Payer: Self-pay | Admitting: *Deleted

## 2017-10-28 NOTE — Progress Notes (Signed)
Pt denies SOB, chest pain, and being under the care of a cardiologist. Pt stated that a stress test was performed " 20 years ago." Pt denies having a chest x ray and EKG within the last year. Pt denies recent labs. Pt made aware to stop taking Aspirin, vitamins, fish oil and herbal medications. Do not take any NSAIDs ie: Ibuprofen, Advil, Naproxen (Aleve), Motrin, BC and Goody Powder. Pt verbalized understanding of all pre-op instructions.

## 2017-10-28 NOTE — H&P (Signed)
PREOPERATIVE H&P  Chief Complaint: RIGHT DISTAL RADIUS FRACTURE  HPI: [name] is a 57 y.o. female who presents for preoperative history and physical with a diagnosis of RIGHT DISTAL RADIUS FRACTURE. Symptoms are rated as moderate to severe, and have been worsening.  This is significantly impairing activities of daily living.  Please see my clinic note for full details on this patient's care.  She has elected for surgical management.   Past Medical History:  Diagnosis Date  . Acute pericarditis    a. 04/2014 Cath: nl cors, EF 65-70%;  b. 04/2014 Echo: EF 65-70%, mild TR.  Marland Kitchen Allergy   . Anal fissure   . Anemia   . Anxiety   . Bipolar 1 disorder ([place])   . Chronic lower back pain   . Chronic pain   . Fibromyalgia   . GERD (gastroesophageal reflux disease)   . HLD (hyperlipidemia)   . Hypothyroidism   . RA (rheumatoid arthritis) ([place])    "all over"  . Substance abuse [facility])    Past Surgical History:  Procedure Laterality Date  . ABDOMINAL HYSTERECTOMY  03/2004  . [place]; 1999  . FOOT SURGERY Right 01/10/2016  . GASTRIC BYPASS  2001  . LEFT HEART CATHETERIZATION WITH CORONARY ANGIOGRAM N/A 04/23/2014   Normal coronaries and LVF  . SHOULDER ARTHROSCOPY W/ ROTATOR CUFF REPAIR Right ~ 2011  . TARSAL METATARSAL FUSION WITH WEIL OSTEOTOMY Left 2013  . TONSILLECTOMY    . TOTAL SHOULDER ARTHROPLASTY     Social History   Socioeconomic History  . Marital status: Divorced    Spouse name: Not on file  . Number of children: 2  . Years of education: Not on file  . Highest education level: Not on file  Occupational History  . 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 Financial resource strain: Not on file  . Food insecurity:    Worry: Not on file    Inability: Not on file  . Transportation needs:    Medical: Not on file    Non-medical: Not on file  Tobacco Use  . Smoking status: Current Every Day Smoker    Packs/day: 1.00    Years: 34.00    Pack years: 34.00   Types: Cigarettes  . Smokeless tobacco: Never Used  Substance and Sexual Activity  . Alcohol use: No  . Drug use: No  . Sexual activity: Not Currently  Lifestyle  . Physical activity:    Days per week: Not on file    Minutes per session: Not on file  . Stress: Not on file  Relationships  . Social connections:    Talks on phone: Not on file    Gets together: Not on file    Attends religious service: Not on file    Active member of club or organization: Not on file    Attends meetings of clubs or organizations: Not on file    Relationship status: Not on file  Other Topics Concern  . Not on file  Social History Narrative  . Not on file   Family History  Problem Relation Age of Onset  . Colon polyps Father   . Colon polyps Sister   . Heart attack Maternal Grandfather   . Colon polyps Paternal Grandmother   . Colon cancer Neg Hx    Allergies  Allergen Reactions  . Elavil [Amitriptyline]     Increases heart rate   Prior to Admission medications   Medication Sig Start Date End Date  Taking? Authorizing Provider  cephALEXin (KEFLEX) 500 MG capsule Take 1 capsule (500 mg total) by mouth 3 (three) times daily. Patient not taking: Reported on 09/15/2016 07/16/16   Wallene Huh, DPM  collagenase (SANTYL) ointment Apply 1 application topically daily. Patient not taking: Reported on 09/15/2016 08/04/16   Trula Slade, DPM  DULoxetine (CYMBALTA) 60 MG capsule Take 120 mg by mouth daily.    [provider]  enoxaparin (LOVENOX) 40 MG/0.4ML injection Inject 40 mg into the skin daily. 06/09/16   Trula Slade, DPM  folic acid (FOLVITE) 408 MCG tablet Take 400-2,000 mcg by mouth daily.    [provider]  furosemide (LASIX) 40 MG tablet Take 40 mg by mouth daily.    [provider]  levothyroxine (SYNTHROID, LEVOTHROID) 50 MCG tablet Take 50 mcg by mouth daily before breakfast.    [provider]  methocarbamol (ROBAXIN) 500 MG tablet Take 1  tablet (500 mg total) by mouth every 8 (eight) hours as needed for muscle spasms. 09/17/16   Trula Slade, DPM  methotrexate 1 G injection Inject into the muscle once a week. Pt injecting 1 ? weekly    [provider]  Multiple Vitamin (MULTIVITAMIN) tablet Take 1 tablet by mouth daily.    [provider]  mupirocin ointment (BACTROBAN) 2 % Apply 1 application topically 2 (two) times daily. Patient not taking: Reported on 09/15/2016 02/24/16   Trula Slade, DPM  NONFORMULARY OR COMPOUNDED ITEM [facility]:  Peripheral Neuropathy Cream - Bupivacaine 1%, Doxepin 3%, Gabapentin 6%, Pentoxifylline 3%, Topiramate 1%, apply 1-2 grams to affected area 3-4 times daily Patient not taking: Reported on 09/15/2016 06/28/16   Trula Slade, DPM  NONFORMULARY OR COMPOUNDED [place]: Achilles Tendonitis Cream - Diclofenac 3%, Baclofen 2%, Bupivacaine 1%, Ibuprofen 3%, Pentoxifylline 3%, apply 1-2 grams to affected area 3-4 times daily. Patient not taking: Reported on 09/15/2016 08/03/16   Trula Slade, DPM  omeprazole (PRILOSEC) 40 MG capsule Take 1 capsule (40 mg total) by mouth 2 (two) times daily. Open capsule and place in applesauce Patient not taking: Reported on 09/15/2016 12/10/15   Yetta Flock, MD  oxyCODONE (OXYCONTIN) 15 mg 12 hr tablet Take one tablet 4 times daily. 12/20/16   Trula Slade, DPM  oxyCODONE (ROXICODONE) 15 MG immediate release tablet Take one tablet 4 times daily prn pain. 11/02/16   Trula Slade, DPM  oxyCODONE (ROXICODONE) 15 MG immediate release tablet Take 1 tablet (15 mg total) by mouth every 6 (six) hours as needed for pain. 11/19/16   Wallene Huh, DPM  oxyCODONE (ROXICODONE) 15 MG immediate release tablet Take one tablet every 4 hours prn foot pain. 11/24/16   Trula Slade, DPM  oxyCODONE (ROXICODONE) 15 MG immediate release tablet Take one tablet 4 times daily. 11/29/16   Trula Slade, DPM  oxyCODONE  (ROXICODONE) 15 MG immediate release tablet Take one tablet 4 times daily 12/06/16   Trula Slade, DPM  oxyCODONE (ROXICODONE) 15 MG immediate release tablet Take one tablet 4 times a day. 12/15/16   Trula Slade, DPM  oxyCODONE (ROXICODONE) 15 MG immediate release tablet Take one tablet 4 times daily. 12/31/16   Trula Slade, DPM  Oxycodone HCl 20 MG TABS Take 1 tablet (20 mg total) by mouth 4 (four) times daily as needed. 09/20/16   Trula Slade, DPM  Oxycodone HCl 20 MG TABS Take one tablet every 4 hours prn foot  pain. 10/01/16   Trula Slade, DPM  Oxycodone HCl 20 MG TABS Take one tablet every 4 hours prn pain. 10/12/16   Trula Slade, DPM  Oxycodone HCl 20 MG TABS Take one tablet 4 times daily prn foot pain. 10/18/16   Trula Slade, DPM  oxyCODONE-acetaminophen (PERCOCET) 10-325 MG tablet Take 1 tablet by mouth every 4 (four) hours as needed for pain. Patient not taking: Reported on 09/15/2016 08/30/16   Trula Slade, DPM  oxyCODONE-acetaminophen (PERCOCET) 10-325 MG tablet Take 1 tablet by mouth every 4 (four) hours as needed for pain. Patient not taking: Reported on 09/15/2016 09/03/16   Trula Slade, DPM  oxyCODONE-acetaminophen (PERCOCET) 10-325 MG tablet Take 1 tablet by mouth every 4 (four) hours as needed for pain. 09/10/16   Trula Slade, DPM  pregabalin (LYRICA) 200 MG capsule Take 200 mg by mouth 2 (two) times daily.    [provider]  promethazine (PHENERGAN) 25 MG tablet Take 25 mg by mouth every 8 (eight) hours as needed for nausea or vomiting (take 1 tablet by mouth every eight hours as needed for nausea and vomiting).    [provider]  risperiDONE (RISPERDAL) 3 MG tablet Take 3 mg by mouth daily.    [provider]  sucralfate (CARAFATE) 1 GM/10ML suspension Take 10 mLs (1 g total) by mouth every 6 (six) hours as needed (every 6-8 hours as needed). 12/10/15   Armbruster, Carlota Raspberry, MD  traMADol (ULTRAM) 50 MG  tablet Take 1 tablet (50 mg total) by mouth every 8 (eight) hours as needed. 05/28/16   Trula Slade, DPM  traZODone (DESYREL) 100 MG tablet Take 200 mg by mouth at bedtime.    [provider]  Vitamin D, Cholecalciferol, 1000 UNITS TABS Take 2,000 Units by mouth daily.    [provider]     Positive ROS: All other systems have been reviewed and were otherwise negative with the exception of those mentioned in the HPI and as above.  Physical Exam: General: Alert, no acute distress Cardiovascular: No pedal edema Respiratory: No cyanosis, no use of accessory musculature GI: No organomegaly, abdomen is soft and non-tender Skin: No lesions in the area of chief complaint Neurologic: Sensation intact distally Psychiatric: Patient is competent for consent with normal mood and affect Lymphatic: No axillary or cervical lymphadenopathy  MUSCULOSKELETAL: R wrist: splint in place, distal motor and sensory function preserved, wwp hand.  Assessment: RIGHT DISTAL RADIUS FRACTURE  Plan: Plan for Procedure(s): OPEN REDUCTION INTERNAL FIXATION (ORIF) DISTAL RADIAL FRACTURE  The risks benefits and alternatives were discussed with the patient including but not limited to the risks of nonoperative treatment, versus surgical intervention including infection, bleeding, nerve injury,  blood clots, cardiopulmonary complications, morbidity, mortality, among others, and they were willing to proceed.   Hiram Gash, MD  10/28/2017 8:44 AM

## 2017-10-31 ENCOUNTER — Encounter (HOSPITAL_COMMUNITY): Payer: Self-pay

## 2017-10-31 ENCOUNTER — Encounter (HOSPITAL_COMMUNITY)
Admission: RE | Disposition: A | Discharge status: Home / Self Care | Payer: Self-pay | Source: Ambulatory Visit | Attending: Orthopaedic Surgery

## 2017-10-31 ENCOUNTER — Other Ambulatory Visit: Payer: Self-pay

## 2017-10-31 ENCOUNTER — Ambulatory Visit (HOSPITAL_COMMUNITY)
Admission: RE | Admit: 2017-10-31 | Discharge: 2017-10-31 | Disposition: A | Discharge status: Home / Self Care | Payer: BLUE CROSS/BLUE SHIELD | Source: Ambulatory Visit | Attending: Orthopaedic Surgery | Admitting: Orthopaedic Surgery

## 2017-10-31 ENCOUNTER — Ambulatory Visit (HOSPITAL_COMMUNITY): Payer: BLUE CROSS/BLUE SHIELD | Admitting: Anesthesiology

## 2017-10-31 DIAGNOSIS — M797 Fibromyalgia: Secondary | ICD-10-CM | POA: Diagnosis not present

## 2017-10-31 DIAGNOSIS — S52571A Other intraarticular fracture of lower end of right radius, initial encounter for closed fracture: Secondary | ICD-10-CM | POA: Insufficient documentation

## 2017-10-31 DIAGNOSIS — Z6839 Body mass index (BMI) 39.0-39.9, adult: Secondary | ICD-10-CM | POA: Insufficient documentation

## 2017-10-31 DIAGNOSIS — F17210 Nicotine dependence, cigarettes, uncomplicated: Secondary | ICD-10-CM | POA: Diagnosis not present

## 2017-10-31 DIAGNOSIS — Z79890 Hormone replacement therapy (postmenopausal): Secondary | ICD-10-CM | POA: Diagnosis not present

## 2017-10-31 DIAGNOSIS — Z9884 Bariatric surgery status: Secondary | ICD-10-CM | POA: Diagnosis not present

## 2017-10-31 DIAGNOSIS — M545 Low back pain: Secondary | ICD-10-CM | POA: Insufficient documentation

## 2017-10-31 DIAGNOSIS — Z79891 Long term (current) use of opiate analgesic: Secondary | ICD-10-CM | POA: Insufficient documentation

## 2017-10-31 DIAGNOSIS — M069 Rheumatoid arthritis, unspecified: Secondary | ICD-10-CM | POA: Diagnosis not present

## 2017-10-31 DIAGNOSIS — F319 Bipolar disorder, unspecified: Secondary | ICD-10-CM | POA: Insufficient documentation

## 2017-10-31 DIAGNOSIS — X58XXXA Exposure to other specified factors, initial encounter: Secondary | ICD-10-CM | POA: Diagnosis not present

## 2017-10-31 DIAGNOSIS — Z96619 Presence of unspecified artificial shoulder joint: Secondary | ICD-10-CM | POA: Diagnosis not present

## 2017-10-31 DIAGNOSIS — G8929 Other chronic pain: Secondary | ICD-10-CM | POA: Insufficient documentation

## 2017-10-31 DIAGNOSIS — E039 Hypothyroidism, unspecified: Secondary | ICD-10-CM | POA: Insufficient documentation

## 2017-10-31 DIAGNOSIS — Z79899 Other long term (current) drug therapy: Secondary | ICD-10-CM | POA: Diagnosis not present

## 2017-10-31 DIAGNOSIS — F419 Anxiety disorder, unspecified: Secondary | ICD-10-CM | POA: Insufficient documentation

## 2017-10-31 HISTORY — PX: OPEN REDUCTION INTERNAL FIXATION (ORIF) DISTAL RADIAL FRACTURE: SHX5989

## 2017-10-31 HISTORY — DX: Fracture of unspecified carpal bone, unspecified wrist, initial encounter for closed fracture: S62.109A

## 2017-10-31 LAB — CBC
HCT: 37.7 % (ref 36.0–46.0)
Hemoglobin: 12.2 g/dL (ref 12.0–15.0)
MCH: 31 pg (ref 26.0–34.0)
MCHC: 32.4 g/dL (ref 30.0–36.0)
MCV: 95.7 fL (ref 78.0–100.0)
PLATELETS: 288 10*3/uL (ref 150–400)
RBC: 3.94 MIL/uL (ref 3.87–5.11)
RDW: 13.2 % (ref 11.5–15.5)
WBC: 6.2 10*3/uL (ref 4.0–10.5)

## 2017-10-31 SURGERY — OPEN REDUCTION INTERNAL FIXATION (ORIF) DISTAL RADIUS FRACTURE
Anesthesia: Regional | Site: Arm Lower | Laterality: Right

## 2017-10-31 MED ORDER — BUPIVACAINE-EPINEPHRINE (PF) 0.5% -1:200000 IJ SOLN
INTRAMUSCULAR | Status: AC
  Filled 2017-10-31: qty 30

## 2017-10-31 MED ORDER — VANCOMYCIN HCL 1000 MG IV SOLR
INTRAVENOUS | Status: AC
  Filled 2017-10-31: qty 1000

## 2017-10-31 MED ORDER — HYDROMORPHONE HCL 2 MG/ML IJ SOLN: 0.2500 mg | INTRAMUSCULAR | Status: DC | PRN

## 2017-10-31 MED ORDER — OXYCODONE HCL 5 MG PO TABS
ORAL_TABLET | ORAL | 0 refills | Status: AC
Start: 2017-10-31 — End: 2017-11-05

## 2017-10-31 MED ORDER — ONDANSETRON HCL 4 MG/2ML IJ SOLN
INTRAMUSCULAR | Status: DC | PRN
  Administered 2017-10-31: 4 mg via INTRAVENOUS

## 2017-10-31 MED ORDER — PROPOFOL 10 MG/ML IV BOLUS
INTRAVENOUS | Status: AC
  Filled 2017-10-31: qty 20

## 2017-10-31 MED ORDER — PROPOFOL 10 MG/ML IV BOLUS
INTRAVENOUS | Status: DC | PRN
  Administered 2017-10-31: 160 mg via INTRAVENOUS

## 2017-10-31 MED ORDER — LIDOCAINE 2% (20 MG/ML) 5 ML SYRINGE
INTRAMUSCULAR | Status: DC | PRN
  Administered 2017-10-31: 80 mg via INTRAVENOUS

## 2017-10-31 MED ORDER — PHENYLEPHRINE HCL 10 MG/ML IJ SOLN
INTRAMUSCULAR | Status: DC | PRN
  Administered 2017-10-31: 80 ug via INTRAVENOUS
  Administered 2017-10-31 (×2): 120 ug via INTRAVENOUS

## 2017-10-31 MED ORDER — BUPIVACAINE-EPINEPHRINE (PF) 0.25% -1:200000 IJ SOLN
INTRAMUSCULAR | Status: AC
  Filled 2017-10-31: qty 30

## 2017-10-31 MED ORDER — CHLORHEXIDINE GLUCONATE 4 % EX LIQD: 60.0000 mL | Freq: Once | CUTANEOUS | Status: DC

## 2017-10-31 MED ORDER — OXYCODONE HCL 5 MG/5ML PO SOLN: 5.0000 mg | Freq: Once | ORAL | Status: DC | PRN

## 2017-10-31 MED ORDER — MELOXICAM 7.5 MG PO TABS: 7.5000 mg | ORAL_TABLET | Freq: Every day | ORAL | 2 refills | Status: AC

## 2017-10-31 MED ORDER — MIDAZOLAM HCL 2 MG/2ML IJ SOLN
INTRAMUSCULAR | Status: AC
  Filled 2017-10-31: qty 2

## 2017-10-31 MED ORDER — 0.9 % SODIUM CHLORIDE (POUR BTL) OPTIME
TOPICAL | Status: DC | PRN
  Administered 2017-10-31: 1000 mL

## 2017-10-31 MED ORDER — FENTANYL CITRATE (PF) 100 MCG/2ML IJ SOLN
INTRAMUSCULAR | Status: AC
  Administered 2017-10-31: 100 ug
  Filled 2017-10-31: qty 2

## 2017-10-31 MED ORDER — ACETAMINOPHEN 500 MG PO TABS: 1000.0000 mg | ORAL_TABLET | Freq: Three times a day (TID) | ORAL | 0 refills | Status: AC

## 2017-10-31 MED ORDER — MIDAZOLAM HCL 2 MG/2ML IJ SOLN
INTRAMUSCULAR | Status: AC
  Administered 2017-10-31: 2 mg
  Filled 2017-10-31: qty 2

## 2017-10-31 MED ORDER — DEXAMETHASONE SODIUM PHOSPHATE 10 MG/ML IJ SOLN
INTRAMUSCULAR | Status: DC | PRN
  Administered 2017-10-31: 10 mg via INTRAVENOUS

## 2017-10-31 MED ORDER — VANCOMYCIN HCL 1000 MG IV SOLR
INTRAVENOUS | Status: DC | PRN
  Administered 2017-10-31: 1000 mg

## 2017-10-31 MED ORDER — ONDANSETRON HCL 4 MG/2ML IJ SOLN: 4.0000 mg | Freq: Four times a day (QID) | INTRAMUSCULAR | Status: DC | PRN

## 2017-10-31 MED ORDER — OXYCODONE HCL 5 MG PO TABS: 5.0000 mg | ORAL_TABLET | Freq: Once | ORAL | Status: DC | PRN

## 2017-10-31 MED ORDER — ONDANSETRON HCL 4 MG PO TABS: 4.0000 mg | ORAL_TABLET | Freq: Three times a day (TID) | ORAL | 1 refills | Status: AC | PRN

## 2017-10-31 MED ORDER — LACTATED RINGERS IV SOLN
INTRAVENOUS | Status: DC | PRN
  Administered 2017-10-31: 14:00:00 via INTRAVENOUS

## 2017-10-31 MED ORDER — FENTANYL CITRATE (PF) 250 MCG/5ML IJ SOLN
INTRAMUSCULAR | Status: AC
  Filled 2017-10-31: qty 5

## 2017-10-31 MED ORDER — CEFAZOLIN SODIUM-DEXTROSE 2-4 GM/100ML-% IV SOLN
2.0000 g | INTRAVENOUS | Status: AC
  Administered 2017-10-31: 2 g via INTRAVENOUS

## 2017-10-31 SURGICAL SUPPLY — 59 items
ALCOHOL 70% 16 OZ (MISCELLANEOUS) IMPLANT
BANDAGE ACE 4X5 VEL STRL LF (GAUZE/BANDAGES/DRESSINGS) ×3 IMPLANT
BANDAGE ELASTIC 4 VELCRO ST LF (GAUZE/BANDAGES/DRESSINGS) ×3 IMPLANT
BIT DRILL 2.2 SS TIBIAL (BIT) ×3 IMPLANT
BLADE CLIPPER SURG (BLADE) IMPLANT
BNDG ESMARK 4X9 LF (GAUZE/BANDAGES/DRESSINGS) ×3 IMPLANT
CANISTER SUCTION WELLS/JOHNSON (MISCELLANEOUS) ×3 IMPLANT
CORD BIPOLAR FORCEPS 12FT (ELECTRODE) ×3 IMPLANT
COVER SURGICAL LIGHT HANDLE (MISCELLANEOUS) ×3 IMPLANT
CUFF TOURNIQUET SINGLE 18IN (TOURNIQUET CUFF) ×3 IMPLANT
CUFF TOURNIQUET SINGLE 24IN (TOURNIQUET CUFF) IMPLANT
DECANTER SPIKE VIAL GLASS SM (MISCELLANEOUS) IMPLANT
DRAPE OEC MINIVIEW 54X84 (DRAPES) ×3 IMPLANT
DRAPE SURG 17X23 STRL (DRAPES) ×3 IMPLANT
ELECT REM PT RETURN 9FT ADLT (ELECTROSURGICAL) ×3
ELECTRODE REM PT RTRN 9FT ADLT (ELECTROSURGICAL) ×1 IMPLANT
GAUZE SPONGE 4X4 12PLY STRL (GAUZE/BANDAGES/DRESSINGS) ×3 IMPLANT
GAUZE SPONGE 4X4 12PLY STRL LF (GAUZE/BANDAGES/DRESSINGS) ×3 IMPLANT
GAUZE XEROFORM 1X8 LF (GAUZE/BANDAGES/DRESSINGS) ×3 IMPLANT
GLOVE BIOGEL PI IND STRL 8 (GLOVE) ×1 IMPLANT
GLOVE BIOGEL PI INDICATOR 8 (GLOVE) ×2
GLOVE ECLIPSE 8.0 STRL XLNG CF (GLOVE) ×6 IMPLANT
GOWN STRL REUS W/ TWL LRG LVL3 (GOWN DISPOSABLE) ×2 IMPLANT
GOWN STRL REUS W/ TWL XL LVL3 (GOWN DISPOSABLE) ×2 IMPLANT
GOWN STRL REUS W/TWL LRG LVL3 (GOWN DISPOSABLE) ×4
GOWN STRL REUS W/TWL XL LVL3 (GOWN DISPOSABLE) ×4
K-WIRE 1.6 (WIRE) ×2
K-WIRE FX5X1.6XNS BN SS (WIRE) ×1
KIT BASIN OR (CUSTOM PROCEDURE TRAY) ×3 IMPLANT
KIT TURNOVER KIT B (KITS) ×3 IMPLANT
KWIRE FX5X1.6XNS BN SS (WIRE) ×1 IMPLANT
NEEDLE HYPO 25GX1X1/2 BEV (NEEDLE) IMPLANT
NS IRRIG 1000ML POUR BTL (IV SOLUTION) ×3 IMPLANT
PACK ORTHO EXTREMITY (CUSTOM PROCEDURE TRAY) ×3 IMPLANT
PAD ARMBOARD 7.5X6 YLW CONV (MISCELLANEOUS) ×6 IMPLANT
PAD CAST 4YDX4 CTTN HI CHSV (CAST SUPPLIES) ×1 IMPLANT
PADDING CAST COTTON 4X4 STRL (CAST SUPPLIES) ×2
PEG LOCKING SMOOTH 2.2X18 (Peg) ×15 IMPLANT
PLATE NARROW DVR RIGHT (Plate) ×3 IMPLANT
SCREW LOCK 14X2.7X 3 LD TPR (Screw) ×2 IMPLANT
SCREW LOCK 16X2.7X 3 LD TPR (Screw) ×1 IMPLANT
SCREW LOCKING 2.7X14 (Screw) ×4 IMPLANT
SCREW LOCKING 2.7X15MM (Screw) ×3 IMPLANT
SCREW LOCKING 2.7X16 (Screw) ×2 IMPLANT
SCREW LP NL 2.7X22MM (Screw) ×3 IMPLANT
SCREW NONLOCK 2.7X18MM (Screw) ×3 IMPLANT
SOAP 2 % CHG 4 OZ (WOUND CARE) ×3 IMPLANT
SPLINT PLASTER EXTRA FAST 3X15 (CAST SUPPLIES) ×2
SPLINT PLASTER GYPS XFAST 3X15 (CAST SUPPLIES) ×1 IMPLANT
SPONGE LAP 4X18 RFD (DISPOSABLE) ×3 IMPLANT
SUT MNCRL AB 3-0 PS2 18 (SUTURE) ×3 IMPLANT
SUT PROLENE 3 0 PS 2 (SUTURE) ×3 IMPLANT
SYR CONTROL 10ML LL (SYRINGE) IMPLANT
TAPE STRIPS DRAPE STRL (GAUZE/BANDAGES/DRESSINGS) ×3 IMPLANT
TRAY URETHRAL FOLEY CATH 14FR (CATHETERS) ×3 IMPLANT
TUBE CONNECTING 12'X1/4 (SUCTIONS) ×1
TUBE CONNECTING 12X1/4 (SUCTIONS) ×2 IMPLANT
WATER STERILE IRR 1000ML POUR (IV SOLUTION) IMPLANT
YANKAUER SUCT BULB TIP NO VENT (SUCTIONS) IMPLANT

## 2017-10-31 NOTE — Op Note (Signed)
Orthopaedic Surgery Operative Note (CSN: [id number])  [name]  1960-08-15 Date of Surgery: 10/31/2017   Diagnoses:  RIGHT DISTAL RADIUS FRACTURE  Procedure: Intraarticular R distal radius fracture >3 pieces ORIF - CPT  25609   Operative Finding Successful completion of planned procedure.  Good fixation, no issues.  Post-operative plan: The patient will be nwb in demi splint.  The patient will be dc home.  DVT prophylaxis not indicated in upper extremity patient.  Pain control with PRN pain medication preferring oral medicines the patient has a pain contract and we are adding our oxycodone to her baseline medicines.  Follow up plan will be scheduled in approximately 7 days for incision check and XR.  Post-Op Diagnosis: Same Surgeons:Primary: Hiram Gash, MD Assistants: Joya Gaskins, OPAC Location: [facility] OR ROOM 10 Anesthesia: General Antibiotics: Ancef 2g preop, Vancomycin 1000 mg locally  Tourniquet time: 45 minutes Estimated Blood Loss: Minimal Complications: None Specimens: None Implants: Implant Name Type Inv. Item Serial No. Manufacturer Lot No. LRB No. Used Action  PLATE NARROW DVR RIGHT - [id number] Plate PLATE NARROW DVR RIGHT  [employer]IM[employer](ORTH,TRAU,BIO,SG)  Right 1 Implanted  PEG LOCKING SMOOTH 2.2X18 - [id number] Peg PEG LOCKING SMOOTH 2.2X18  [employer]IMMER [employer]ON(ORTH,TRAU,BIO,SG)  Right 5 Implanted    Indications for Surgery:   [name] is a 57 y.o. female with fall resulting in intra-articular significantly dorsally displaced and angulated distal radius fracture.  Patient has a history of chronic pain managed by pain clinic and we obtained their permission to write additional pain medications in the postop setting.  Benefits and risks of operative and nonoperative management were discussed prior to surgery with patient/guardian(s) and informed consent form was completed.  Specific risks including infection, need for additional surgery, hardware prominence or pain, stiffness,  need for therapy, periprosthetic fracture.   Procedure:   The patient was identified in the preoperative holding area where the surgical site was marked. The patient was taken to the OR where a procedural timeout was called and the above noted anesthesia was induced.  The patient was positioned supine on hand table.  Preoperative antibiotics were dosed.  The patient's right wrist was prepped and draped in the usual sterile fashion.  A second preoperative timeout was called.      A tourniquet was used for the above listed time.   An FCR approach was made exposing the volar surface of the distal radius taking care to go through the sheath of the FCR tendon tract and ulnarly exposing the inferior portion of the sheath while protecting the median nerve and radial artery on each side with blunt retractors.  This inferior portion of the sheath was incised sharply and examined for presence of the palmar cutaneous branch of the median nerve.  It was determined to not be within the field and we carried our dissection deeply to the bone splitting the pronator quadratus and exposing the fracture site.    Appropriate reduction was obtained and a narrow right DVR Biomet plate was placed and checked for sizing and reduction under fluoroscopy.  This reduction was held in place with K wires and a K wire was placed into the radial styloid.    Once appropriate reduction was confirmed we then proceeded to fix the plate proximally and then proceeded to fill the distal holes with a combination of partially threaded screws and pegs.  At this point we checked our reduction to ensure that there was no intra-articular extension of our screws.  Once this was  confirmed we proceeded to fill remaining 3 proximal shaft screws and obtained final images which demonstrated appropriate reduction and maintenance of alignment.  We did use 2 locking screws as the purchase was relatively poor with her first nonlocking screw and this was  switched.  We then placed a nonlocking screw in the slot and had good purchase.  The DRUJ was checked and found to be stable.  We verified that all fast guides were removed on XR and through count.    The wound was thoroughly irrigated.  The tourniquet was released prior to skin closure to verify there was no excessive bleeding and we visualized that the radial artery and median nerve were intact at the end of the case. The PQ was reapproximated grossly prior to skin closure.     The incision was thoroughly irrigated and closed in a multilayer fashion with absorbable sutures-. A sterile dressing was placed.  Vancomycin powder was placed prior to the wound closure.  the patient was awoken from general anesthesia and taken to the PACU in stable condition without complication.    Joya Gaskins, OPA-C, present and scrubbed throughout the case, critical for completion in a timely fashion, and for retraction, instrumentation, closure.

## 2017-10-31 NOTE — Transfer of Care (Signed)
Immediate Anesthesia Transfer of Care Note  Patient: [name]  Procedure(s) Performed: OPEN REDUCTION INTERNAL FIXATION (ORIF) DISTAL RADIAL FRACTURE (Right Arm Lower)  Patient Location: PACU  Anesthesia Type:General  Level of Consciousness: awake, alert  and oriented  Airway & Oxygen Therapy: Patient Spontanous Breathing and Patient connected to nasal cannula oxygen  Post-op Assessment: Report given to RN and Post -op Vital signs reviewed and stable  Post vital signs: Reviewed and stable  Last Vitals:  Vitals Value Taken Time  BP 123/81 10/31/2017  3:45 PM  Temp    Pulse 67 10/31/2017  3:51 PM  Resp 16 10/31/2017  3:51 PM  SpO2 100 % 10/31/2017  3:51 PM  Vitals shown include unvalidated device data.  Last Pain:  Vitals:   10/31/17 1048  TempSrc:   PainSc: 6       Patients Stated Pain Goal: 3 (61/53/79 4327)  Complications: No apparent anesthesia complications

## 2017-10-31 NOTE — Anesthesia Procedure Notes (Signed)
Anesthesia Regional Block: Supraclavicular block   Pre-Anesthetic Checklist: ,, timeout performed, Correct Patient, Correct Site, Correct Laterality, Correct Procedure, Correct Position, site marked, Risks and benefits discussed,  Surgical consent,  Pre-op evaluation,  At surgeon's request and post-op pain management  Laterality: Right  Prep: chloraprep       Needles:  Injection technique: Single-shot  Needle Type: Echogenic Stimulator Needle     Needle Length: 5cm  Needle Gauge: 22     Additional Needles:   Procedures:, nerve stimulator,,,,,,,   Nerve Stimulator or Paresthesia:  Response: biceps flexion, 0.45 mA,   Additional Responses:   Narrative:  Start time: 10/31/2017 11:44 AM End time: 10/31/2017 11:52 AM Injection made incrementally with aspirations every 5 mL.  Performed by: Personally  Anesthesiologist: Albertha Ghee, MD  Additional Notes: Functioning IV was confirmed and monitors were applied.  A 56mm 22ga Arrow echogenic stimulator needle was used. Sterile prep and drape,hand hygiene and sterile gloves were used.  Negative aspiration and negative test dose prior to incremental administration of local anesthetic. The patient tolerated the procedure well.  Ultrasound guidance: relevent anatomy identified, needle position confirmed, local anesthetic spread visualized around nerve(s), vascular puncture avoided.  Image printed for medical record.

## 2017-10-31 NOTE — Anesthesia Preprocedure Evaluation (Addendum)
Anesthesia Evaluation  Patient identified by MRN, date of birth, ID band Patient awake    Reviewed: Allergy & Precautions, H&P , NPO status , Patient's Chart, lab work & pertinent test results  Airway Mallampati: II   Neck ROM: full    Dental  (+) Teeth Intact, Dental Advisory Given   Pulmonary Current Smoker,    breath sounds clear to auscultation       Cardiovascular negative cardio ROS   Rhythm:regular Rate:Normal     Neuro/Psych PSYCHIATRIC DISORDERS Anxiety Bipolar Disorder  Neuromuscular disease    GI/Hepatic GERD  ,  Endo/Other  Hypothyroidism Morbid obesity  Renal/GU      Musculoskeletal  (+) Arthritis , Fibromyalgia -  Abdominal   Peds  Hematology  (+) anemia ,   Anesthesia Other Findings   Reproductive/Obstetrics                            Anesthesia Physical Anesthesia Plan  ASA: II  Anesthesia Plan: General and Regional   Post-op Pain Management:  Regional for Post-op pain   Induction: Intravenous  PONV Risk Score and Plan: 2 and Ondansetron, Dexamethasone, Midazolam and Treatment may vary due to age or medical condition  Airway Management Planned: LMA  Additional Equipment:   Intra-op Plan:   Post-operative Plan:   Informed Consent: I have reviewed the patients History and Physical, chart, labs and discussed the procedure including the risks, benefits and alternatives for the proposed anesthesia with the patient or authorized representative who has indicated his/her understanding and acceptance.     Plan Discussed with: CRNA, Anesthesiologist and Surgeon  Anesthesia Plan Comments:         Anesthesia Quick Evaluation

## 2017-10-31 NOTE — Interval H&P Note (Signed)
Discussed case, risks and benefits with patient again.  All questions answered, no change to history.  Again discussed pain clinic plan.  Will write my standard medicines she will take in addition to her standard medicines.  Ophelia Charter MD

## 2017-10-31 NOTE — Anesthesia Procedure Notes (Signed)
Procedure Name: LMA Insertion Date/Time: 10/31/2017 2:31 PM Performed by: Bryson Corona, CRNA Pre-anesthesia Checklist: Patient identified, Emergency Drugs available, Suction available and Patient being monitored Patient Re-evaluated:Patient Re-evaluated prior to induction Oxygen Delivery Method: Circle System Utilized Preoxygenation: Pre-oxygenation with 100% oxygen Induction Type: IV induction Ventilation: Mask ventilation without difficulty LMA: LMA inserted LMA Size: 4.0 Number of attempts: 1 Placement Confirmation: positive ETCO2 Tube secured with: Tape Dental Injury: Teeth and Oropharynx as per pre-operative assessment

## 2017-11-01 ENCOUNTER — Encounter (HOSPITAL_COMMUNITY): Payer: Self-pay | Admitting: Orthopaedic Surgery

## 2017-11-01 NOTE — Anesthesia Postprocedure Evaluation (Signed)
Anesthesia Post Note  Patient: Arielis Leonhart  Procedure(s) Performed: OPEN REDUCTION INTERNAL FIXATION (ORIF) DISTAL RADIAL FRACTURE (Right Arm Lower)     Patient location during evaluation: PACU Anesthesia Type: Regional and General Level of consciousness: awake and alert Pain management: pain level controlled Vital Signs Assessment: post-procedure vital signs reviewed and stable Respiratory status: spontaneous breathing, nonlabored ventilation, respiratory function stable and patient connected to nasal cannula oxygen Cardiovascular status: blood pressure returned to baseline and stable Postop Assessment: no apparent nausea or vomiting Anesthetic complications: no    Last Vitals:  Vitals:   10/31/17 1615 10/31/17 1648  BP: 109/70 120/77  Pulse: 70 (!) 58  Resp: (!) 22 14  Temp: 36.5 C 36.5 C  SpO2: 100% 93%    Last Pain:  Vitals:   10/31/17 1623  TempSrc:   PainSc: 0-No pain                 Dakoda Laventure S

## 2017-12-15 ENCOUNTER — Encounter: Payer: Self-pay | Admitting: Podiatry

## 2017-12-15 ENCOUNTER — Ambulatory Visit (INDEPENDENT_AMBULATORY_CARE_PROVIDER_SITE_OTHER): Payer: BLUE CROSS/BLUE SHIELD | Admitting: Podiatry

## 2017-12-15 DIAGNOSIS — M19071 Primary osteoarthritis, right ankle and foot: Secondary | ICD-10-CM | POA: Diagnosis not present

## 2017-12-15 DIAGNOSIS — M19072 Primary osteoarthritis, left ankle and foot: Secondary | ICD-10-CM

## 2017-12-15 DIAGNOSIS — G8929 Other chronic pain: Secondary | ICD-10-CM

## 2017-12-19 ENCOUNTER — Telehealth: Payer: Self-pay | Admitting: Podiatry

## 2017-12-19 NOTE — Telephone Encounter (Signed)
Pt stated that she wanted to tell Dr. Jacqualyn Posey she wanted to be on FMLA instead of disability. She wanted to know if she can be out 8-9 weeks. Please give her a call

## 2017-12-20 NOTE — Progress Notes (Signed)
Subjective: 8 female presents the office today for evaluation of bilateral foot pain.  She states that she has just generalized pain to both of her feet there is not one specific area that hurts.  Her left side seems to be more painful than her right side.  She states that she has pain on a daily basis.  She is going to pain management for her feet as well as other issues.  She has multiple other comorbidities including bipolar disorder, back issues and a recent right wrist injury, fibromyalgia.  She states that she has been very tired recently with medications.  Her primary care physician manages her bipolar medications and she also goes to pain management.  She denies any recent injury or trauma to her feet but she has pain on a regular basis.  She states that the issues that she underwent in the most comfortable having a flexible, flat shoes.  She is having difficulty working because of her foot pain.  Denies any systemic complaints such as fevers, chills, nausea, vomiting. No acute changes since last appointment, and no other complaints at this time.   Objective: AAO x3, NAD DP/PT pulses palpable bilaterally, CRT less than 3 seconds Incision from prior surgery are all well-healed.  There is mild diffuse tenderness to bilateral feet and subjectively the left side is worse than the right.  There is no specific area pinpoint tenderness.  Upon gait evaluation her left foot appears to be more rectus in her right foot is definitely more pronated.  She is wearing very flexible, flat shoes and she states these are most comfortable and she cannot wear shoe with support.  This is been ongoing issues with her since prior to surgery.  There is no pain to the ankle.  Tendons appear to be intact. No open lesions or pre-ulcerative lesions.  No pain with calf compression, swelling, warmth, erythema  Assessment: Chronic bilateral foot pain  Plan: -All treatment options discussed with the patient including all  alternatives, risks, complications.  -We long discussion today regarding options.  I do not think another surgery to be beneficial for her at this point it is always an option we discussed medial column fusion.  I reviewed the old x-rays with her thought may be a medial column fusion on the right side would be helpful but does not seem that she has pain to that area.  Because never been on any further surgery but I do continue to recommend wearing supportive shoes.  We can also consider custom brace in the future if needed but she was to hold off on that.  She does have pain with working.  She has issues because she could not stand for long period of time because of her feet that she sits but given her other medical issues she has difficulty sitting for long period of time.  She is contemplating disability.  I think this would be reasonable for her given all of her medical conditions.  She is to consider her options.  Also discussed with her possibly getting a psychiatrist to help manage her medications and her mental illness.  It seems that with all the pain that she has in her body this seems to aggravate her bipolar. -Patient encouraged to call the office with any questions, concerns, change in symptoms.   I spent 20 minutes in face-to-face time discussing this with her.   Trula Slade DPM

## 2017-12-20 NOTE — Telephone Encounter (Signed)
Ok. Please sent to [facility].

## 2018-01-23 ENCOUNTER — Encounter: Payer: Self-pay | Admitting: Podiatry

## 2018-01-23 ENCOUNTER — Ambulatory Visit (INDEPENDENT_AMBULATORY_CARE_PROVIDER_SITE_OTHER): Payer: BLUE CROSS/BLUE SHIELD

## 2018-01-23 ENCOUNTER — Ambulatory Visit (INDEPENDENT_AMBULATORY_CARE_PROVIDER_SITE_OTHER): Payer: BLUE CROSS/BLUE SHIELD | Admitting: Podiatry

## 2018-01-23 DIAGNOSIS — M1990 Unspecified osteoarthritis, unspecified site: Secondary | ICD-10-CM | POA: Diagnosis not present

## 2018-01-23 DIAGNOSIS — G8929 Other chronic pain: Secondary | ICD-10-CM | POA: Diagnosis not present

## 2018-01-23 DIAGNOSIS — Z969 Presence of functional implant, unspecified: Secondary | ICD-10-CM | POA: Diagnosis not present

## 2018-01-24 ENCOUNTER — Telehealth: Payer: Self-pay | Admitting: *Deleted

## 2018-01-24 DIAGNOSIS — M19079 Primary osteoarthritis, unspecified ankle and foot: Secondary | ICD-10-CM

## 2018-01-24 NOTE — Telephone Encounter (Signed)
Orders to J. Quintana, RN for pre-cert, faxed to [facility]. 

## 2018-01-24 NOTE — Telephone Encounter (Signed)
-----   Message from Trula Slade, DPM sent at 01/23/2018  5:23 PM EDT ----- Can you please order a CT scan of the right foot for surgical planning to evaluate the talo-navicular joint. Has collapse of the metatarsal-cuneiform joint and medial midfoot.

## 2018-01-25 ENCOUNTER — Telehealth: Payer: Self-pay | Admitting: *Deleted

## 2018-01-25 ENCOUNTER — Telehealth: Payer: Self-pay | Admitting: Podiatry

## 2018-01-25 NOTE — Telephone Encounter (Signed)
"  I'm calling to see if I can tentatively get on the schedule for Wednesday of next week with Dr.Wagoner.  I'm having the CT scan on Monday.  That's the only thing we are waiting on in order to do surgery.  So I was wondering if I could tentatively get on the schedule for next week.  Call me back please."

## 2018-01-25 NOTE — Progress Notes (Signed)
Subjective: 57 year old female presents the office with concerns of pain to the right foot that she wants to have surgery to fix the painful "knot" on the side of her foot.  She states that she has ongoing pain on daily basis to this.  She was to consider surgery at this point for it.  She has a new surgery soon as possible.  This is been ongoing issue for her.  She denies any recent injury or falls.  She is currently not working she is out on [employer].  She has numerous issues besides her foot including arthritis, fibromyalgia, bipolar and other issues. Denies any systemic complaints such as fevers, chills, nausea, vomiting. No acute changes since last appointment, and no other complaints at this time.   Objective: AAO x3, NAD DP/PT pulses palpable bilaterally, CRT less than 3 seconds Significant flattening of the arches bilaterally the right side worse than left.  There is tenderness palpation is in the first metatarsocuneiform joint on the medial aspect of the midfoot.  There is no specific area pinpoint tenderness.  Scars from prior surgery well-healed.  She has diffuse tenderness around the Lisfranc joint on the right foot outside the areas of the prior surgery.  No open lesions or pre-ulcerative lesions.  No pain with calf compression, swelling, warmth, erythema  Assessment: Collapse medial arch with hardware failure  Plan: -All treatment options discussed with the patient including all alternatives, risks, complications.  -X-rays were obtained reviewed.  There is collapse of the navicular cuneiform joint.  Hardware has failed.  She also has collapse of the talonavicular joint.  Significant arthritic changes present to the Lisfranc joint. -At this point we discussed surgical as well as conservative treatment.  Discussed possible medial column fusion.  However prior to this, get a CT scan of the foot to evaluate the joints for surgical planning.  We discussed the surgery as well as postoperative  course.  Discussed this is not a guarantee resolution of symptoms. -Patient encouraged to call the office with any questions, concerns, change in symptoms.   Trula Slade DPM

## 2018-01-25 NOTE — Telephone Encounter (Signed)
[facility] calling for pre-cert of CT 52/48/1859.

## 2018-01-26 ENCOUNTER — Other Ambulatory Visit: Payer: BLUE CROSS/BLUE SHIELD

## 2018-01-27 ENCOUNTER — Telehealth: Payer: Self-pay | Admitting: Podiatry

## 2018-01-27 NOTE — Telephone Encounter (Signed)
Ena Dawley with [facility] imaging called requesting auth for the patients appointment on Monday. Please call her at [phone].

## 2018-01-27 NOTE — Telephone Encounter (Signed)
I told [facility], Gretta Arab, RN - pre-cert coordinator had called twice and no PA was required. Ena Dawley states she will call [facility] get a reference number.

## 2018-01-30 ENCOUNTER — Ambulatory Visit
Admission: RE | Admit: 2018-01-30 | Discharge: 2018-01-30 | Disposition: A | Discharge status: Home / Self Care | Payer: BLUE CROSS/BLUE SHIELD | Source: Ambulatory Visit | Attending: Podiatry | Admitting: Podiatry

## 2018-01-30 DIAGNOSIS — M19079 Primary osteoarthritis, unspecified ankle and foot: Secondary | ICD-10-CM

## 2018-01-31 NOTE — Telephone Encounter (Signed)
"  I'm calling to see when [name] is scheduled for surgery."  We have her tentatively scheduled for tomorrow.  However, she had a CT scan done yesterday.  We just got the results so the doctor may not have reviewed it yet.  According to the notes, she may be having a medial cunieform fusion of the left foot.  "Has she been out of work for this condition?"  I am not aware of her being out of work.  The person that handles the disability process is not here today.  "Okay, that's all I need for now."

## 2018-01-31 NOTE — Telephone Encounter (Signed)
I am calling you about two things.  Dr. Jacqualyn Posey said for you to come in for a consult either Thursday or Friday.  "I made an appointment for Thursday already."  He cannot do your surgery until February 15, 2018.  "Why not sooner?"  He does not have anything sooner.  "Okay, I will see him Thursday."  (Dr. Jacqualyn Posey is requesting Dr. March Rummage to assist him. - Medial Column Fusion 3 hrs case)

## 2018-02-02 ENCOUNTER — Ambulatory Visit: Payer: BLUE CROSS/BLUE SHIELD | Admitting: Podiatry

## 2018-02-06 ENCOUNTER — Other Ambulatory Visit: Payer: Self-pay | Admitting: *Deleted

## 2018-02-06 ENCOUNTER — Ambulatory Visit (INDEPENDENT_AMBULATORY_CARE_PROVIDER_SITE_OTHER): Payer: BLUE CROSS/BLUE SHIELD | Admitting: Podiatry

## 2018-02-06 DIAGNOSIS — M19071 Primary osteoarthritis, right ankle and foot: Secondary | ICD-10-CM

## 2018-02-06 DIAGNOSIS — M2142 Flat foot [pes planus] (acquired), left foot: Secondary | ICD-10-CM | POA: Diagnosis not present

## 2018-02-06 DIAGNOSIS — M2141 Flat foot [pes planus] (acquired), right foot: Secondary | ICD-10-CM | POA: Diagnosis not present

## 2018-02-06 DIAGNOSIS — Z01818 Encounter for other preprocedural examination: Secondary | ICD-10-CM

## 2018-02-06 DIAGNOSIS — M1990 Unspecified osteoarthritis, unspecified site: Secondary | ICD-10-CM | POA: Diagnosis not present

## 2018-02-06 NOTE — Patient Instructions (Signed)
Pre-Operative Instructions  Congratulations, you have decided to take an important step towards improving your quality of life.  You can be assured that the doctors and staff at [facility] will be with you every step of the way.  Here are some important things you should know:  1. Plan to be at the surgery center/hospital at least 1 (one) hour prior to your scheduled time, unless otherwise directed by the surgical center/hospital staff.  You must have a responsible adult accompany you, remain during the surgery and drive you home.  Make sure you have directions to the surgical center/hospital to ensure you arrive on time. 2. If you are having surgery at [facility] or [facility], you will need a copy of your medical history and physical form from your family physician within one month prior to the date of surgery. We will give you a form for your primary physician to complete.  3. We make every effort to accommodate the date you request for surgery.  However, there are times where surgery dates or times have to be moved.  We will contact you as soon as possible if a change in schedule is required.   4. No aspirin/ibuprofen for one week before surgery.  If you are on aspirin, any non-steroidal anti-inflammatory medications (Mobic, Aleve, Ibuprofen) should not be taken seven (7) days prior to your surgery.  You make take Tylenol for pain prior to surgery.  5. Medications - If you are taking daily heart and blood pressure medications, seizure, reflux, allergy, asthma, anxiety, pain or diabetes medications, make sure you notify the surgery center/hospital before the day of surgery so they can tell you which medications you should take or avoid the day of surgery. 6. No food or drink after midnight the night before surgery unless directed otherwise by surgical center/hospital staff. 7. No alcoholic beverages 24-hours prior to surgery.  No smoking 24-hours prior or 24-hours after  surgery. 8. Wear loose pants or shorts. They should be loose enough to fit over bandages, boots, and casts. 9. Don't wear slip-on shoes. Sneakers are preferred. 10. Bring your boot with you to the surgery center/hospital.  Also bring crutches or a walker if your physician has prescribed it for you.  If you do not have this equipment, it will be provided for you after surgery. 11. If you have not been contacted by the surgery center/hospital by the day before your surgery, call to confirm the date and time of your surgery. 12. Leave-time from work may vary depending on the type of surgery you have.  Appropriate arrangements should be made prior to surgery with your employer. 13. Prescriptions will be provided immediately following surgery by your doctor.  Fill these as soon as possible after surgery and take the medication as directed. Pain medications will not be refilled on weekends and must be approved by the doctor. 14. Remove nail polish on the operative foot and avoid getting pedicures prior to surgery. 15. Wash the night before surgery.  The night before surgery wash the foot and leg well with water and the antibacterial soap provided. Be sure to pay special attention to beneath the toenails and in between the toes.  Wash for at least three (3) minutes. Rinse thoroughly with water and dry well with a towel.  Perform this wash unless told not to do so by your physician.  Enclosed: 1 Ice pack (please put in freezer the night before surgery)   1 Hibiclens skin cleaner     Pre-op instructions  If you have any questions regarding the instructions, please do not hesitate to call our office.  [place]: [address], [place], [place] [address] -- [phone]  [place]: [address] , [place] [address] -- [phone]  [place]: [address]  [place], [place] [address] -- [phone]  [place]: [address], [address], [place], [place] [address] -- [phone]  Website:  [email or web address] 

## 2018-02-06 NOTE — Progress Notes (Signed)
An order was placed for home health care.  I will contact [facility]

## 2018-02-07 LAB — COMPLETE METABOLIC PANEL WITH GFR
AG RATIO: 1.9 (calc) (ref 1.0–2.5)
ALBUMIN MSPROF: 3.9 g/dL (ref 3.6–5.1)
ALT: 9 U/L (ref 6–29)
AST: 12 U/L (ref 10–35)
Alkaline phosphatase (APISO): 62 U/L (ref 33–130)
BILIRUBIN TOTAL: 0.3 mg/dL (ref 0.2–1.2)
BUN: 7 mg/dL (ref 7–25)
CHLORIDE: 106 mmol/L (ref 98–110)
CO2: 26 mmol/L (ref 20–32)
Calcium: 9.5 mg/dL (ref 8.6–10.4)
Creat: 0.85 mg/dL (ref 0.50–1.05)
GFR, EST AFRICAN AMERICAN: 89 mL/min/{1.73_m2} (ref >=60)
GFR, Est Non African American: 77 mL/min/{1.73_m2} (ref >=60)
Globulin: 2.1 g/dL (calc) (ref 1.9–3.7)
Glucose, Bld: 88 mg/dL (ref 65–139)
POTASSIUM: 4.3 mmol/L (ref 3.5–5.3)
Sodium: 138 mmol/L (ref 135–146)
TOTAL PROTEIN: 6 g/dL — AB (ref 6.1–8.1)

## 2018-02-07 LAB — CBC WITH DIFFERENTIAL/PLATELET
BASOS ABS: 57 {cells}/uL (ref 0–200)
Basophils Relative: 0.8 %
EOS ABS: 57 {cells}/uL (ref 15–500)
Eosinophils Relative: 0.8 %
HEMATOCRIT: 39.4 % (ref 35.0–45.0)
HEMOGLOBIN: 13.5 g/dL (ref 11.7–15.5)
LYMPHS ABS: 2024 {cells}/uL (ref 850–3900)
MCH: 31.3 pg (ref 27.0–33.0)
MCHC: 34.3 g/dL (ref 32.0–36.0)
MCV: 91.2 fL (ref 80.0–100.0)
MPV: 11.4 fL (ref 7.5–12.5)
Monocytes Relative: 7.3 %
NEUTROS ABS: 4445 {cells}/uL (ref 1500–7800)
Neutrophils Relative %: 62.6 %
Platelets: 302 10*3/uL (ref 140–400)
RBC: 4.32 10*6/uL (ref 3.80–5.10)
RDW: 12.3 % (ref 11.0–15.0)
Total Lymphocyte: 28.5 %
WBC: 7.1 10*3/uL (ref 3.8–10.8)
WBCMIX: 518 {cells}/uL (ref 200–950)

## 2018-02-07 LAB — VITAMIN D 25 HYDROXY (VIT D DEFICIENCY, FRACTURES): Vit D, 25-Hydroxy: 29 ng/mL — ABNORMAL LOW (ref 30–100)

## 2018-02-09 NOTE — Progress Notes (Signed)
Subjective: 57 year old female presents the office today to discuss CT scan results and for possible surgical consultation.  She states that her right foot continues to hurt on daily basis she has a large "knot" on the side of her foot which is causing pain.  She cannot wear the orthotics or shoes because of this.  This is been ongoing issue for her.  We did discussion regards to the progression of her feet.  Several years ago she was having quite a bit of pain and she was on chronic pain medicine for this.  Her feet were then manageable for some time.  Prior to the original surgery on this foot she states the pain was unbearable and that the surgery did help for some time but she is back to having quite a bit of pain to her foot on a daily basis because of that she is having difficulty working.  She has no other concerns today. Denies any systemic complaints such as fevers, chills, nausea, vomiting. No acute changes since last appointment, and no other complaints at this time.   Objective: AAO x3, NAD DP/PT pulses palpable bilaterally, CRT less than 3 seconds Significant decrease in medial arch height bilaterally the right side worse than left.  There is a complete collapse medial column on weightbearing.  There is tenderness palpation of this area.  She also has other areas of tenderness to her foot on the Lisfranc joint mostly laterally.  There is no area pinpoint tenderness.  Achilles tendon appears to be intact. No open lesions or pre-ulcerative lesions.  No pain with calf compression, swelling, warmth, erythema  Assessment: Medial column collapse, rheumatoid arthritis  Plan: -All treatment options discussed with the patient including all alternatives, risks, complications.  -We discussed the CT scan results.  We also went over the previous x-rays.  A long discussion regards to treatment options.  At this point she wants to proceed with surgery.  I think her best options would be a medial column  fusion and we discussed the surgery as well as postoperative course.  We had a discussion but this is not going to completely eliminate her pain and she is going continue to have pain in other areas of her foot given her arthritis, fibromyalgia.  Hopefully the surgery will be beneficial to help with the arch that way she can wear regular shoes but is not a guarantee.  She is having no specific pain over the area the previous hardware from the prior Lisfranc fusion.  Because this would leave the hardware intact.  She is also aware that the surgery may not be helpful she may continue to have pain or worsening of pain. -We will plan for medial column fusion in 2 weeks -The incision placement as well as the postoperative course was discussed with the patient. I discussed risks of the surgery which include, but not limited to, infection, bleeding, pain, swelling, need for further surgery, delayed or nonhealing, painful or ugly scar, numbness or sensation changes, over/under correction, recurrence, transfer lesions, further deformity, hardware failure, DVT/PE, loss of toe/foot. Patient understands these risks and wishes to proceed with surgery. The surgical consent was reviewed with the patient all 3 pages were signed. No promises or guarantees were given to the outcome of the procedure. All questions were answered to the best of my ability. Before the surgery the patient was encouraged to call the office if there is any further questions. The surgery will be performed at the [facility] on an outpatient  basis. -Blood work ordered   Trula Slade DPM

## 2018-02-13 ENCOUNTER — Telehealth: Payer: Self-pay | Admitting: *Deleted

## 2018-02-13 ENCOUNTER — Other Ambulatory Visit: Payer: Self-pay | Admitting: Podiatry

## 2018-02-13 MED ORDER — CEPHALEXIN 500 MG PO CAPS: 500.0000 mg | ORAL_CAPSULE | Freq: Three times a day (TID) | ORAL | 0 refills | Status: DC

## 2018-02-13 MED ORDER — ENOXAPARIN SODIUM 40 MG/0.4ML ~~LOC~~ SOLN: 40.0000 mg | SUBCUTANEOUS | 0 refills | Status: DC

## 2018-02-13 MED ORDER — PROMETHAZINE HCL 25 MG PO TABS: 25.0000 mg | ORAL_TABLET | Freq: Three times a day (TID) | ORAL | 0 refills | Status: DC | PRN

## 2018-02-13 MED ORDER — HYDROMORPHONE HCL 2 MG PO TABS: 2.0000 mg | ORAL_TABLET | ORAL | 0 refills | Status: DC | PRN

## 2018-02-13 NOTE — Progress Notes (Signed)
Postop medications sent 

## 2018-02-13 NOTE — Telephone Encounter (Signed)
I sent lovenox to [facility] and the dilaudid, keflex, phenergan to [facility]

## 2018-02-13 NOTE — Telephone Encounter (Signed)
Pt states she would like 2 days of her medication sent to the [facility] on [address] with her pain medication and all of her other medications sent to the [facility].

## 2018-02-14 ENCOUNTER — Other Ambulatory Visit: Payer: Self-pay | Admitting: Podiatry

## 2018-02-14 ENCOUNTER — Telehealth: Payer: Self-pay | Admitting: Podiatry

## 2018-02-14 MED ORDER — VITAMIN D (ERGOCALCIFEROL) 1.25 MG (50000 UNIT) PO CAPS: 50000.0000 [IU] | ORAL_CAPSULE | ORAL | 0 refills | Status: DC

## 2018-02-14 NOTE — Progress Notes (Signed)
Vitmain D was mildly decreased. 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 Also the importance of eating enough protein and healthy diet.

## 2018-02-14 NOTE — Telephone Encounter (Signed)
I informed pt of Dr. Leigh Aurora orders and pharmacy distribution.

## 2018-02-14 NOTE — Telephone Encounter (Signed)
I left a message yesterday, for you to do a couple of days of my medicine at [facility], and then the rest at [facility]. Do all the pain med at [facility].

## 2018-02-14 NOTE — Telephone Encounter (Signed)
-----   Message from Trula Slade, DPM sent at 02/14/2018  7:43 AM EDT ----- Tivis Ringer- can you please let her know that her Vit D is very minimally decreased. Lets start vitamin D 50,000 weekly. I sent this to her pharmacy. Her protein was a little low as well. It is important to help with healing that she eat enough protein and a well balanced diet.

## 2018-02-14 NOTE — Telephone Encounter (Signed)
Left message due to the importance of the lab results, orders and healing, explaining Dr. Leigh Aurora review of results and orders.

## 2018-02-15 ENCOUNTER — Encounter: Payer: Self-pay | Admitting: Podiatry

## 2018-02-15 DIAGNOSIS — M2141 Flat foot [pes planus] (acquired), right foot: Secondary | ICD-10-CM

## 2018-02-15 DIAGNOSIS — Z967 Presence of other bone and tendon implants: Secondary | ICD-10-CM | POA: Diagnosis not present

## 2018-02-15 DIAGNOSIS — M05571 Rheumatoid polyneuropathy with rheumatoid arthritis of right ankle and foot: Secondary | ICD-10-CM

## 2018-02-16 ENCOUNTER — Telehealth: Payer: Self-pay | Admitting: *Deleted

## 2018-02-16 ENCOUNTER — Other Ambulatory Visit: Payer: Self-pay | Admitting: Podiatry

## 2018-02-16 ENCOUNTER — Telehealth: Payer: Self-pay | Admitting: Podiatry

## 2018-02-16 DIAGNOSIS — Z9889 Other specified postprocedural states: Secondary | ICD-10-CM

## 2018-02-16 DIAGNOSIS — M19071 Primary osteoarthritis, right ankle and foot: Secondary | ICD-10-CM

## 2018-02-16 MED ORDER — IBUPROFEN 800 MG PO TABS: 800.0000 mg | ORAL_TABLET | Freq: Three times a day (TID) | ORAL | 0 refills | Status: DC | PRN

## 2018-02-16 MED ORDER — HYDROMORPHONE HCL 4 MG PO TABS: 4.0000 mg | ORAL_TABLET | ORAL | 0 refills | Status: DC | PRN

## 2018-02-16 NOTE — Progress Notes (Unsigned)
di

## 2018-02-16 NOTE — Telephone Encounter (Addendum)
I called [place], NP and explained pt had extensive ankle surgery yesterday, is taking dilaudid 2mg  one tablet every 4 hours, tylenol 500mg  one tablet 4 times a day, and will be taking ibuprofen 800mg  one tablet 3 times a day, and requested assistance in managing pt's break through pain. Derrick, NP asked if pt was continuing the oxycodone 20mg  prescribed by [facility], if so may increase the Dilaudid to 4mg  one tablet every 4 hours for 5 days, but if not taking the oxycodone 20mg  then go back on it and continue the dilaudid 2mg  every 4 hours. Derrick, NP states pt will need a note for her next office visit with them explaining the post op medication dilaudid.

## 2018-02-16 NOTE — Telephone Encounter (Signed)
I called pt, she states her foot is killing her right at the ankle, the dilaudid has not helped, she doubled up on it once, and then another time took within 2 hours of the last dose. I asked pt if she was in a cast. Pt states she is putting ice behind the knee and over the foot. Pt states she is in some contraption that is along the bottom of her foot and leg. I told her it sounded like a sugar tong splint and I would check how much she could remove of the top wraps.

## 2018-02-16 NOTE — Telephone Encounter (Signed)
I called pt, asked if she was taking the oxycodone 20mg  from [facility]. Pt states she is taking the oxycodone 20mg  every 4 hours, and has taken the ace wrap off over the foot and has received some relief. I informed pt of Dr. Leigh Aurora request for additional pain management recommendations from [facility] and that she should continue the oxycodone 20mg  as ordered by [facility] and Dr. Jacqualyn Posey would send orders to the [facility] on [address] for the increase dilaudid dosing. Pt states understanding.

## 2018-02-16 NOTE — Telephone Encounter (Signed)
[facility] states they will be going to pt's house to established in their system and wanted to know the wound care orders. I told [facility] their would be no wound care orders until after 1st POV 02/20/2018 10:45am.

## 2018-02-16 NOTE — Telephone Encounter (Addendum)
I spoke with Dr. March Rummage and he stated have pt remove the last ace wrap, and take 500mg  Tylenol 4 times a day, and Dr. Jacqualyn Posey would be in from surgery in 1 hour. I informed pt of Dr. Eleanora Neighbor recommendations and I would call again once DR. Danbury arrived.

## 2018-02-16 NOTE — Telephone Encounter (Signed)
Sent dilaudid  

## 2018-02-16 NOTE — Addendum Note (Signed)
Addended by: Harriett Sine D on: 02/16/2018 11:31 AM   Modules accepted: Orders

## 2018-02-16 NOTE — Telephone Encounter (Signed)
Dr. Jacqualyn Posey ordered Ibuprofen 800mg  #15 one tablet 3 times a day, and call Dr. Mirna Mires with [facility] for recommendations for pain management. I called pt, informed pt of Dr. Leigh Aurora orders. Pt states understanding and that she felt the wrap over the ankle was too tight, she had removed the one around the upper leg. I told pt to replace the upper leg wrap, this would help stabilize the splint, and then remove the lower wrap and elevate.

## 2018-02-16 NOTE — Telephone Encounter (Signed)
Pt had surgery yesterday and she has been in severe pain all night. The pain medication that was prescribed is not even touching the pain she is having please give her a call.

## 2018-02-17 ENCOUNTER — Telehealth: Payer: Self-pay | Admitting: *Deleted

## 2018-02-17 NOTE — Telephone Encounter (Signed)
I called pt and informed I had sent orders to [facility] and would also fax to [facility] in case [facility] was not convenient. Pt states she is doing okay. Faxed to [facility] supply.

## 2018-02-17 NOTE — Telephone Encounter (Signed)
Faxed orders for crutches to [facility], emailed A. Barnet Glasgow.

## 2018-02-17 NOTE — Telephone Encounter (Signed)
Called and spoke with the patient and the patient stated that she was doing fine and that there was not any fever or chills and no nausea and patient stated that she would see [place] on Monday and I stated that if any concerns or questions to call the office at [phone]. Lattie Haw

## 2018-02-17 NOTE — Telephone Encounter (Signed)
I'm calling because I thought I had crutches at home but these are not any good. I need to get a new set. I just had surgery on Wednesday with Dr. Jacqualyn Posey. Marcy Siren, if you can call me back and let me know where I can go get the crutches. My number is [phone]. Thank you hun. Bye.

## 2018-02-17 NOTE — Addendum Note (Signed)
Addended by: Harriett Sine D on: 02/17/2018 08:51 AM   Modules accepted: Orders

## 2018-02-20 ENCOUNTER — Ambulatory Visit (INDEPENDENT_AMBULATORY_CARE_PROVIDER_SITE_OTHER): Payer: BLUE CROSS/BLUE SHIELD | Admitting: Podiatry

## 2018-02-20 ENCOUNTER — Ambulatory Visit (INDEPENDENT_AMBULATORY_CARE_PROVIDER_SITE_OTHER): Payer: BLUE CROSS/BLUE SHIELD

## 2018-02-20 ENCOUNTER — Encounter: Payer: Self-pay | Admitting: Podiatry

## 2018-02-20 DIAGNOSIS — Z981 Arthrodesis status: Secondary | ICD-10-CM | POA: Diagnosis not present

## 2018-02-20 DIAGNOSIS — M19071 Primary osteoarthritis, right ankle and foot: Secondary | ICD-10-CM | POA: Diagnosis not present

## 2018-02-20 DIAGNOSIS — Z9889 Other specified postprocedural states: Secondary | ICD-10-CM

## 2018-02-20 MED ORDER — HYDROMORPHONE HCL 4 MG PO TABS: 4.0000 mg | ORAL_TABLET | ORAL | 0 refills | Status: AC | PRN

## 2018-02-21 ENCOUNTER — Other Ambulatory Visit: Payer: Self-pay | Admitting: Podiatry

## 2018-02-21 ENCOUNTER — Telehealth: Payer: Self-pay | Admitting: Podiatry

## 2018-02-21 DIAGNOSIS — Z981 Arthrodesis status: Secondary | ICD-10-CM

## 2018-02-21 NOTE — Progress Notes (Signed)
Subjective: [name] is a 57 y.o. is seen today in office s/p right medial column fusion preformed on 02/15/2018.  She said her pain is much better controlled now.  She states that 4 mg every 4 hours is been very helpful for her she is asking for refill.  She states that she is been nonweightbearing as much as possible.  She has been icing elevating as well.  Denies any systemic complaints such as fevers, chills, nausea, vomiting. No calf pain, chest pain, shortness of breath.   Objective: General: No acute distress, AAOx3  DP/PT pulses palpable 2/4, CRT < 3 sec to all digits.  Protective sensation intact. Motor function intact.  RIGHT foot: Incision is well coapted without any evidence of dehiscence with sutures and staples intact. There is no surrounding erythema, ascending cellulitis, fluctuance, crepitus, malodor, drainage/purulence. There is mild edema around the surgical site. There is mild pain along the surgical site.  Overall incisions healing without any signs of infection.  After looking her foot today she states that she is very pleased because the "bulge" that she had before surgery is no longer present. No other areas of tenderness to bilateral lower extremities.  No other open lesions or pre-ulcerative lesions.  No pain with calf compression, swelling, warmth, erythema.   Assessment and Plan:  Status post right foot surgery, doing well with no complications   -Treatment options discussed including all alternatives, risks, and complications -X-rays were obtained reviewed.  Hardware intact status post medial column fusion. -Patient was painted over the incision followed by dry sterile dressing.  A well-padded below-knee fiberglass cast was applied making sure to pad all bony prominences. -Ice/elevation -Pain medication as needed-refill today.  Hopefully when she finishes this we can decrease the amount of pain medicine she is getting. -Monitor for any clinical signs or symptoms of  infection and DVT/PE and directed to call the office immediately should any occur or go to the ER. -Follow-up in 10 days for cast change or sooner if any problems arise. In the meantime, encouraged to call the office with any questions, concerns, change in symptoms.   Celesta Gentile, DPM

## 2018-02-21 NOTE — Telephone Encounter (Signed)
Thanks

## 2018-02-21 NOTE — Telephone Encounter (Signed)
This is Water engineer, RN with [employer]. I'm calling to let Dr. Jacqualyn Posey know that [name] has refused further visits from [facility] because she now has a cast. If you have any questions, you can contact me at [phone]. Thank you.

## 2018-02-27 ENCOUNTER — Other Ambulatory Visit: Payer: Self-pay | Admitting: Podiatry

## 2018-02-27 ENCOUNTER — Telehealth: Payer: Self-pay | Admitting: *Deleted

## 2018-02-27 MED ORDER — HYDROMORPHONE HCL 2 MG PO TABS: 2.0000 mg | ORAL_TABLET | ORAL | 0 refills | Status: DC | PRN

## 2018-02-27 NOTE — Telephone Encounter (Signed)
Left message informing pt Dr. March Rummage had sent her dilaudid to the [facility].

## 2018-02-27 NOTE — Telephone Encounter (Signed)
Pt states she needs a prescription for  Dilaudid 2mg , Dr. Jacqualyn Posey was going to decrease to the 2mg , and she has been taking dilaudid 2mg  from a previous prescription and only has enough for today and would like a refill.

## 2018-02-27 NOTE — Progress Notes (Unsigned)
Rx sent to [facility]

## 2018-03-02 ENCOUNTER — Ambulatory Visit (INDEPENDENT_AMBULATORY_CARE_PROVIDER_SITE_OTHER): Payer: BLUE CROSS/BLUE SHIELD | Admitting: Podiatry

## 2018-03-02 ENCOUNTER — Encounter: Payer: BLUE CROSS/BLUE SHIELD | Admitting: Podiatry

## 2018-03-02 DIAGNOSIS — Z981 Arthrodesis status: Secondary | ICD-10-CM

## 2018-03-02 DIAGNOSIS — Z9889 Other specified postprocedural states: Secondary | ICD-10-CM

## 2018-03-03 ENCOUNTER — Telehealth: Payer: Self-pay | Admitting: Podiatry

## 2018-03-03 NOTE — Telephone Encounter (Signed)
Pt states Derrick from [facility] contacted her and they have everything worked out.

## 2018-03-03 NOTE — Telephone Encounter (Signed)
I called pt and informed we had a call in to [facility] for advice on how to manage her surgical pain, that the dilaudid 2mg  was not touching her pain. Pt states when she saw pain management on Friday she told them she would try to manage her pain with the medication she had and the dilaudid 2mg  but that is not touching the pain.

## 2018-03-03 NOTE — Telephone Encounter (Signed)
I spoke with Rhianna - [facility], she states Velna Hatchet is out of the office, and she will have Derrick call. I told Rhianna, we are calling for information on how to manage pt's surgical pain, she is not taking the dilaudid because it does not help.

## 2018-03-05 NOTE — Progress Notes (Signed)
Subjective: [name] is a 57 y.o. is seen today in office s/p right medial column fusion preformed on 02/15/2018.  She states that she is doing well.  She is just taking her normal pain medicine at this point as the 2 mg Dilaudid is not helping.  She states the cast is fitting well and she had no problems with it was a "perfect" cast.  She denies any recent injury or falls but she does admit that she is been putting weight on the cast. Denies any systemic complaints such as fevers, chills, nausea, vomiting. No calf pain, chest pain, shortness of breath.   Objective: General: No acute distress, AAOx3  DP/PT pulses palpable 2/4, CRT < 3 sec to all digits.  Protective sensation intact. Motor function intact.  RIGHT foot: Incision is well coapted without any evidence of dehiscence with sutures and staples intact.  There is minimal swelling to the foot mostly is just along the medial aspect of the surgical site.  There is no erythema or warmth there is no ascending cellulitis.  There is no fluctuation or crepitation or any clinical signs of infection present today.  Minimal tenderness palpation along the surgical site. No other areas of tenderness to bilateral lower extremities.  No other open lesions or pre-ulcerative lesions.  No pain with calf compression, swelling, warmth, erythema.   Assessment and Plan:  Status post right foot surgery, doing well with no complications   -Treatment options discussed including all alternatives, risks, and complications -Sutures and staples are intact I left them intact today there is still some mild motion across the incision.  Antibiotic was applied to the incision followed by dry sterile dressing.  A new well-padded fiberglass below-knee cast was applied making sure to pad all bony prominences.  Encouraged ice and elevation.  Continue pain medications as prescribed by pain management.  Also just called my office contacted in regards to her pain management schedule  as the Dilaudid is not been helping her actual regular scheduled been helping but she has been taking her regular medicine a little bit more frequent.  *Next appointment we will likely change the cast and take out the sutures.  Trula Slade DPM

## 2018-03-05 NOTE — Progress Notes (Signed)
See clinic note from earlier today. She was put on both mine and the nurse schedule. I dictated her note on the appointment earlier today.

## 2018-03-10 ENCOUNTER — Ambulatory Visit (HOSPITAL_COMMUNITY)
Admission: RE | Admit: 2018-03-10 | Discharge: 2018-03-10 | Disposition: A | Discharge status: Home / Self Care | Payer: BLUE CROSS/BLUE SHIELD | Source: Ambulatory Visit | Attending: Cardiology | Admitting: Cardiology

## 2018-03-10 ENCOUNTER — Ambulatory Visit (INDEPENDENT_AMBULATORY_CARE_PROVIDER_SITE_OTHER): Payer: BLUE CROSS/BLUE SHIELD | Admitting: Podiatry

## 2018-03-10 ENCOUNTER — Telehealth: Payer: Self-pay | Admitting: *Deleted

## 2018-03-10 ENCOUNTER — Ambulatory Visit (INDEPENDENT_AMBULATORY_CARE_PROVIDER_SITE_OTHER): Payer: BLUE CROSS/BLUE SHIELD

## 2018-03-10 VITALS — BP 95/70 | HR 103 | Temp 97.3°F

## 2018-03-10 DIAGNOSIS — Z981 Arthrodesis status: Secondary | ICD-10-CM

## 2018-03-10 DIAGNOSIS — M79662 Pain in left lower leg: Secondary | ICD-10-CM | POA: Diagnosis not present

## 2018-03-10 DIAGNOSIS — R609 Edema, unspecified: Secondary | ICD-10-CM | POA: Insufficient documentation

## 2018-03-10 DIAGNOSIS — Z9889 Other specified postprocedural states: Secondary | ICD-10-CM

## 2018-03-10 NOTE — Telephone Encounter (Signed)
Pt request [name] - [facility] to have the same orders as last time sent to [facility].

## 2018-03-10 NOTE — Telephone Encounter (Signed)
I informed pt of Derrick's statement and she states understanding.

## 2018-03-10 NOTE — Telephone Encounter (Signed)
Falecha - [facility] states send pt to their office now and they will work her in for venous doppler r/o DVT. Faxed [facility] to test right lower calf to [place].

## 2018-03-10 NOTE — Telephone Encounter (Signed)
I called [facility] and spoke with Montine Circle and he states he will discuss request with his supervising doctor. Derrick asked that I contact pt with current status and he will contact pt with new orders.

## 2018-03-11 ENCOUNTER — Telehealth: Payer: Self-pay | Admitting: Podiatry

## 2018-03-11 NOTE — Telephone Encounter (Signed)
Patient called in this morning stating that she needed a cast change because it wasn't feeling right. Dr. March Rummage offered to see her today at 11:30am but she stated that she would wait to see Dr. Jacqualyn Posey on Monday 03/13/2018 @ 2:45pm

## 2018-03-12 NOTE — Progress Notes (Signed)
Subjective: [name] is a 57 y.o. is seen today in office s/p right medial column fusion preformed on 02/15/2018.  She admits that she has been putting weight on her foot at times.  She states there is this time that she could not be nonweightbearing and she has been walking on it.  She states that she has been some pain in the calf.  She has not been taking the aspirin because she states that she forgot about it and she has finished the Lovenox injections.  Otherwise she is doing well.  Pain management is controlling her pain medicine at this time.  She denies any fevers, chills, nausea, vomiting.  No chest pain, shortness of breath.  Objective: General: No acute distress, AAOx3  DP/PT pulses palpable 2/4, CRT < 3 sec to all digits.  Protective sensation intact. Motor function intact.  RIGHT foot: Cast is intact however it is broken on the bottom and very dirty.  Upon removal the incision is well coapted without any evidence of dehiscence with sutures and staples intact.  There is mild swelling to the foot mostly is just along the medial aspect of the surgical site.  There is no erythema or warmth there is no ascending cellulitis.  There is no fluctuation or crepitation or any clinical signs of infection present today.  Minimal tenderness palpation along the surgical site.  No other areas of tenderness. No other areas of tenderness to bilateral lower extremities.  No other open lesions or pre-ulcerative lesions.  Mild swelling to the catheter there is no pain with calf compression, erythema or warmth.  Assessment and Plan:  Status post right foot surgery, doing well with no complications   -Treatment options discussed including all alternatives, risks, and complications -Today I remove the cast.  High place a posterior splint with a venous duplex to rule out a DVT.  She came back to the office after this was done which is negative and I applied a well-padded below-knee fiberglass cast making sure  to pad all bony prominences.  Betadine gel was applied followed by a dry sterile dressing prior to cast application.  I did not remove the sutures or staples because her mild increase in swelling.  There is no clinical signs of infection noted to the foot. -Encouraged nonweightbearing.  Continue ice elevate.  -Continue pain medicines as prescribed by pain management.  I will try to help. -She has a wheeze to her today.  She is been on steroids because of this and is not getting better.  I encouraged her to follow-up with her primary care physician.  She continues to smoke I discussed with her that this can greatly decrease her healing and I think this is part of why she is having healing problems. -Aspirin daily  *Recheck vitamin D level next appointment  Trula Slade DPM

## 2018-03-13 ENCOUNTER — Ambulatory Visit (INDEPENDENT_AMBULATORY_CARE_PROVIDER_SITE_OTHER): Payer: BLUE CROSS/BLUE SHIELD | Admitting: Podiatry

## 2018-03-13 DIAGNOSIS — Z4789 Encounter for other orthopedic aftercare: Secondary | ICD-10-CM

## 2018-03-20 NOTE — Progress Notes (Signed)
Subjective: [name] is a 57 y.o. is seen today in office s/p right medial column fusion preformed on 02/15/2018.  She presents today because the cast is causing irritation she was to have a new cast applied.  Otherwise she is doing well she is interested of her foot over the weekend.  Pain management is controlling the pain medicine at this point.  Pain management is controlling her pain medicine at this time.  She denies any fevers, chills, nausea, vomiting.  No chest pain, shortness of breath.  Objective: General: No acute distress, AAOx3  DP/PT pulses palpable 2/4, CRT < 3 sec to all digits.  Protective sensation intact. Motor function intact.  RIGHT foot: Cast is intact.  Subjectively she says is causing irritation.  Removed there is no skin irritation identified there is no erythema or pre-ulcerative areas.  Incision appears to be healing well staples, sutures are intact without any swelling erythema, ascending cellulitis.  There is no fluctuation or crepitation or malodor.  Mild tenderness to palpation the surgical site.  No pain with calf compression.  Calf is supple No other open lesions or pre-ulcerative lesions.   Assessment and Plan:  Status post right foot surgery, doing well with no complications -presents for cast change  -Treatment options discussed including all alternatives, risks, and complications -Today I remove the cast.  Antibiotic ointment was applied to the incision followed by dry sterile dressing.  A new below-knee well-padded fiberglass cast was applied making sure to pad all bony prominences.  This appeared to be fitting well and was comfortable for her.  Continue nonweightbearing. -Aspirin daily  *Recheck vitamin D level next appointment  Trula Slade DPM

## 2018-03-21 ENCOUNTER — Encounter: Payer: BLUE CROSS/BLUE SHIELD | Admitting: Podiatry

## 2018-03-24 ENCOUNTER — Ambulatory Visit (INDEPENDENT_AMBULATORY_CARE_PROVIDER_SITE_OTHER): Payer: BLUE CROSS/BLUE SHIELD | Admitting: Podiatry

## 2018-03-24 ENCOUNTER — Encounter: Payer: Self-pay | Admitting: Podiatry

## 2018-03-24 ENCOUNTER — Telehealth: Payer: Self-pay | Admitting: Podiatry

## 2018-03-24 DIAGNOSIS — Z981 Arthrodesis status: Secondary | ICD-10-CM

## 2018-03-24 DIAGNOSIS — M19071 Primary osteoarthritis, right ankle and foot: Secondary | ICD-10-CM | POA: Diagnosis not present

## 2018-03-24 DIAGNOSIS — R7989 Other specified abnormal findings of blood chemistry: Secondary | ICD-10-CM

## 2018-03-24 NOTE — Telephone Encounter (Signed)
Dr. Jacqualyn Posey states pt can take a shower but can not get the surgery site wet. I informed pt.

## 2018-03-24 NOTE — Telephone Encounter (Signed)
Pt was seen today for Post OP and forgot to ask if she is able to shower or not. Please call patient.

## 2018-03-28 NOTE — Progress Notes (Signed)
Subjective: [name] is a 57 y.o. is seen today in office s/p right medial column fusion preformed on 02/15/2018.  Overall she states that she is doing well.  She states that she has not been taking the vitamin D as prescribed previously.  She is been trying to be nonweightbearing much as possible but she does admit that she is been putting weight on her foot.  She denies any recent injury or falls.  Pain management is controlling her pain medicine at this time. Her pain is controlled   She denies any fevers, chills, nausea, vomiting.  No chest pain, shortness of breath.  Objective: General: No acute distress, AAOx3  DP/PT pulses palpable 2/4, CRT < 3 sec to all digits.  Protective sensation intact. Motor function intact.  RIGHT foot: Cast is intact but dirty.  Upon removal incision appears to be healing well there is no surrounding erythema, ascending cellulitis.  Is no drainage or pus.  Sutures are intact.  Mild swelling to the area.  Pain is controlled and only minimal tenderness palpation.  No other areas of tenderness identified today. There is no pain with calf compression, swelling, warmth, erythema in the calf is supple No other open lesions or pre-ulcerative lesions.   Assessment and Plan:  Status post right foot surgery, doing well with no complications -presents for cast change  -Treatment options discussed including all alternatives, risks, and complications -Sutures removed today without complications.  After removal incision remained well coapted.  Steri-Strips were applied for reinforcement followed by antibiotic ointment and a bandage.  She can start to change the bandage every other day as needed.  Continue nonweightbearing.  She is placed into a cam boot today to start some gentle range of motion exercises for the ankle.  She has not been taking vitamin D but would recheck a vitamin D level today and a prescription was given to get this lab checked.  Continue ice  elevate.  Trula Slade DPM

## 2018-03-30 LAB — VITAMIN D 25 HYDROXY (VIT D DEFICIENCY, FRACTURES): VIT D 25 HYDROXY: 32 ng/mL (ref 30–100)

## 2018-04-03 ENCOUNTER — Telehealth: Payer: Self-pay | Admitting: *Deleted

## 2018-04-03 ENCOUNTER — Ambulatory Visit (INDEPENDENT_AMBULATORY_CARE_PROVIDER_SITE_OTHER): Payer: BLUE CROSS/BLUE SHIELD

## 2018-04-03 ENCOUNTER — Ambulatory Visit (INDEPENDENT_AMBULATORY_CARE_PROVIDER_SITE_OTHER): Payer: BLUE CROSS/BLUE SHIELD | Admitting: Podiatry

## 2018-04-03 DIAGNOSIS — M19071 Primary osteoarthritis, right ankle and foot: Secondary | ICD-10-CM

## 2018-04-03 DIAGNOSIS — Z981 Arthrodesis status: Secondary | ICD-10-CM

## 2018-04-03 NOTE — Telephone Encounter (Signed)
I informed pt of Dr. Leigh Aurora review of results and she asked if she needed a refill of the D.

## 2018-04-03 NOTE — Telephone Encounter (Signed)
-----   Message from Trula Slade, DPM sent at 03/30/2018  7:10 AM EST ----- Please let her know that her vitamin D is on the low end of normal.

## 2018-04-03 NOTE — Telephone Encounter (Signed)
We can hold further vitamin D prescription but would take an OTC Vitamin D supplement

## 2018-04-04 NOTE — Telephone Encounter (Signed)
I informed pt of Dr. Wagoner's orders. 

## 2018-04-09 NOTE — Progress Notes (Signed)
Subjective: [name] is a 57 y.o. is seen today in office s/p right medial column fusion preformed on 02/15/2018.  She presents today for staple removal.  Overall she states that she is doing well.  She does admit that she is been putting some weight on the foot and she gets some discomfort but overall she is been doing well.  She still on pain medicine as directed by pain management.  She denies any increase in swelling or redness of the foot.  She only gets pain on the surgical site with weightbearing but no other areas of tenderness she states.  She denies any recent injury or falls.  Pain management is controlling her pain medicine at this time. Her pain is controlled   She denies any fevers, chills, nausea, vomiting.  No chest pain, shortness of breath.  Objective: General: No acute distress, AAOx3  DP/PT pulses palpable 2/4, CRT < 3 sec to all digits.  Protective sensation intact. Motor function intact.  RIGHT foot: Cam boot intact.  Staples are intact, incision incision remains well coapted.  There is some scab present overlying the incision.  There is no erythema or increase in warmth there is no ascending cellulitis.  There is no drainage or pus.  No fluctuation or crepitation is no clinical signs of infection noted today.  Mild tenderness palpation gently on surgical site but there is no other areas of tenderness identified today.  There is no pain with calf compression, swelling, warmth, erythema in the calf is supple No other open lesions or pre-ulcerative lesions.   Assessment and Plan:  Status post right foot surgery, doing well with no complications   -Treatment options discussed including all alternatives, risks, and complications -X-rays were obtained reviewed.  Hardware intact without any evidence of breakage and no evidence of acute fracture. -Staples removed today without complication.  Incision remained well coapted but I applied Steri-Strips for reinforcement.  Antibiotic  ointment was applied followed by dressing.  She can start to shower tomorrow and will reapply a bandage afterwards.  Continue nonweightbearing the cam boot.  I want her to do some passive range of motion exercises of the ankle.  Continue ice and elevate as well.  We will see her back in about 2 weeks at that point start partial weightbearing as able.   Trula Slade DPM

## 2018-04-17 ENCOUNTER — Ambulatory Visit (INDEPENDENT_AMBULATORY_CARE_PROVIDER_SITE_OTHER): Payer: BLUE CROSS/BLUE SHIELD | Admitting: Podiatry

## 2018-04-17 ENCOUNTER — Ambulatory Visit: Payer: BLUE CROSS/BLUE SHIELD | Admitting: Podiatry

## 2018-04-17 DIAGNOSIS — M19071 Primary osteoarthritis, right ankle and foot: Secondary | ICD-10-CM

## 2018-04-17 DIAGNOSIS — Z981 Arthrodesis status: Secondary | ICD-10-CM

## 2018-04-19 NOTE — Progress Notes (Signed)
Subjective: [name] is a 57 y.o. is seen today in office s/p right medial column fusion preformed on 02/15/2018. She states that she is doing well. She does put weight on her foot at times and she gets discomfort. She she wears the boot majority time but she does admit that she does walk without the boot.  When she goes barefoot this when she gets discomfort.  She denies any recent injury or falls.  She has no new concerns.  Pain management is controlling her pain medicine at this time. Her pain is controlled   She denies any fevers, chills, nausea, vomiting.  No chest pain, shortness of breath.  Objective: General: No acute distress, AAOx3  DP/PT pulses palpable 2/4, CRT < 3 sec to all digits.  Protective sensation intact. Motor function intact.  RIGHT foot: Cam boot intact.  Incision is a mild superficial dehiscence towards the distal portion of the incision but there is no drainage or pus.  There is no surrounding erythema, ascending cellulitis.  There is no fluctuation or crepitation or any malodor.  The remainder the incision appears to be healing well.  Mild edema to the surgical site with mild tenderness revealed the surgical site but there is no other areas of tenderness identified at this time.  Left foot is holding up well. There is no pain with calf compression, swelling, warmth, erythema in the calf is supple No other open lesions or pre-ulcerative lesions.   Assessment and Plan:  Status post right foot surgery, doing well with no complications   -Treatment options discussed including all alternatives, risks, and complications -I cleaned the distal portion of the incision today.  Recommend Betadine ointment dressing changes daily which I did give her some today.  Also Steri-Strips to help hold the incision together.  There is no signs of infection going to continue to monitor closely.  For now continue nonweightbearing he must wear cam boot at all times.  *X-rays next  appointment  Trula Slade DPM

## 2018-05-08 ENCOUNTER — Ambulatory Visit (INDEPENDENT_AMBULATORY_CARE_PROVIDER_SITE_OTHER): Payer: BLUE CROSS/BLUE SHIELD | Admitting: Podiatry

## 2018-05-08 ENCOUNTER — Encounter: Payer: Self-pay | Admitting: Podiatry

## 2018-05-08 ENCOUNTER — Ambulatory Visit (INDEPENDENT_AMBULATORY_CARE_PROVIDER_SITE_OTHER): Payer: BLUE CROSS/BLUE SHIELD

## 2018-05-08 DIAGNOSIS — M19071 Primary osteoarthritis, right ankle and foot: Secondary | ICD-10-CM | POA: Diagnosis not present

## 2018-05-08 DIAGNOSIS — Z981 Arthrodesis status: Secondary | ICD-10-CM

## 2018-05-18 NOTE — Progress Notes (Signed)
Subjective: [name] is a 58 y.o. is seen today in office s/p right medial column fusion preformed on 02/15/2018. She has tried to stay off of her foot when she can however she does admit that she does not walk in the boot and sometimes without the boot.  She said overall it does feel much better.  The incision almost healed.  Denies any drainage or redness to the incision site.  She has no other concerns today.  Pain management is controlling her pain medicine at this time. Her pain is controlled   She denies any fevers, chills, nausea, vomiting.  No chest pain, shortness of breath.  Objective: General: No acute distress, AAOx3  DP/PT pulses palpable 2/4, CRT < 3 sec to all digits.  Protective sensation intact. Motor function intact.  RIGHT foot: Cam boot intact.  Incision is a mild superficial dehiscence towards the distal portion of the incision.  There is no drainage or pus coming from the area.  There is no surrounding erythema, ascending cellulitis.  There is no fluctuation or crepitation there is no malodor.  Clinically she states that it is improving as well. Left foot is holding up well. There is no pain with calf compression, swelling, warmth, erythema in the calf is supple No other open lesions or pre-ulcerative lesions.   Assessment and Plan:  Status post right foot surgery  -Treatment options discussed including all alternatives, risks, and complications -X-rays were obtained and reviewed.  No evidence of acute fracture or breakage of the hardware.  Increased consolidation across the fusion sites. -I cleaned the distal portion of the incision today.  We will continue with antibiotic ointment dressing changes daily.  Change the bandage daily.  Monitoring signs or symptoms of infection.  I want her to hold off on putting too much weight on the foot until the incision is completely healed.  Return in about 2 weeks (around 05/22/2018).  Trula Slade DPM

## 2018-05-23 ENCOUNTER — Ambulatory Visit: Payer: BLUE CROSS/BLUE SHIELD | Admitting: Podiatry

## 2018-05-25 ENCOUNTER — Ambulatory Visit: Payer: BLUE CROSS/BLUE SHIELD | Admitting: Podiatry

## 2018-05-30 ENCOUNTER — Ambulatory Visit: Payer: BLUE CROSS/BLUE SHIELD | Admitting: Podiatry

## 2018-06-06 ENCOUNTER — Ambulatory Visit (INDEPENDENT_AMBULATORY_CARE_PROVIDER_SITE_OTHER): Payer: BLUE CROSS/BLUE SHIELD | Admitting: Podiatry

## 2018-06-06 DIAGNOSIS — Z981 Arthrodesis status: Secondary | ICD-10-CM

## 2018-06-06 DIAGNOSIS — R2681 Unsteadiness on feet: Secondary | ICD-10-CM

## 2018-06-06 DIAGNOSIS — M19071 Primary osteoarthritis, right ankle and foot: Secondary | ICD-10-CM

## 2018-06-15 ENCOUNTER — Encounter: Payer: Self-pay | Admitting: Neurology

## 2018-06-15 ENCOUNTER — Telehealth: Payer: Self-pay | Admitting: *Deleted

## 2018-06-15 DIAGNOSIS — Z981 Arthrodesis status: Secondary | ICD-10-CM

## 2018-06-15 DIAGNOSIS — G8929 Other chronic pain: Secondary | ICD-10-CM

## 2018-06-15 DIAGNOSIS — R2681 Unsteadiness on feet: Secondary | ICD-10-CM

## 2018-06-15 DIAGNOSIS — R7989 Other specified abnormal findings of blood chemistry: Secondary | ICD-10-CM

## 2018-06-15 DIAGNOSIS — M19071 Primary osteoarthritis, right ankle and foot: Secondary | ICD-10-CM

## 2018-06-15 NOTE — Telephone Encounter (Signed)
Referral, clinicals and demographics faxed to [facility] - Dr. Posey Pronto.

## 2018-06-15 NOTE — Progress Notes (Signed)
Subjective: [name] is a 58 y.o. is seen today in office s/p right medial column fusion preformed on 02/15/2018.  She states that she has been trying to put some weight on her foot she states that it does hurt some.  She says overall she is been about the same.  She still getting some swelling.  She has put weight on her foot without the boot.  She also has brought up today that she is been having some gait issues and she feels off balance.  This is been ongoing prior to the surgery.  She feels unbalanced been having some gait issues but again she relates this is ongoing for quite some time.  She is also been having a lot of burning to both of her feet.  Her pain management doctor did increase her Lyrica.  The burning is intermittent.  She has noticed her right big toenail becoming somewhat discolored but do not pain.  Pain management is controlling her pain medicine at this time. Her pain is controlled with the current regiment   She denies any fevers, chills, nausea, vomiting.  No chest pain, shortness of breath.  Objective: General: No acute distress, AAOx3 -presents alone DP/PT pulses palpable 2/4, CRT < 3 sec to all digits.  Protective sensation intact. Motor function intact.  RIGHT foot: Cam boot intact.  Incision appears to be healed.  A scab is present over the distal portion of the incision.  There is no drainage or pus identified there is no surrounding erythema, ascending cellulitis.  Is no fluctuation or crepitation any malodor.  There is tenderness palpation more along the talonavicular joint area.  There is no pain the lateral foot or ankle.  Mild swelling still remains.  The right hallux toenail has a white and yellow discoloration in the mildly hypertrophic.  There is no pain to the nail there is no redness or drainage or any swelling. Left foot is doing well. There is no pain with calf compression, swelling, warmth, erythema in the calf is supple No other open lesions or  pre-ulcerative lesions.   Assessment and Plan:  Status post right foot surgery  -Treatment options discussed including all alternatives, risks, and complications -Normal incision appears to be healed.  At this time we can slowly start to put some weight on her foot.  I do not want her to go from nonweightbearing just to walk into the boot or without the boot.  She must wear the boot at all times.  I want her to use a crutch or walker to help put some pressure on the foot gradually.  Continue to ice elevate as well as compression socks.  She has compression socks at home and I want her to wear them although she is hesitant to do so. -She declines physical therapy. -Given her gait instability and balance I do think that this is more from probably neuropathy.  I am also can have her be evaluated by neurology.  Trula Slade DPM

## 2018-06-15 NOTE — Telephone Encounter (Signed)
-----   Message from Trula Slade, DPM sent at 06/15/2018  8:20 AM EST ----- Can you please put in for a neurology consult to see Dr. Posey Pronto for gait/balance issues? This has been a chronic issue and I think it is due to neuropathy but I would like her checked. Thanks.

## 2018-06-27 ENCOUNTER — Ambulatory Visit (INDEPENDENT_AMBULATORY_CARE_PROVIDER_SITE_OTHER): Payer: BLUE CROSS/BLUE SHIELD

## 2018-06-27 ENCOUNTER — Ambulatory Visit (INDEPENDENT_AMBULATORY_CARE_PROVIDER_SITE_OTHER): Payer: BLUE CROSS/BLUE SHIELD | Admitting: Podiatry

## 2018-06-27 ENCOUNTER — Encounter: Payer: Self-pay | Admitting: Podiatry

## 2018-06-27 ENCOUNTER — Other Ambulatory Visit: Payer: Self-pay | Admitting: Podiatry

## 2018-06-27 DIAGNOSIS — Z981 Arthrodesis status: Secondary | ICD-10-CM | POA: Diagnosis not present

## 2018-06-27 DIAGNOSIS — M79671 Pain in right foot: Secondary | ICD-10-CM

## 2018-06-29 NOTE — Progress Notes (Signed)
Subjective: [name] is a 58 y.o. is seen today in office s/p right medial column fusion preformed on 02/15/2018.  She states that overall she is still having some discomfort.  She has been walking in the cam boot but she is still limited amount of walking she is doing.  She has not yet returned to work and she does not want to return to work at this time.  She does not she cannot go back to work because of the discomfort she gets.  She denies any recent injury or falls and she has no other concerns today.  She is awaiting consult from neurology given her balance issues and brought in for both feet/legs  Pain management is controlling her pain medicine at this time. Her pain is controlled with the current regiment  She denies any fevers, chills, nausea, vomiting.  No chest pain, shortness of breath.  Objective: General: No acute distress, AAOx3 -presents alone DP/PT pulses palpable 2/4, CRT < 3 sec to all digits.  Protective sensation intact. Motor function intact.  RIGHT foot: Cam boot intact.  The incision is well healed at this time with a scar over the area.  There is tenderness mostly on talonavicular joint.  There is no other area pinpoint bony tenderness.  Minimal discomfort along the distal portion the incision.  There is discomfort mildly along the sinus tarsi to the lateral aspect the foot as well as in the course the peroneal tendons.  She presents today walking in the cam boot. There is no pain with calf compression, swelling, warmth, erythema in the calf is supple No other open lesions or pre-ulcerative lesions.   Assessment and Plan:  Status post right foot surgery, with continued pain  -Treatment options discussed including all alternatives, risks, and complications -X-rays were obtained reviewed.  Significant hardware is intact with no evidence of breakage at this time to the new hardware.  Difficult to evaluate the arthrodesis site, I can tell there is increased  consolidation. -At this time will he remain in the cam boot and gradually increase activity level but wear the cam boot at all times.  Encouraged ice and elevation. -We will hold off returning back to work at this time.  I will see her back in 4 weeks and exam will reevaluate. -Continue ice elevate. -Pain management as directed by pain management -Awaiting neurology evaluation  Trula Slade DPM

## 2018-07-18 ENCOUNTER — Telehealth: Payer: Self-pay | Admitting: Podiatry

## 2018-07-18 NOTE — Telephone Encounter (Signed)
I spoke with pt, she states she is looking to apply for disability and would like to know how many screws were in her right foot, and would like her records. I told pt from the view I had of her x-rays she appeared to have about 13 fixators. Pt states she would like Dr. Jacqualyn Posey to know about the filing for disability.

## 2018-07-18 NOTE — Telephone Encounter (Signed)
I have a question for Valery. If you could have her call me back please.

## 2018-07-25 ENCOUNTER — Ambulatory Visit (INDEPENDENT_AMBULATORY_CARE_PROVIDER_SITE_OTHER): Payer: BLUE CROSS/BLUE SHIELD

## 2018-07-25 ENCOUNTER — Ambulatory Visit (INDEPENDENT_AMBULATORY_CARE_PROVIDER_SITE_OTHER): Payer: BLUE CROSS/BLUE SHIELD | Admitting: Podiatry

## 2018-07-25 DIAGNOSIS — M19071 Primary osteoarthritis, right ankle and foot: Secondary | ICD-10-CM

## 2018-07-25 DIAGNOSIS — M779 Enthesopathy, unspecified: Secondary | ICD-10-CM

## 2018-07-25 MED ORDER — TRIAMCINOLONE ACETONIDE 10 MG/ML IJ SUSP: 10.0000 mg | Freq: Once | INTRAMUSCULAR | Status: AC

## 2018-08-03 NOTE — Progress Notes (Signed)
Subjective: [name] is a 58 y.o. is seen today in office s/p right medial column fusion preformed on 02/15/2018.  She presents today walking in the cam boot.  She states that overall she feels about the same.  She is still experiencing pain she goes without the boot.  She points on the medial aspect of her foot.  She denies any recent injury or falls.  She states incision is well-healed and she denies any drainage or opening to the incision site.  Swelling is mildly improving.  She is awaiting consult from neurology given her balance issues and brought in for both feet/legs  Pain management is controlling her pain medicine at this time. Her pain is controlled with the current regiment  She denies any fevers, chills, nausea, vomiting.  No chest pain, shortness of breath.  Objective: General: No acute distress, AAOx3 -presents alone DP/PT pulses palpable 2/4, CRT < 3 sec to all digits.  Protective sensation intact. Motor function intact.  RIGHT foot: Cam boot intact.  Scar is well formed overlying the incision site there is no surrounding erythema or increase in warmth.  There is no clinical signs of infection noted today.  There is still tenderness palpation mostly limited to the navicular arthrodesis site.  There is discomfort along the sinus tarsi to the lateral aspect of the foot as well as minimal edema to this area.  There is no pain to the ankle joint itself. There is no pain with calf compression, swelling, warmth, erythema in the calf is supple No other open lesions or pre-ulcerative lesions.   Assessment and Plan:  Status post right foot surgery, with continued pain; sinus tarsi capsulitis  -Treatment options discussed including all alternatives, risks, and complications -X-rays obtained reviewed with her.  Hardware intact.  No evidence of breakage of the hardware is no recent surgery. -We had a long discussion regards to treatment options. I am going to check to see if we can get  her in [place] type brace to wear in order to try and get her out of the cam boot. -Steroid injection performed of the sinus tarsi today.  See procedure note below. -She is having multiple medical issues.  She is having a lot of issues still with her foot which is been ongoing issue.  She is to her feet started about 25 years ago and she went there.  Pain management before she came off of the medications.  5 years ago she started having increased left foot pain.  She was seen by other physicians for this and she states the pain became unbearable she has been doing something different.  That is when he started the surgeries to her feet.  Unfortunately she is not seeing significant improvement from the surgeries and she is getting a chronic foot pain.  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  Due to this I do support her claim for disability.  Also when she sits for periods of time she gets significant swelling to her legs.  She has been seen by the pain center previously as well they are considering stripping of the veins. -Continue ice elevate. -Pain management as directed by pain management -Awaiting neurology evaluation  Procedure: Injection Intermediate Joint Discussed alternatives, risks, complications and verbal consent was obtained.  Location: RIGHT sinus tarsi Skin Prep: Betadine Injectate: 0.5cc 0.5% marcaine plain, 0.5 cc 2% lidocaine plain and, 1 cc kenalog 10. Disposition: Patient  tolerated procedure well. Injection site dressed with a band-aid.  Post-injection care was discussed and return precautions discussed.    Trula Slade DPM

## 2018-08-04 ENCOUNTER — Telehealth: Payer: Self-pay | Admitting: Podiatry

## 2018-08-04 NOTE — Telephone Encounter (Signed)
Pt returned the call and left message on general mailbox..  I returned call and talked with pt and gave her benefit information for the brace. I asked her to schedule an appt with Liliane Channel when she is in the office on 4.7.2020 because at this time we are only scheduling urgent appts for the next week or so. I included it in the appt note.

## 2018-08-04 NOTE — Telephone Encounter (Signed)
Called pt to get scheduled for the brace per Dr Leigh Aurora request and was not able to leave a message her mailbox is full.

## 2018-08-21 ENCOUNTER — Telehealth: Payer: Self-pay | Admitting: *Deleted

## 2018-08-21 NOTE — Telephone Encounter (Signed)
Yes we can do a virtual visit 

## 2018-08-21 NOTE — Telephone Encounter (Signed)
Spoke to patient, scheduled a virtual visit with Dr. Jacqualyn Posey at same appointment time.

## 2018-08-21 NOTE — Telephone Encounter (Signed)
Pt states she would like to have a virtual appt with Dr. Jacqualyn Posey, since she is a high risk due to her immune system and he had wanted to discuss a brace, and she is still having the pain and swelling.

## 2018-08-22 ENCOUNTER — Telehealth (INDEPENDENT_AMBULATORY_CARE_PROVIDER_SITE_OTHER): Payer: BLUE CROSS/BLUE SHIELD | Admitting: Podiatry

## 2018-08-22 ENCOUNTER — Other Ambulatory Visit: Payer: Self-pay

## 2018-08-22 DIAGNOSIS — M19071 Primary osteoarthritis, right ankle and foot: Secondary | ICD-10-CM

## 2018-08-22 DIAGNOSIS — G8929 Other chronic pain: Secondary | ICD-10-CM

## 2018-08-22 DIAGNOSIS — M79673 Pain in unspecified foot: Secondary | ICD-10-CM

## 2018-08-22 DIAGNOSIS — Z981 Arthrodesis status: Secondary | ICD-10-CM

## 2018-08-22 MED ORDER — EFINACONAZOLE 10 % EX SOLN: 1.0000 [drp] | Freq: Every day | CUTANEOUS | 11 refills | Status: DC

## 2018-08-24 ENCOUNTER — Ambulatory Visit: Payer: BLUE CROSS/BLUE SHIELD | Admitting: Neurology

## 2018-08-28 NOTE — Progress Notes (Signed)
Virtual Visit via Telephone Note  I connected with [name] on 08/28/18 at  2:45 PM EDT by telephone and verified that I am speaking with the correct person using two identifiers.   I discussed the limitations, risks, security and privacy concerns of performing an evaluation and management service by telephone and the availability of in person appointments. I also discussed with the patient that there may be a patient responsible charge related to this service. The patient expressed understanding and agreed to proceed.   History of Present Illness: [name] previous underwent arthrodesis of the right foot.  Due to the recent pandemic she did not want to the office so therefore telephone visit was performed today.  From an incision standpoint incision is well-healed.  She does that she still having quite a bit of pain to her foot.  She states that today she is not wearing the boot she is wearing a regular shoe and is hurting.  Overall she states her symptoms are unchanged.  She is in the process of applying for disability.  She is still seeing pain management.    Observations/Objective: From what I could see the incision is well-healed with a scar.  There is no surrounding erythema or any red streaking.  There is no drainage.  There is actually decreased edema today that she reports but also she is not been very active given that she has not been able to do anything from having pain as well as the recent pandemic.  Overall she states that her symptoms are unchanged.  Assessment and Plan: Status post right foot arthrodesis with chronic pain At this time we discussed our treatment options.  Given the pain I would remain in the cam boot for offloading as it did seem to help some.  We had a long discussion regards to options.  We will try to get her [place] type brace or even a custom shoe.  I will discussed the case with Liliane Channel and I will have her follow-up with him for molding.  We asked her  disability I do support her disability claim.  Given the chronic foot pain she is unable to stand for extended period of time.  Also she has multiple other comorbidities.  We had a candid discussion regards to her pain.  She states that over the last 5 years even before the surgeries to her feet she was having quite a bit of increased pain to her feet.  We ended up doing surgery because she got to the point where she was not getting any relief and having significant pain to her feet.  Unfortunate the surgeries have not been overly successful and she still having significant pain to both of her feet with the right side worse than the left.   Follow Up Instructions:    I discussed the assessment and treatment plan with the patient. The patient was provided an opportunity to ask questions and all were answered. The patient agreed with the plan and demonstrated an understanding of the instructions.   The patient was advised to call back or seek an in-person evaluation if the symptoms worsen or if the condition fails to improve as anticipated.  I provided 12 minutes of non-face-to-face time during this encounter.   Trula Slade, DPM

## 2018-10-11 ENCOUNTER — Ambulatory Visit: Payer: BLUE CROSS/BLUE SHIELD | Admitting: Orthotics

## 2018-10-26 ENCOUNTER — Other Ambulatory Visit: Payer: Self-pay

## 2018-10-26 ENCOUNTER — Ambulatory Visit: Payer: BC Managed Care – PPO | Admitting: Orthotics

## 2018-10-26 DIAGNOSIS — Z981 Arthrodesis status: Secondary | ICD-10-CM

## 2018-10-26 DIAGNOSIS — M19071 Primary osteoarthritis, right ankle and foot: Secondary | ICD-10-CM

## 2018-10-26 NOTE — Progress Notes (Signed)
Unable to cast her due to severe edema lower extremity.

## 2018-11-03 NOTE — Progress Notes (Signed)
Virtual Visit via Video Note The purpose of this virtual visit is to provide medical care while limiting exposure to the novel coronavirus.    Consent was obtained for video visit:  Yes.   Answered questions that patient had about telehealth interaction:  Yes.   I discussed the limitations, risks, security and privacy concerns of performing an evaluation and management service by telemedicine. I also discussed with the patient that there may be a patient responsible charge related to this service. The patient expressed understanding and agreed to proceed.  Pt location: Home Physician Location: Home Name of referring provider:  Trula Slade, DPM I connected with [name] at patients initiation/request on 11/06/2018 at 11:10 AM EDT by video enabled telemedicine application and verified that I am speaking with the correct person using two identifiers. Pt MRN:  [id number] Pt DOB:  09/04/1960 Video Participants:  [name]   History of Present Illness:  [name] is a 58 year old woman who presents for neuropathy and unsteady gait.  History supplemented by referring provider notes.  She reports burning in her feet since a little over a year.  She also reports numbness and problems with balance.  She has fallen at least once in the past year.  However, she loses balance often. She does have osteoarthritis of both feet and has suffered from bilateral foot pain for many years.  She has had multiple surgeries in both feet.  She underwent right medial column fusion on 02/15/18.  She has been suffering from chronic foot pain since then.  She also has chronic low back pain.  She reports generalized weakness.  She does report numbness in the fingers as well.  She does have rheumatoid arthritis, OA and fibromyalgia, so she is unable to tell where the pain is originating.  She has hypothyroidism, which she states is adequately treated.  She also reports peripheral vascular disease.  No history of  diabetes.  Labs from 02/06/18 include unremarkable CBC and CMP   Past Medical History: Past Medical History:  Diagnosis Date   Acute pericarditis    a. 04/2014 Cath: nl cors, EF 65-70%;  b. 04/2014 Echo: EF 65-70%, mild TR.   Allergy    Anal fissure    Anemia    Anxiety    Bipolar 1 disorder (HCC)    Chronic lower back pain    Chronic pain    Fibromyalgia    GERD (gastroesophageal reflux disease)    HLD (hyperlipidemia)    Hypothyroidism    RA (rheumatoid arthritis) (House)    "all over"   Substance abuse (HCC)    Wrist fracture    right    Medications: Outpatient Encounter Medications as of 11/06/2018  Medication Sig   cephALEXin (KEFLEX) 500 MG capsule Take 1 capsule (500 mg total) by mouth 3 (three) times daily.   DULoxetine (CYMBALTA) 60 MG capsule Take 120 mg by mouth daily.   Efinaconazole 10 % SOLN Apply 1 drop topically daily.   enoxaparin (LOVENOX) 40 MG/0.4ML injection Inject 0.4 mLs (40 mg total) into the skin daily.   folic acid (FOLVITE) 782 MCG tablet Take 800 mcg by mouth daily.    furosemide (LASIX) 40 MG tablet Take 40 mg by mouth daily as needed for fluid.    HYDROmorphone (DILAUDID) 2 MG tablet Take 1 tablet (2 mg total) by mouth every 4 (four) hours as needed for severe pain.   ibuprofen (ADVIL,MOTRIN) 800 MG tablet Take 1 tablet (800 mg total)  by mouth every 8 (eight) hours as needed.   levothyroxine (SYNTHROID, LEVOTHROID) 50 MCG tablet Take 50 mcg by mouth daily before breakfast.   [EXPIRED] meloxicam (MOBIC) 7.5 MG tablet Take 1 tablet (7.5 mg total) by mouth daily.   Oxycodone HCl 20 MG TABS Take 1 tablet (20 mg total) by mouth 4 (four) times daily as needed. (Patient taking differently: Take 20 mg by mouth every 4 (four) hours. )   Phentermine-Topiramate (QSYMIA) 15-92 MG CP24 Take 1 capsule by mouth daily.   pregabalin (LYRICA) 200 MG capsule Take 200 mg by mouth 2 (two) times daily.   promethazine (PHENERGAN) 25 MG  tablet Take 25 mg by mouth every 8 (eight) hours as needed for nausea or vomiting.    promethazine (PHENERGAN) 25 MG tablet Take 1 tablet (25 mg total) by mouth every 8 (eight) hours as needed for nausea or vomiting.   risperiDONE (RISPERDAL) 3 MG tablet Take 3 mg by mouth daily.   traZODone (DESYREL) 100 MG tablet Take 200 mg by mouth at bedtime.   Vitamin D, Ergocalciferol, (DRISDOL) 50000 units CAPS capsule Take 1 capsule (50,000 Units total) by mouth every 7 (seven) days.   Facility-Administered Encounter Medications as of 11/06/2018  Medication   triamcinolone acetonide (KENALOG) 10 MG/ML injection 10 mg    Allergies: Allergies  Allergen Reactions   Elavil [Amitriptyline]     Increases heart rate    Family History: Family History  Problem Relation Age of Onset   Colon polyps Father    Colon polyps Sister    Heart attack Maternal Grandfather    Colon polyps Paternal Grandmother    Colon cancer Neg Hx     Social History: Social History   Socioeconomic History   Marital status: Divorced    Spouse name: Not on file   Number of children: 2   Years of education: Not on file   Highest education level: Not on file  Occupational History   Occupation: A/P  Scientist, product/process development strain: Not on file   Food insecurity    Worry: Not on file    Inability: Not on file   Transportation needs    Medical: Not on file    Non-medical: Not on file  Tobacco Use   Smoking status: Current Every Day Smoker    Packs/day: 1.00    Years: 34.00    Pack years: 34.00    Types: Cigarettes   Smokeless tobacco: Never Used  Substance and Sexual Activity   Alcohol use: No   Drug use: No   Sexual activity: Not Currently  Lifestyle   Physical activity    Days per week: Not on file    Minutes per session: Not on file   Stress: Not on file  Relationships   Social connections    Talks on phone: Not on file    Gets together: Not on file    Attends  religious service: Not on file    Active member of club or organization: Not on file    Attends meetings of clubs or organizations: Not on file    Relationship status: Not on file   Intimate partner violence    Fear of current or ex partner: Not on file    Emotionally abused: Not on file    Physically abused: Not on file    Forced sexual activity: Not on file  Other Topics Concern   Not on file  Social History Narrative   Not on  file   Observations/Objective:   Height 5\' 6"  (1.676 m), weight 246 lb (111.6 kg). No acute distress.  Alert and oriented.  Speech fluent and not dysarthric.  Language intact.    Assessment and Plan:   1.  Unsteady gait, neuralgia and paresthesias in feet.  She has history of multiple feet surgeries which may be contributing to her symptoms.  However, there is a question of possible underlying polyneuropathy.  She has RA, which may be possible contributor.  She has hypothyroidism but is controlled.  No history of diabetes.  1.  Will check NCV-EMG to evaluate for underlying sensorimotor polyneuropathy. 2.  Further recommendations pending results.  Follow Up Instructions:    -I discussed the assessment and treatment plan with the patient. The patient was provided an opportunity to ask questions and all were answered. The patient agreed with the plan and demonstrated an understanding of the instructions.   The patient was advised to call back or seek an in-person evaluation if the symptoms worsen or if the condition fails to improve as anticipated.     Dudley Major, DO

## 2018-11-06 ENCOUNTER — Telehealth (INDEPENDENT_AMBULATORY_CARE_PROVIDER_SITE_OTHER): Payer: BC Managed Care – PPO | Admitting: Neurology

## 2018-11-06 ENCOUNTER — Encounter: Payer: Self-pay | Admitting: Neurology

## 2018-11-06 ENCOUNTER — Other Ambulatory Visit: Payer: Self-pay

## 2018-11-06 VITALS — Ht 66.0 in | Wt 246.0 lb

## 2018-11-06 DIAGNOSIS — G6289 Other specified polyneuropathies: Secondary | ICD-10-CM | POA: Diagnosis not present

## 2018-11-06 NOTE — Addendum Note (Signed)
Addended by: Amado Coe on: 11/06/2018 12:54 PM   Modules accepted: Orders

## 2018-11-29 ENCOUNTER — Telehealth: Payer: Self-pay | Admitting: *Deleted

## 2018-11-29 NOTE — Telephone Encounter (Signed)
Ok thanks 

## 2018-11-29 NOTE — Telephone Encounter (Signed)
Pt called request to be referred to Dr. Mohammed Kindle [phone].

## 2018-11-29 NOTE — Telephone Encounter (Signed)
That is fine. I believe he is pain management.

## 2018-11-29 NOTE — Telephone Encounter (Signed)
I called pt and she states don't make the referral to Dr. Primus Bravo. I asked pt if there was anything I could do. Pt states she had her vein procedure on the right leg 11/24/2018 with [facility] and she can walk normal, she states she will see [place] Monday.

## 2018-12-04 ENCOUNTER — Encounter: Payer: Self-pay | Admitting: Podiatry

## 2018-12-04 ENCOUNTER — Ambulatory Visit (INDEPENDENT_AMBULATORY_CARE_PROVIDER_SITE_OTHER): Payer: BC Managed Care – PPO | Admitting: Podiatry

## 2018-12-04 ENCOUNTER — Other Ambulatory Visit: Payer: Self-pay

## 2018-12-04 VITALS — Temp 97.6°F

## 2018-12-04 DIAGNOSIS — G8929 Other chronic pain: Secondary | ICD-10-CM | POA: Diagnosis not present

## 2018-12-04 DIAGNOSIS — M19071 Primary osteoarthritis, right ankle and foot: Secondary | ICD-10-CM | POA: Diagnosis not present

## 2018-12-04 DIAGNOSIS — M79674 Pain in right toe(s): Secondary | ICD-10-CM | POA: Diagnosis not present

## 2018-12-04 DIAGNOSIS — M79675 Pain in left toe(s): Secondary | ICD-10-CM | POA: Diagnosis not present

## 2018-12-04 DIAGNOSIS — M79673 Pain in unspecified foot: Secondary | ICD-10-CM

## 2018-12-04 DIAGNOSIS — B351 Tinea unguium: Secondary | ICD-10-CM

## 2018-12-04 DIAGNOSIS — I872 Venous insufficiency (chronic) (peripheral): Secondary | ICD-10-CM | POA: Diagnosis not present

## 2018-12-05 NOTE — Progress Notes (Signed)
Subjective: 57 year old female presents the office today for follow-up evaluation of chronic bilateral foot pain on the right side worse than left.  She states that since I last saw her she has been going to the vein specialist and she is currently in the process of having pain treatment.  She still has about 3 injections left but she states already she has no significant provement of swelling on her right leg.  She is actually able to wear a more regular shoe which is been helpful.  She is also been walking better.  Her pain is also improved.  She also asking for her nails be trimmed today as she cannot do them herself. Denies any systemic complaints such as fevers, chills, nausea, vomiting. No acute changes since last appointment, and no other complaints at this time.   Objective: AAO x3, NAD DP/PT pulses palpable bilaterally, CRT less than 3 seconds There is still chronic swelling present bilaterally however the right lower extremity has significant improvement in the swelling that she has had since I last saw her.  There is no significant discomfort to the leg.  She still is on chronic pain on the foot itself but no specific area of tenderness.  She also is walking better upon gait evaluation.  Nails are hypertrophic, dystrophic, brittle, discolored, elongated 10. No surrounding redness or drainage. Tenderness nails 1-5 bilaterally. No open lesions or pre-ulcerative lesions are identified today. No open lesions or pre-ulcerative lesions.  No pain with calf compression, swelling, warmth, erythema  Assessment: Chronic bilateral lower extremity pain, venous insufficiency, symptomatic onychomycosis  Plan: -All treatment options discussed with the patient including all alternatives, risks, complications.  -Overall she is doing much better.  Continue with the treatments of the venous insufficiency.  Hopefully if the swelling improves she can get back to an regular shoe.  Once she finishes the  treatment with the vein specialist then will get measured for shoes.  Continue compression socks and elevation. -Nails debrided x10 without any complications or bleeding -Follow-up about 2 months or sooner if needed. -Patient encouraged to call the office with any questions, concerns, change in symptoms.   Trula Slade DPM

## 2018-12-12 ENCOUNTER — Encounter: Payer: BC Managed Care – PPO | Admitting: Neurology

## 2018-12-22 ENCOUNTER — Telehealth: Payer: Self-pay | Admitting: *Deleted

## 2018-12-22 DIAGNOSIS — M19071 Primary osteoarthritis, right ankle and foot: Secondary | ICD-10-CM

## 2018-12-22 DIAGNOSIS — M79673 Pain in unspecified foot: Secondary | ICD-10-CM

## 2018-12-22 DIAGNOSIS — G8929 Other chronic pain: Secondary | ICD-10-CM

## 2018-12-22 NOTE — Telephone Encounter (Signed)
Pt states she would like to be referred to Dr. Primus Bravo for pain management [phone].

## 2019-02-05 ENCOUNTER — Ambulatory Visit (INDEPENDENT_AMBULATORY_CARE_PROVIDER_SITE_OTHER): Payer: BC Managed Care – PPO | Admitting: Podiatry

## 2019-02-05 ENCOUNTER — Encounter: Payer: Self-pay | Admitting: Podiatry

## 2019-02-05 ENCOUNTER — Other Ambulatory Visit: Payer: Self-pay

## 2019-02-05 DIAGNOSIS — M19071 Primary osteoarthritis, right ankle and foot: Secondary | ICD-10-CM

## 2019-02-05 DIAGNOSIS — R609 Edema, unspecified: Secondary | ICD-10-CM | POA: Diagnosis not present

## 2019-02-05 DIAGNOSIS — M79675 Pain in left toe(s): Secondary | ICD-10-CM | POA: Diagnosis not present

## 2019-02-05 DIAGNOSIS — B351 Tinea unguium: Secondary | ICD-10-CM | POA: Diagnosis not present

## 2019-02-05 DIAGNOSIS — M79674 Pain in right toe(s): Secondary | ICD-10-CM

## 2019-02-05 DIAGNOSIS — G8929 Other chronic pain: Secondary | ICD-10-CM

## 2019-02-05 DIAGNOSIS — M79673 Pain in unspecified foot: Secondary | ICD-10-CM | POA: Diagnosis not present

## 2019-02-05 MED ORDER — PREDNISONE 10 MG PO TABS: ORAL_TABLET | ORAL | 0 refills | Status: DC

## 2019-02-12 NOTE — Progress Notes (Signed)
Subjective: 58 year old female presents the office today for concerns of elongated toenails that she cannot trim her self as well as for increased warmth of the right ankle.  Patient but she is still undergoing the venous treatments with [facility] vein specialist.  She has one more serious of injections.  She states that she is almost run to get more pain to the left foot and she points on the fifth metatarsal base.  She denies any recent injury.  She also wants to be fitted for the brace on the right side.Denies any systemic complaints such as fevers, chills, nausea, vomiting. No acute changes since last appointment, and no other complaints at this time.   Objective: AAO x3, NAD DP/PT pulses palpable bilaterally, CRT less than 3 seconds Prominence the fifth metatarsal base on the left foot with mild tenderness palpation of her starting to be a hyperkeratotic tissue lesion present.  On debridement ongoing ulceration, drainage.  Bilateral chronic foot pain is present there is no specific area of tenderness.  Overall the symptoms are severely improved compared to previously.  Significant flatfoot is present.  Nails are hypertrophic, dystrophic with yellow discoloration.  No redness or drainage.  No pain with calf compression, swelling, warmth, erythema  Assessment: Symptomatic onychomycosis, bilateral chronic foot pain, right swelling  Plan: -All treatment options discussed with the patient including all alternatives, risks, complications.  -Nails debrided x10 without any complications or bleeding -Continue with compression. She finished her last series of injections for the venous insufficiency on the right side.  She was good to be measured for the brace.  Prescribed a prednisone taper.  Her legs can help decrease some of the swelling as well to help with her foot pain.  When she finishes this I will have her follow-up with Liliane Channel for molding of the brace on the right side. -Patient encouraged to call  the office with any questions, concerns, change in symptoms.   Trula Slade DPM

## 2019-02-19 DIAGNOSIS — M25579 Pain in unspecified ankle and joints of unspecified foot: Secondary | ICD-10-CM | POA: Diagnosis not present

## 2019-02-19 DIAGNOSIS — G894 Chronic pain syndrome: Secondary | ICD-10-CM | POA: Diagnosis not present

## 2019-02-19 DIAGNOSIS — M545 Low back pain: Secondary | ICD-10-CM | POA: Diagnosis not present

## 2019-02-19 DIAGNOSIS — Z79891 Long term (current) use of opiate analgesic: Secondary | ICD-10-CM | POA: Diagnosis not present

## 2019-02-20 DIAGNOSIS — F4322 Adjustment disorder with anxiety: Secondary | ICD-10-CM | POA: Diagnosis not present

## 2019-02-27 DIAGNOSIS — F4322 Adjustment disorder with anxiety: Secondary | ICD-10-CM | POA: Diagnosis not present

## 2019-02-28 ENCOUNTER — Other Ambulatory Visit: Payer: Self-pay | Admitting: Obstetrics and Gynecology

## 2019-02-28 DIAGNOSIS — M810 Age-related osteoporosis without current pathological fracture: Secondary | ICD-10-CM

## 2019-02-28 DIAGNOSIS — Z13820 Encounter for screening for osteoporosis: Secondary | ICD-10-CM

## 2019-03-02 ENCOUNTER — Other Ambulatory Visit: Payer: BC Managed Care – PPO

## 2019-03-05 ENCOUNTER — Other Ambulatory Visit: Payer: Self-pay

## 2019-03-05 ENCOUNTER — Other Ambulatory Visit: Payer: BC Managed Care – PPO | Admitting: Orthotics

## 2019-03-06 DIAGNOSIS — F4322 Adjustment disorder with anxiety: Secondary | ICD-10-CM | POA: Diagnosis not present

## 2019-03-09 DIAGNOSIS — M5136 Other intervertebral disc degeneration, lumbar region: Secondary | ICD-10-CM | POA: Diagnosis not present

## 2019-03-09 DIAGNOSIS — M0579 Rheumatoid arthritis with rheumatoid factor of multiple sites without organ or systems involvement: Secondary | ICD-10-CM | POA: Diagnosis not present

## 2019-03-09 DIAGNOSIS — M5030 Other cervical disc degeneration, unspecified cervical region: Secondary | ICD-10-CM | POA: Diagnosis not present

## 2019-03-09 DIAGNOSIS — M150 Primary generalized (osteo)arthritis: Secondary | ICD-10-CM | POA: Diagnosis not present

## 2019-03-13 DIAGNOSIS — F4322 Adjustment disorder with anxiety: Secondary | ICD-10-CM | POA: Diagnosis not present

## 2019-03-19 DIAGNOSIS — M25579 Pain in unspecified ankle and joints of unspecified foot: Secondary | ICD-10-CM | POA: Diagnosis not present

## 2019-03-19 DIAGNOSIS — Z79891 Long term (current) use of opiate analgesic: Secondary | ICD-10-CM | POA: Diagnosis not present

## 2019-03-19 DIAGNOSIS — M545 Low back pain: Secondary | ICD-10-CM | POA: Diagnosis not present

## 2019-03-19 DIAGNOSIS — G894 Chronic pain syndrome: Secondary | ICD-10-CM | POA: Diagnosis not present

## 2019-03-20 DIAGNOSIS — F4322 Adjustment disorder with anxiety: Secondary | ICD-10-CM | POA: Diagnosis not present

## 2019-03-22 DIAGNOSIS — F4322 Adjustment disorder with anxiety: Secondary | ICD-10-CM | POA: Diagnosis not present

## 2019-03-26 DIAGNOSIS — M7582 Other shoulder lesions, left shoulder: Secondary | ICD-10-CM | POA: Diagnosis not present

## 2019-03-26 DIAGNOSIS — M542 Cervicalgia: Secondary | ICD-10-CM | POA: Diagnosis not present

## 2019-03-26 DIAGNOSIS — M7581 Other shoulder lesions, right shoulder: Secondary | ICD-10-CM | POA: Diagnosis not present

## 2019-03-27 DIAGNOSIS — R5383 Other fatigue: Secondary | ICD-10-CM | POA: Diagnosis not present

## 2019-03-27 DIAGNOSIS — F4322 Adjustment disorder with anxiety: Secondary | ICD-10-CM | POA: Diagnosis not present

## 2019-03-27 DIAGNOSIS — M0579 Rheumatoid arthritis with rheumatoid factor of multiple sites without organ or systems involvement: Secondary | ICD-10-CM | POA: Diagnosis not present

## 2019-04-03 DIAGNOSIS — F4322 Adjustment disorder with anxiety: Secondary | ICD-10-CM | POA: Diagnosis not present

## 2019-04-09 ENCOUNTER — Other Ambulatory Visit: Payer: BC Managed Care – PPO | Admitting: Orthotics

## 2019-04-09 ENCOUNTER — Ambulatory Visit (INDEPENDENT_AMBULATORY_CARE_PROVIDER_SITE_OTHER): Payer: BC Managed Care – PPO | Admitting: Podiatry

## 2019-04-09 ENCOUNTER — Other Ambulatory Visit: Payer: Self-pay

## 2019-04-09 DIAGNOSIS — I872 Venous insufficiency (chronic) (peripheral): Secondary | ICD-10-CM

## 2019-04-09 DIAGNOSIS — B351 Tinea unguium: Secondary | ICD-10-CM

## 2019-04-09 DIAGNOSIS — M19071 Primary osteoarthritis, right ankle and foot: Secondary | ICD-10-CM | POA: Diagnosis not present

## 2019-04-09 DIAGNOSIS — M79673 Pain in unspecified foot: Secondary | ICD-10-CM | POA: Diagnosis not present

## 2019-04-09 DIAGNOSIS — G8929 Other chronic pain: Secondary | ICD-10-CM

## 2019-04-16 NOTE — Progress Notes (Signed)
Subjective: 58 year old female presents the office today for concerns of elongated toenails that she cannot trim herself as well as her foot pain bilaterally.  She is to the left foot she feels that there is a "knot" coming back which is painful.  She also presents today to pick up a brace on the right side.  She still having 1 more treatment of the vein stripping on the right side.  She still has chronic swelling to the right side. Denies any systemic complaints such as fevers, chills, nausea, vomiting. No acute changes since last appointment, and no other complaints at this time.   She states that she does that she has neuropathy in her feet which we discussed previously.  Due to this.  She is having some balance issues and has difficulty walking.  She also describes that she is been having some memory issues which have been getting worse particularly the medications that she is on.  This is been ongoing for quite some time.  She is scheduled to see a psychiatrist.  She has previously seen neurology.  Due to her immobility, arthritis, fibromyalgia and with COVID she feels that she is getting depressed.  No suicidal ideations.   Objective: AAO x3, NAD DP/PT pulses palpable bilaterally, CRT less than 3 seconds Prominence the fifth metatarsal base on the left foot with mild tenderness palpation of her starting however there is no hyperkeratotic tissue today. Bilateral chronic foot pain is present there is no specific area of tenderness.  Overall the symptoms are severely improved compared to previously.  Significant flatfoot is present.  Nails are hypertrophic, dystrophic with yellow discoloration.  No redness or drainage.  No pain with calf compression, swelling, warmth, erythema  Assessment: Symptomatic onychomycosis, bilateral chronic foot pain  Plan: -All treatment options discussed with the patient including all alternatives, risks, complications.  -Nails debrided x10 without any complications or  bleeding -She was scheduled to have a brace on the right side today.  Upon trauma center is very uncomfortable.  A long discussion and we will get her fitted for custom shoes.  I think been more helpful. -Compound cream ordered for scar/antiinflammatory.  -Follow-up with psychiatry.  Also encouraged her to follow back up with neurology and her primary care physician. -Patient encouraged to call the office with any questions, concerns, change in symptoms.   Trula Slade DPM

## 2019-04-17 ENCOUNTER — Telehealth: Payer: Self-pay | Admitting: *Deleted

## 2019-04-17 DIAGNOSIS — M25579 Pain in unspecified ankle and joints of unspecified foot: Secondary | ICD-10-CM | POA: Diagnosis not present

## 2019-04-17 DIAGNOSIS — Z79891 Long term (current) use of opiate analgesic: Secondary | ICD-10-CM | POA: Diagnosis not present

## 2019-04-17 DIAGNOSIS — G894 Chronic pain syndrome: Secondary | ICD-10-CM | POA: Diagnosis not present

## 2019-04-17 DIAGNOSIS — M545 Low back pain: Secondary | ICD-10-CM | POA: Diagnosis not present

## 2019-04-17 MED ORDER — NONFORMULARY OR COMPOUNDED ITEM: 11 refills | Status: DC

## 2019-04-17 NOTE — Telephone Encounter (Signed)
Dr. Jacqualyn Posey order [facility] - Achilles Tendonitis cream. Orders faxd to [facility].

## 2019-04-19 ENCOUNTER — Other Ambulatory Visit: Payer: BC Managed Care – PPO | Admitting: Orthotics

## 2019-04-23 DIAGNOSIS — F3174 Bipolar disorder, in full remission, most recent episode manic: Secondary | ICD-10-CM | POA: Diagnosis not present

## 2019-04-23 DIAGNOSIS — F3132 Bipolar disorder, current episode depressed, moderate: Secondary | ICD-10-CM | POA: Diagnosis not present

## 2019-04-23 DIAGNOSIS — F4310 Post-traumatic stress disorder, unspecified: Secondary | ICD-10-CM | POA: Diagnosis not present

## 2019-04-23 DIAGNOSIS — F410 Panic disorder [episodic paroxysmal anxiety] without agoraphobia: Secondary | ICD-10-CM | POA: Diagnosis not present

## 2019-05-01 DIAGNOSIS — M170 Bilateral primary osteoarthritis of knee: Secondary | ICD-10-CM | POA: Diagnosis not present

## 2019-05-03 ENCOUNTER — Ambulatory Visit: Payer: BC Managed Care – PPO | Admitting: Orthotics

## 2019-05-03 ENCOUNTER — Other Ambulatory Visit: Payer: Self-pay

## 2019-05-03 ENCOUNTER — Ambulatory Visit (INDEPENDENT_AMBULATORY_CARE_PROVIDER_SITE_OTHER): Payer: BC Managed Care – PPO | Admitting: Podiatry

## 2019-05-03 DIAGNOSIS — M779 Enthesopathy, unspecified: Secondary | ICD-10-CM

## 2019-05-03 DIAGNOSIS — M7751 Other enthesopathy of right foot: Secondary | ICD-10-CM

## 2019-05-03 DIAGNOSIS — M19071 Primary osteoarthritis, right ankle and foot: Secondary | ICD-10-CM

## 2019-05-03 DIAGNOSIS — G8929 Other chronic pain: Secondary | ICD-10-CM

## 2019-05-03 DIAGNOSIS — I872 Venous insufficiency (chronic) (peripheral): Secondary | ICD-10-CM

## 2019-05-03 DIAGNOSIS — M79673 Pain in unspecified foot: Secondary | ICD-10-CM

## 2019-05-03 DIAGNOSIS — B351 Tinea unguium: Secondary | ICD-10-CM

## 2019-05-03 NOTE — Progress Notes (Signed)
Order [place], [id number]  Men's 9 10E RT and 9 6E LEFT.  Confirm [id number]

## 2019-05-06 NOTE — Progress Notes (Signed)
Subjective: 58 year old female presents the office today to pick up shoes, inserts.  While she was in the office she requested an injection and therefore I saw her for this today.  She denies any changes in also and she denies any swelling increased any recent injury.  She has pain to her left foot she points on the fifth metatarsal base plantarly where she gets majority of tenderness and this is where she like to proceed with the injection.  Denies any systemic complaints such as fevers, chills, nausea, vomiting. No acute changes since last appointment, and no other complaints at this time.   Objective: AAO x3, NAD DP/PT pulses palpable bilaterally, CRT less than 3 seconds Right foot is stable.  On the left foot there is tenderness on fifth metatarsal base plantarly.  There is hyperkeratotic tissue although mild to this area.  No pain on the course the peroneal tendon.  No other areas of pinpoint tenderness identified.  Flatfoot is present bilaterally. No open lesions or pre-ulcerative lesions.  No pain with calf compression, swelling, warmth, erythema  Assessment: Left foot pain requesting injection  Plan: -All treatment options discussed with the patient including all alternatives, risks, complications.  -A steroid injection was performed the left foot.  This area of tenderness was cleaned with alcohol and a mixture of 1 cc Kenalog 10, 0.5 cc of Marcaine plain, 0.5 cc of lidocaine plain was infiltrated on fifth metatarsal base plantarly and just distal.  Care was taken to avoid injecting into the peroneal tendon.  Prior to the injection we discussed risks complications of injection of this.  I do recommend her to return to cam boot for the next couple of days to help prevent rupture of the tendon.  She understands this. -She saw her today pick up shoes and she states they are very comfortable.  Also did further modify this and take pressure off of the fifth metatarsal base. -Patient encouraged to  call the office with any questions, concerns, change in symptoms.   Return in about 6 weeks (around 06/14/2019).  Trula Slade DPM

## 2019-05-11 ENCOUNTER — Emergency Department (HOSPITAL_COMMUNITY): Payer: BC Managed Care – PPO

## 2019-05-11 ENCOUNTER — Observation Stay (HOSPITAL_COMMUNITY)
Admission: EM | Admit: 2019-05-11 | Discharge: 2019-05-12 | Disposition: A | Discharge status: Home / Self Care | Payer: BC Managed Care – PPO | Attending: Student | Admitting: Student

## 2019-05-11 ENCOUNTER — Other Ambulatory Visit: Payer: Self-pay

## 2019-05-11 DIAGNOSIS — T40604A Poisoning by unspecified narcotics, undetermined, initial encounter: Secondary | ICD-10-CM

## 2019-05-11 DIAGNOSIS — L03115 Cellulitis of right lower limb: Secondary | ICD-10-CM | POA: Insufficient documentation

## 2019-05-11 DIAGNOSIS — R0989 Other specified symptoms and signs involving the circulatory and respiratory systems: Secondary | ICD-10-CM | POA: Diagnosis not present

## 2019-05-11 DIAGNOSIS — Z20828 Contact with and (suspected) exposure to other viral communicable diseases: Secondary | ICD-10-CM | POA: Insufficient documentation

## 2019-05-11 DIAGNOSIS — E1165 Type 2 diabetes mellitus with hyperglycemia: Secondary | ICD-10-CM | POA: Diagnosis not present

## 2019-05-11 DIAGNOSIS — T402X1A Poisoning by other opioids, accidental (unintentional), initial encounter: Secondary | ICD-10-CM | POA: Insufficient documentation

## 2019-05-11 DIAGNOSIS — F319 Bipolar disorder, unspecified: Secondary | ICD-10-CM | POA: Diagnosis not present

## 2019-05-11 DIAGNOSIS — M797 Fibromyalgia: Secondary | ICD-10-CM | POA: Insufficient documentation

## 2019-05-11 DIAGNOSIS — J9602 Acute respiratory failure with hypercapnia: Secondary | ICD-10-CM | POA: Insufficient documentation

## 2019-05-11 DIAGNOSIS — F419 Anxiety disorder, unspecified: Secondary | ICD-10-CM | POA: Diagnosis not present

## 2019-05-11 DIAGNOSIS — M545 Low back pain: Secondary | ICD-10-CM | POA: Insufficient documentation

## 2019-05-11 DIAGNOSIS — R404 Transient alteration of awareness: Secondary | ICD-10-CM | POA: Diagnosis not present

## 2019-05-11 DIAGNOSIS — Z79890 Hormone replacement therapy (postmenopausal): Secondary | ICD-10-CM | POA: Diagnosis not present

## 2019-05-11 DIAGNOSIS — T50901A Poisoning by unspecified drugs, medicaments and biological substances, accidental (unintentional), initial encounter: Secondary | ICD-10-CM | POA: Diagnosis not present

## 2019-05-11 DIAGNOSIS — G9340 Encephalopathy, unspecified: Principal | ICD-10-CM | POA: Diagnosis present

## 2019-05-11 DIAGNOSIS — Z79891 Long term (current) use of opiate analgesic: Secondary | ICD-10-CM | POA: Diagnosis not present

## 2019-05-11 DIAGNOSIS — Z9884 Bariatric surgery status: Secondary | ICD-10-CM | POA: Insufficient documentation

## 2019-05-11 DIAGNOSIS — Z79899 Other long term (current) drug therapy: Secondary | ICD-10-CM | POA: Insufficient documentation

## 2019-05-11 DIAGNOSIS — Z03818 Encounter for observation for suspected exposure to other biological agents ruled out: Secondary | ICD-10-CM | POA: Diagnosis not present

## 2019-05-11 DIAGNOSIS — G8929 Other chronic pain: Secondary | ICD-10-CM | POA: Diagnosis not present

## 2019-05-11 DIAGNOSIS — E039 Hypothyroidism, unspecified: Secondary | ICD-10-CM | POA: Insufficient documentation

## 2019-05-11 DIAGNOSIS — F1990 Other psychoactive substance use, unspecified, uncomplicated: Secondary | ICD-10-CM | POA: Diagnosis not present

## 2019-05-11 DIAGNOSIS — R4020 Unspecified coma: Secondary | ICD-10-CM | POA: Diagnosis not present

## 2019-05-11 DIAGNOSIS — M0579 Rheumatoid arthritis with rheumatoid factor of multiple sites without organ or systems involvement: Secondary | ICD-10-CM | POA: Insufficient documentation

## 2019-05-11 DIAGNOSIS — Z96619 Presence of unspecified artificial shoulder joint: Secondary | ICD-10-CM | POA: Insufficient documentation

## 2019-05-11 DIAGNOSIS — T50904A Poisoning by unspecified drugs, medicaments and biological substances, undetermined, initial encounter: Secondary | ICD-10-CM | POA: Diagnosis not present

## 2019-05-11 DIAGNOSIS — F17210 Nicotine dependence, cigarettes, uncomplicated: Secondary | ICD-10-CM | POA: Diagnosis not present

## 2019-05-11 LAB — URINALYSIS, ROUTINE W REFLEX MICROSCOPIC
Bilirubin Urine: NEGATIVE
Glucose, UA: 150 mg/dL — AB
Hgb urine dipstick: NEGATIVE
Ketones, ur: 5 mg/dL — AB
Leukocytes,Ua: NEGATIVE
Nitrite: NEGATIVE
Protein, ur: 30 mg/dL — AB
Specific Gravity, Urine: 1.02 (ref 1.005–1.030)
pH: 5 (ref 5.0–8.0)

## 2019-05-11 LAB — CBC WITH DIFFERENTIAL/PLATELET
Abs Immature Granulocytes: 0.17 10*3/uL — ABNORMAL HIGH (ref 0.00–0.07)
Basophils Absolute: 0.1 10*3/uL (ref 0.0–0.1)
Basophils Relative: 0 %
Eosinophils Absolute: 0 10*3/uL (ref 0.0–0.5)
Eosinophils Relative: 0 %
HCT: 41.2 % (ref 36.0–46.0)
Hemoglobin: 13 g/dL (ref 12.0–15.0)
Immature Granulocytes: 1 %
Lymphocytes Relative: 6 %
Lymphs Abs: 1 10*3/uL (ref 0.7–4.0)
MCH: 31.2 pg (ref 26.0–34.0)
MCHC: 31.6 g/dL (ref 30.0–36.0)
MCV: 98.8 fL (ref 80.0–100.0)
Monocytes Absolute: 1 10*3/uL (ref 0.1–1.0)
Monocytes Relative: 5 %
Neutro Abs: 16.2 10*3/uL — ABNORMAL HIGH (ref 1.7–7.7)
Neutrophils Relative %: 88 %
Platelets: 336 10*3/uL (ref 150–400)
RBC: 4.17 MIL/uL (ref 3.87–5.11)
RDW: 13.5 % (ref 11.5–15.5)
WBC: 18.5 10*3/uL — ABNORMAL HIGH (ref 4.0–10.5)
nRBC: 0 % (ref 0.0–0.2)

## 2019-05-11 LAB — BLOOD GAS, VENOUS
Acid-base deficit: 4.9 mmol/L — ABNORMAL HIGH (ref 0.0–2.0)
Bicarbonate: 25.8 mmol/L (ref 20.0–28.0)
O2 Saturation: 69.8 %
Patient temperature: 98.6
pCO2, Ven: 71.6 mmHg (ref 44.0–60.0)
pH, Ven: 7.182 — CL (ref 7.250–7.430)
pO2, Ven: 40.1 mmHg (ref 32.0–45.0)

## 2019-05-11 LAB — COMPREHENSIVE METABOLIC PANEL
ALT: 37 U/L (ref 0–44)
AST: 64 U/L — ABNORMAL HIGH (ref 15–41)
Albumin: 3.4 g/dL — ABNORMAL LOW (ref 3.5–5.0)
Alkaline Phosphatase: 71 U/L (ref 38–126)
Anion gap: 10 (ref 5–15)
BUN: 14 mg/dL (ref 6–20)
CO2: 25 mmol/L (ref 22–32)
Calcium: 8.5 mg/dL — ABNORMAL LOW (ref 8.9–10.3)
Chloride: 101 mmol/L (ref 98–111)
Creatinine, Ser: 1.27 mg/dL — ABNORMAL HIGH (ref 0.44–1.00)
GFR calc Af Amer: 55 mL/min — ABNORMAL LOW (ref >60)
GFR calc non Af Amer: 47 mL/min — ABNORMAL LOW (ref >60)
Glucose, Bld: 216 mg/dL — ABNORMAL HIGH (ref 70–99)
Potassium: 4.4 mmol/L (ref 3.5–5.1)
Sodium: 136 mmol/L (ref 135–145)
Total Bilirubin: 0.4 mg/dL (ref 0.3–1.2)
Total Protein: 6.5 g/dL (ref 6.5–8.1)

## 2019-05-11 LAB — RAPID URINE DRUG SCREEN, HOSP PERFORMED
Amphetamines: POSITIVE — AB
Barbiturates: NOT DETECTED
Benzodiazepines: POSITIVE — AB
Cocaine: POSITIVE — AB
Opiates: NOT DETECTED
Tetrahydrocannabinol: POSITIVE — AB

## 2019-05-11 LAB — ETHANOL: Alcohol, Ethyl (B): <10 mg/dL (ref <10)

## 2019-05-11 LAB — SALICYLATE LEVEL: Salicylate Lvl: <7 mg/dL — ABNORMAL LOW (ref 7.0–30.0)

## 2019-05-11 LAB — ACETAMINOPHEN LEVEL: Acetaminophen (Tylenol), Serum: <10 ug/mL — ABNORMAL LOW (ref 10–30)

## 2019-05-11 LAB — CBG MONITORING, ED: Glucose-Capillary: 223 mg/dL — ABNORMAL HIGH (ref 70–99)

## 2019-05-11 MED ORDER — SODIUM CHLORIDE 0.9 % IV BOLUS
1000.0000 mL | Freq: Once | INTRAVENOUS | Status: AC
  Administered 2019-05-11: 17:00:00 1000 mL via INTRAVENOUS

## 2019-05-11 MED ORDER — NALOXONE HCL 0.4 MG/ML IJ SOLN
0.4000 mg | Freq: Once | INTRAMUSCULAR | Status: AC
  Administered 2019-05-11: 19:00:00 0.4 mg via INTRAVENOUS
  Filled 2019-05-11: qty 1

## 2019-05-11 MED ORDER — NALOXONE HCL 4 MG/10ML IJ SOLN
0.2000 mg/h | INTRAVENOUS | Status: DC
  Administered 2019-05-11: 22:00:00 0.2 mg/h via INTRAVENOUS
  Filled 2019-05-11: qty 10

## 2019-05-11 MED ORDER — NALOXONE HCL 0.4 MG/ML IJ SOLN
0.2000 mg | Freq: Once | INTRAMUSCULAR | Status: AC
  Administered 2019-05-11: 18:00:00 0.2 mg via INTRAVENOUS
  Filled 2019-05-11: qty 1

## 2019-05-11 MED ORDER — NALOXONE HCL 0.4 MG/ML IJ SOLN
0.2000 mg | Freq: Once | INTRAMUSCULAR | Status: AC
  Administered 2019-05-11: 21:00:00 0.2 mg via INTRAVENOUS
  Filled 2019-05-11: qty 1

## 2019-05-11 NOTE — ED Triage Notes (Signed)
Patient was found unresponsive by a friend and a friend gave him 85 of narcan per nasal. Patient was bag for 20 min per ems. Patient is currently alert and oriented x 2.

## 2019-05-11 NOTE — ED Notes (Signed)
Call received from pt granddaughter Tyanna Weeda [phone] requesting rtn call from pt when available. RN advised. [employer]

## 2019-05-11 NOTE — ED Provider Notes (Signed)
[facility] DEPT Provider Note   CSN: [id number] Arrival date & time: 05/11/19  1634     History No chief complaint on file.   [name] is a 58 y.o. female.  The history is provided by the patient, medical records and the EMS personnel. No language interpreter was used.   [name] is a 58 y.o. female who presents to the Emergency Department complaining of overdose. She presents the emergency department by EMS for evaluation following an overdose. Level V caveat due to altered mental status. History is provided by EMS and the patient. EMS was called to a home, where five women live. They states that the home is in poor condition. They were called out for an overdose on pain medication. Prior to their arrival 12 mg of intranasal Narcan was administered. She did require about 15 minutes of assisted ventilations with bag valve mask. They reported sugar of 450. Patient has no current complaints but is not able to provide a full history. She does have a history of rheumatoid arthritis but states she is not currently on medications for it. She is on chronic pain medications. She denies any alcohol or street drug use. She denies any intent to harm herself.    Past Medical History:  Diagnosis Date  . Acute pericarditis    a. 04/2014 Cath: nl cors, EF 65-70%;  b. 04/2014 Echo: EF 65-70%, mild TR.  [facility] Kitchen Allergy   . Anal fissure   . Anemia   . Anxiety   . Bipolar 1 disorder ([place])   . Chronic lower back pain   . Chronic pain   . Fibromyalgia   . GERD (gastroesophageal reflux disease)   . HLD (hyperlipidemia)   . Hypothyroidism   . RA (rheumatoid arthritis) ([place])    "all over"  . Substance abuse ([place])   . Wrist fracture    right    Patient Active Problem List   Diagnosis Date Noted  . Rheumatoid arthritis involving multiple sites with positive rheumatoid factor ([place]) 05/21/2016  . Primary osteoarthritis of both hands 05/21/2016  . Primary  osteoarthritis of both feet 05/21/2016  . Primary osteoarthritis of both knees 05/21/2016  . Primary osteoarthritis of right shoulder 05/21/2016  . Acute pericarditis 04/23/2014  . Rheumatoid arthritis ([place]) 04/11/2012  . History of narcotic addiction ([place]) 03/31/2012  . Fibromyalgia 03/31/2012    Past Surgical History:  Procedure Laterality Date  . ABDOMINAL HYSTERECTOMY  03/2004  . BACK SURGERY    . [place]; 1999  . COLONOSCOPY    . FOOT SURGERY Right 01/10/2016  . GASTRIC BYPASS  2001  . LEFT HEART CATHETERIZATION WITH CORONARY ANGIOGRAM N/A 04/23/2014   Normal coronaries and LVF  . OPEN REDUCTION INTERNAL FIXATION (ORIF) DISTAL RADIAL FRACTURE Right 10/31/2017   Procedure: OPEN REDUCTION INTERNAL FIXATION (ORIF) DISTAL RADIAL FRACTURE;  Surgeon: Hiram Gash, MD;  Location: [facility];  Service: Orthopedics;  Laterality: Right;  . SHOULDER ARTHROSCOPY W/ ROTATOR CUFF REPAIR Right ~ 2011  . TARSAL METATARSAL FUSION WITH WEIL OSTEOTOMY Left 2013  . TONSILLECTOMY    . TOTAL SHOULDER ARTHROPLASTY       OB History   No obstetric history on file.     Family History  Problem Relation Age of Onset  . Colon polyps Father   . Colon polyps Sister   . Heart attack Maternal Grandfather   . Colon polyps Paternal Grandmother   . Colon cancer Neg Hx  Social History   Tobacco Use  . Smoking status: Current Every Day Smoker    Packs/day: 1.00    Years: 34.00    Pack years: 34.00    Types: Cigarettes  . Smokeless tobacco: Never Used  Substance Use Topics  . Alcohol use: No  . Drug use: No    Home Medications Prior to Admission medications   Medication Sig Start Date End Date Taking? Authorizing Provider  ALPRAZolam (XANAX) 0.5 MG tablet BRING MEDICATION TO THE OFFICE ON THE DAY OF PROCEDURE TO BE DISPENSED BY MEDICAL STAFF 11/22/18   [provider]  DULoxetine (CYMBALTA) 60 MG capsule Take 120 mg by mouth daily.    [provider]    Efinaconazole 10 % SOLN Apply 1 drop topically daily. 08/22/18   Trula Slade, DPM  furosemide (LASIX) 40 MG tablet Take 40 mg by mouth daily as needed for fluid.     [provider]  hydroxychloroquine (PLAQUENIL) 200 MG tablet Take 200 mg by mouth 2 (two) times daily. Patient unsure of dose    [provider]  ibuprofen (ADVIL,MOTRIN) 800 MG tablet Take 1 tablet (800 mg total) by mouth every 8 (eight) hours as needed. 02/16/18   Trula Slade, DPM  levothyroxine (SYNTHROID) 100 MCG tablet Take 100 mcg by mouth daily before breakfast.    [provider]  NONFORMULARY OR COMPOUNDED ITEM [facility]:  Achilles Tendonitis Cream - Diclofena 3%, Baclofen 2%, Bupivacaine 1%, Gabapentin 6%, Ibuprofen 3%, Pentoxifylline 3%. Apply 1-2 grams to affected area 3-4 times daily as needed for pain. 04/17/19   Trula Slade, DPM  Oxycodone HCl 20 MG TABS Take 1 tablet (20 mg total) by mouth 4 (four) times daily as needed. Patient taking differently: Take 20 mg by mouth every 4 (four) hours.  09/20/16   Trula Slade, DPM  Phentermine-Topiramate (QSYMIA) 15-92 MG CP24 Take 1 capsule by mouth daily.    [provider]  predniSONE (DELTASONE) 10 MG tablet 12 day taper dose 02/05/19   Trula Slade, DPM  pregabalin (LYRICA) 200 MG capsule Take 200 mg by mouth 2 (two) times daily.    [provider]  promethazine (PHENERGAN) 25 MG tablet Take 25 mg by mouth every 8 (eight) hours as needed for nausea or vomiting.     [provider]  risperiDONE (RISPERDAL) 3 MG tablet Take 3 mg by mouth daily.    [provider]  traZODone (DESYREL) 100 MG tablet Take 200 mg by mouth at bedtime.    [provider]    Allergies    Elavil [amitriptyline]  Review of Systems   Review of Systems  All other systems reviewed and are negative.   Physical Exam Updated Vital Signs There were no vitals taken for this visit.  Physical  Exam Vitals and nursing note reviewed.  Constitutional:      Appearance: She is well-developed.     Comments: Drowsy but arouses to verbal stimuli  HENT:     Head: Normocephalic and atraumatic.  Eyes:     Comments: Pupils midsized and reactive bilaterally  Cardiovascular:     Rate and Rhythm: Normal rate and regular rhythm.     Heart sounds: No murmur.  Pulmonary:     Effort: Pulmonary effort is normal. No respiratory distress.  Abdominal:     Palpations: Abdomen is soft.     Tenderness: There is no abdominal tenderness. There is no guarding or rebound.  Musculoskeletal:  General: No tenderness.     Comments: Non pitting edema to bilateral lower extremities  Skin:    General: Skin is warm and dry.  Neurological:     Comments: Confused. Disoriented to place in recent events. Weakly moves all extremities.  Psychiatric:        Behavior: Behavior normal.     ED Results / Procedures / Treatments   Labs (all labs ordered are listed, but only abnormal results are displayed) Labs Reviewed  COMPREHENSIVE METABOLIC PANEL  ETHANOL  ACETAMINOPHEN LEVEL  SALICYLATE LEVEL  CBC WITH DIFFERENTIAL/PLATELET  URINALYSIS, ROUTINE W REFLEX MICROSCOPIC  BLOOD GAS, VENOUS  RAPID URINE DRUG SCREEN, HOSP PERFORMED  CBG MONITORING, ED    EKG None  Radiology No results found.  Procedures Procedures (including critical care time) CRITICAL CARE Performed by: Quintella Reichert   Total critical care time: 45 minutes  Critical care time was exclusive of separately billable procedures and treating other patients.  Critical care was necessary to treat or prevent imminent or life-threatening deterioration.  Critical care was time spent personally by me on the following activities: development of treatment plan with patient and/or surrogate as well as nursing, discussions with consultants, evaluation of patient's response to treatment, examination of patient, obtaining history from  patient or surrogate, ordering and performing treatments and interventions, ordering and review of laboratory studies, ordering and review of radiographic studies, pulse oximetry and re-evaluation of patient's condition.  Medications Ordered in ED Medications  sodium chloride 0.9 % bolus 1,000 mL (has no administration in time range)    ED Course  I have reviewed the triage vital signs and the nursing notes.  Pertinent labs & imaging results that were available during my care of the patient were reviewed by me and considered in my medical decision making (see chart for details).    MDM Rules/Calculators/A&P                      Patient here for evaluation following overdose. She received 12 mg of intranasal Narcan prior to ED arrival. On ED arrival she is lethargic but arouses to verbal stimuli. She does have evidence of respiratory acidosis on VBG and was treated with Narcan and became more alert but still drowsy. She required multiple doses of Narcan and was placed in a Narcan drip. When she does awaken she states that she snorted heroin. She denies any SI or recent illnesses. She does have leukocytosis, likely secondary to opioid overdose. Critical care consulted for admission.  Final Clinical Impression(s) / ED Diagnoses Final diagnoses:  Narcotic overdose, undetermined intent, initial encounter [facility])    Rx / DC Orders ED Discharge Orders    None       Quintella Reichert, MD 05/12/19 [id number]

## 2019-05-11 NOTE — ED Notes (Signed)
Date and time results received: 05/11/19 1854 (use smartphrase ".now" to insert current time)  Test: PH, CO2 Critical Value:  7.182 71.6  Name of Provider Notified: Dr. Ayesha Rumpf  Orders Received? Or Actions Taken?: Orders Received - See Orders for details

## 2019-05-12 DIAGNOSIS — T40604A Poisoning by unspecified narcotics, undetermined, initial encounter: Secondary | ICD-10-CM

## 2019-05-12 DIAGNOSIS — G9349 Other encephalopathy: Secondary | ICD-10-CM | POA: Diagnosis not present

## 2019-05-12 DIAGNOSIS — J9602 Acute respiratory failure with hypercapnia: Secondary | ICD-10-CM | POA: Diagnosis not present

## 2019-05-12 DIAGNOSIS — G9340 Encephalopathy, unspecified: Secondary | ICD-10-CM | POA: Diagnosis present

## 2019-05-12 LAB — BLOOD GAS, ARTERIAL
Acid-Base Excess: 0.9 mmol/L (ref 0.0–2.0)
Bicarbonate: 26.5 mmol/L (ref 20.0–28.0)
Drawn by: 11249
O2 Saturation: 97.6 %
Patient temperature: 98.2
pCO2 arterial: 48.1 mmHg — ABNORMAL HIGH (ref 32.0–48.0)
pH, Arterial: 7.358 (ref 7.350–7.450)
pO2, Arterial: 96.5 mmHg (ref 83.0–108.0)

## 2019-05-12 LAB — SARS CORONAVIRUS 2 (TAT 6-24 HRS): SARS Coronavirus 2: NEGATIVE

## 2019-05-12 LAB — CBG MONITORING, ED: Glucose-Capillary: 65 mg/dL — ABNORMAL LOW (ref 70–99)

## 2019-05-12 LAB — HEMOGLOBIN A1C
Hgb A1c MFr Bld: 5 % (ref 4.8–5.6)
Mean Plasma Glucose: 96.8 mg/dL

## 2019-05-12 LAB — CREATININE, SERUM
Creatinine, Ser: 0.9 mg/dL (ref 0.44–1.00)
GFR calc Af Amer: >60 mL/min (ref >60)
GFR calc non Af Amer: >60 mL/min (ref >60)

## 2019-05-12 LAB — AMMONIA: Ammonia: <9 umol/L — ABNORMAL LOW (ref 9–35)

## 2019-05-12 LAB — T4, FREE: Free T4: 0.89 ng/dL (ref 0.61–1.12)

## 2019-05-12 LAB — HIV ANTIBODY (ROUTINE TESTING W REFLEX): HIV Screen 4th Generation wRfx: NONREACTIVE

## 2019-05-12 LAB — TSH: TSH: 2.641 u[IU]/mL (ref 0.350–4.500)

## 2019-05-12 LAB — BRAIN NATRIURETIC PEPTIDE: B Natriuretic Peptide: 51 pg/mL (ref 0.0–100.0)

## 2019-05-12 MED ORDER — INSULIN ASPART 100 UNIT/ML ~~LOC~~ SOLN
0.0000 [IU] | Freq: Four times a day (QID) | SUBCUTANEOUS | Status: DC
  Filled 2019-05-12: qty 0.15

## 2019-05-12 MED ORDER — AMOXICILLIN 500 MG PO CAPS
500.0000 mg | ORAL_CAPSULE | Freq: Three times a day (TID) | ORAL | Status: DC
  Administered 2019-05-12: 01:00:00 500 mg via ORAL
  Filled 2019-05-12: qty 1

## 2019-05-12 MED ORDER — ENOXAPARIN SODIUM 40 MG/0.4ML ~~LOC~~ SOLN: 40.0000 mg | SUBCUTANEOUS | Status: DC

## 2019-05-12 MED ORDER — DOXYCYCLINE HYCLATE 100 MG PO TABS
100.0000 mg | ORAL_TABLET | Freq: Two times a day (BID) | ORAL | Status: DC
  Administered 2019-05-12: 01:00:00 100 mg via ORAL
  Filled 2019-05-12: qty 1

## 2019-05-12 NOTE — ED Notes (Signed)
Pt states "My overdose was not intentional, I think it was suppose to be fentanyl I was taking, it was not on purpose" Patient then ambulated to the restroom, gait steady. Patient proceeded to say "If I can walk then I should be able to leave."

## 2019-05-12 NOTE — ED Notes (Signed)
Doctor Roxanne Mins and Critical Care has been contacted regarding patients AMA status.

## 2019-05-12 NOTE — ED Notes (Signed)
Attempted x 2 to obtain 0000 labs unsuccessfully.

## 2019-05-12 NOTE — H&P (Signed)
NAME:  [name], MRN:  [id number], DOB:  03-Jul-1960, LOS: 0 ADMISSION DATE:  05/11/2019, CONSULTATION DATE:  12/26 REFERRING MD:  Ralene Bathe, CHIEF COMPLAINT: somnolence  Brief History   58yF admitted with acute encephalopathy, acute hypercapnic respiratory failure in setting unintentional opioid overdose/polysubstance use requiring naloxone infusion  History of present illness   58yF with history of RA previously on plaquenil, BiPD on risperdal/trazodone, chronic benzodiazepine use, chronic opioid use who was BIBEMS found at home shared with 5 other individuals after she says she found 'a guy' and snorted what she thinks may have been fentanyl or another synthetic opioid today. She adamantly denies intentional overdose. She has no cough, dysuria, diarrhea, fever/chills preceding admission. SHe has had a red rash on RLE for unknown period of time and has had BLE swelling for quite some time, taking lasix for this.  She was given narcan in the field by EMS and briefly required BVM. While she was initially drowsy in ED she was responsive to pushes of low doses of narcan and was started on naloxone infusion. She is easily arousable and completely attentive at time of my evaluation in ED.  Past Medical History  RA BiPD Chronic opioid use Chronic benzodiazepine use Polysubstance use  Significant Hospital Events   12/26 admitted on narcan drip  Consults:  None  Procedures:  None  Significant Diagnostic Tests:  Urine drug screen with cocaine, benzos, amphetamines, THC, opioids not detected  Micro Data:    Antimicrobials:  Amoxicillin/doxy 12/26-  Interim history/subjective:  n/a  Objective   Blood pressure 114/74, pulse 66, resp. rate 10, SpO2 97 %.        Intake/Output Summary (Last 24 hours) at 05/12/2019 0048 Last data filed at 05/11/2019 1900 Gross per 24 hour  Intake 1000 ml  Output --  Net 1000 ml   There were no vitals filed for this visit.  Examination: General:  slightly drowsy but easily arousable, oriented x3 HENT: NCAT, dry MM Lungs: CTAB, normal work of breathing Cardiovascular: RRR, no murmur, no JVD  Abdomen: soft, nontender, normal bowel sounds Extremities: warm, well-perfused without cyanosis, 2+ BLE edema. RLE with erythema and warmth. Neuro: grossly nonfocal, follows commands  Resolved Hospital Problem list   n/a  Assessment & Plan:  # Acute encephalopathy in setting of polysubstance use: - naloxone infusion, shoot for minimum RR ~ 12, current rate seems acceptable - capnography - hold sedating medications for now - will need to discuss cautiously reintroducing at least low dose of a benzodiazepine in AM to prevent withdrawal - f/u TSH/ft4, ammonia, BNP  # Acute hypercapnic respiratory failure: likely in setting of above. Habitus suggests high risk for sleep disordered breathing. - mgmt of encephalopathy as above, including checking TSH/ft4, ammonia, BNP - check abg if she gets drowsier than she currently is   # RLE cellulitis: - amoxicillin/doxy  Best practice:  Diet: npo  Pain/Anxiety/Delirium protocol (if indicated): not indicated VAP protocol (if indicated): not indicated DVT prophylaxis: lovenox GI prophylaxis: not indicated Glucose control: SSI Mobility: bed level Code Status: Full Family Communication: Didn't want me to reach out to any other family Disposition: ICU  Labs   CBC: Recent Labs  Lab 05/11/19 1722  WBC 18.5*  NEUTROABS 16.2*  HGB 13.0  HCT 41.2  MCV 98.8  PLT 123456    Basic Metabolic Panel: Recent Labs  Lab 05/11/19 1722  NA 136  K 4.4  CL 101  CO2 25  GLUCOSE 216*  BUN 14  CREATININE  1.27*  CALCIUM 8.5*   GFR: CrCl cannot be calculated (Unknown ideal weight.). Recent Labs  Lab 05/11/19 1722  WBC 18.5*    Liver Function Tests: Recent Labs  Lab 05/11/19 1722  AST 64*  ALT 37  ALKPHOS 71  BILITOT 0.4  PROT 6.5  ALBUMIN 3.4*   No results for input(s): LIPASE, AMYLASE  in the last 168 hours. No results for input(s): AMMONIA in the last 168 hours.  ABG    Component Value Date/Time   HCO3 25.8 05/11/2019 1800   ACIDBASEDEF 4.9 (H) 05/11/2019 1800   O2SAT 69.8 05/11/2019 1800     Coagulation Profile: No results for input(s): INR, PROTIME in the last 168 hours.  Cardiac Enzymes: No results for input(s): CKTOTAL, CKMB, CKMBINDEX, TROPONINI in the last 168 hours.  HbA1C: Hemoglobin A1C  Date/Time Value Ref Range Status  11/25/2013 09:06 AM 4.9  Final    CBG: Recent Labs  Lab 05/11/19 1712  GLUCAP 223*    Review of Systems:   A twelve point review of systems is negative except as otherwise noted in HPI  Past Medical History  She,  has a past medical history of Acute pericarditis, Allergy, Anal fissure, Anemia, Anxiety, Bipolar 1 disorder (HCC), Chronic lower back pain, Chronic pain, Fibromyalgia, GERD (gastroesophageal reflux disease), HLD (hyperlipidemia), Hypothyroidism, RA (rheumatoid arthritis) ([place]), Substance abuse ([place]), and Wrist fracture.   Surgical History    Past Surgical History:  Procedure Laterality Date  . ABDOMINAL HYSTERECTOMY  03/2004  . BACK SURGERY    . [place]; 1999  . COLONOSCOPY    . FOOT SURGERY Right 01/10/2016  . GASTRIC BYPASS  2001  . LEFT HEART CATHETERIZATION WITH CORONARY ANGIOGRAM N/A 04/23/2014   Normal coronaries and LVF  . OPEN REDUCTION INTERNAL FIXATION (ORIF) DISTAL RADIAL FRACTURE Right 10/31/2017   Procedure: OPEN REDUCTION INTERNAL FIXATION (ORIF) DISTAL RADIAL FRACTURE;  Surgeon: Hiram Gash, MD;  Location: [facility];  Service: Orthopedics;  Laterality: Right;  . SHOULDER ARTHROSCOPY W/ ROTATOR CUFF REPAIR Right ~ 2011  . TARSAL METATARSAL FUSION WITH WEIL OSTEOTOMY Left 2013  . TONSILLECTOMY    . TOTAL SHOULDER ARTHROPLASTY       Social History   reports that she has been smoking cigarettes. She has a 34.00 pack-year smoking history. She has never used smokeless tobacco. She  reports that she does not drink alcohol or use drugs.   Family History   Her family history includes Colon polyps in her father, paternal grandmother, and sister; Heart attack in her maternal grandfather. There is no history of Colon cancer.   Allergies Allergies  Allergen Reactions  . Elavil [Amitriptyline]     Increases heart rate     Home Medications  Prior to Admission medications   Medication Sig Start Date End Date Taking? Authorizing Provider  ALPRAZolam (XANAX) 0.5 MG tablet BRING MEDICATION TO THE OFFICE ON THE DAY OF PROCEDURE TO BE DISPENSED BY MEDICAL STAFF 11/22/18   [provider]  DULoxetine (CYMBALTA) 60 MG capsule Take 120 mg by mouth daily.    [provider]  Efinaconazole 10 % SOLN Apply 1 drop topically daily. 08/22/18   Trula Slade, DPM  furosemide (LASIX) 40 MG tablet Take 40 mg by mouth daily as needed for fluid.     [provider]  hydroxychloroquine (PLAQUENIL) 200 MG tablet Take 200 mg by mouth 2 (two) times daily. Patient unsure of dose    [provider]  ibuprofen (  ADVIL,MOTRIN) 800 MG tablet Take 1 tablet (800 mg total) by mouth every 8 (eight) hours as needed. 02/16/18   Trula Slade, DPM  levothyroxine (SYNTHROID) 100 MCG tablet Take 100 mcg by mouth daily before breakfast.    [provider]  NONFORMULARY OR COMPOUNDED ITEM [facility]:  Achilles Tendonitis Cream - Diclofena 3%, Baclofen 2%, Bupivacaine 1%, Gabapentin 6%, Ibuprofen 3%, Pentoxifylline 3%. Apply 1-2 grams to affected area 3-4 times daily as needed for pain. 04/17/19   Trula Slade, DPM  Oxycodone HCl 20 MG TABS Take 1 tablet (20 mg total) by mouth 4 (four) times daily as needed. Patient taking differently: Take 20 mg by mouth every 4 (four) hours.  09/20/16   Trula Slade, DPM  Phentermine-Topiramate (QSYMIA) 15-92 MG CP24 Take 1 capsule by mouth daily.    [provider]  predniSONE (DELTASONE) 10 MG tablet 12  day taper dose 02/05/19   Trula Slade, DPM  pregabalin (LYRICA) 200 MG capsule Take 200 mg by mouth 2 (two) times daily.    [provider]  promethazine (PHENERGAN) 25 MG tablet Take 25 mg by mouth every 8 (eight) hours as needed for nausea or vomiting.     [provider]  risperiDONE (RISPERDAL) 3 MG tablet Take 3 mg by mouth daily.    [provider]  traZODone (DESYREL) 100 MG tablet Take 200 mg by mouth at bedtime.    [provider]     Critical care time: 30    This patient is critically ill with acute encephalopathy and acute hypercapnic respiratory failure; which, requires frequent high complexity decision making, assessment, support, evaluation, and titration of therapies. This was completed through the application of advanced monitoring technologies and extensive interpretation of multiple databases. During this encounter critical care time was devoted to patient care services described in this note for 30 minutes.  Walker Shadow PGY-5, Pulmonary/Critical Care

## 2019-05-12 NOTE — ED Notes (Signed)
Patient requesting cab. Number provided.

## 2019-05-12 NOTE — ED Notes (Signed)
Patient indicated she no longer wanted the IV medication. Pt stated "I'm not keeping my arm straight any longer, turn the machine off."

## 2019-05-12 NOTE — Progress Notes (Addendum)
ABG sent to lab at 0425. Lab called. Patient on room air.

## 2019-05-12 NOTE — ED Provider Notes (Signed)
Patient is desiring to leave.  She is still on a naloxone drip.  I have explained to her that if she leaves, when the naloxone wears off, she might stop breathing and suffer death or permanent brain injury.  Patient is willing to assume these risks.  She is awake and alert and clearly demonstrates understanding of what I am saying.  She tells me that she thinks that she overdosed on fentanyl.  I did explain to her that the time course of her need for naloxone was much greater than what would be expected from fentanyl overdose.  She still wishes to leave [place].     Delora Fuel, [place] [id number] [phone]

## 2019-05-12 NOTE — Discharge Instructions (Addendum)
You are leaving against medical advice. You overdosed on heroin last night. You are being given a steady infusion of naloxone to prevent the heroin from killing you. If you leave, when the naloxone is gone, the heroin will make you stop breathing again and you could die. Even worse, you could suffer permanent brain damage, and be successfully resuscitated, only to live with the permanent brain damage.

## 2019-05-14 DIAGNOSIS — M25579 Pain in unspecified ankle and joints of unspecified foot: Secondary | ICD-10-CM | POA: Diagnosis not present

## 2019-05-14 DIAGNOSIS — S5291XA Unspecified fracture of right forearm, initial encounter for closed fracture: Secondary | ICD-10-CM | POA: Diagnosis not present

## 2019-05-14 DIAGNOSIS — G894 Chronic pain syndrome: Secondary | ICD-10-CM | POA: Diagnosis not present

## 2019-05-14 DIAGNOSIS — Z79891 Long term (current) use of opiate analgesic: Secondary | ICD-10-CM | POA: Diagnosis not present

## 2019-05-14 DIAGNOSIS — M545 Low back pain: Secondary | ICD-10-CM | POA: Diagnosis not present

## 2019-05-16 ENCOUNTER — Ambulatory Visit (INDEPENDENT_AMBULATORY_CARE_PROVIDER_SITE_OTHER): Payer: Self-pay | Admitting: Cardiology

## 2019-05-16 ENCOUNTER — Encounter: Payer: Self-pay | Admitting: Cardiology

## 2019-05-16 ENCOUNTER — Other Ambulatory Visit: Payer: Self-pay

## 2019-05-16 VITALS — BP 112/75 | HR 94 | Temp 97.3°F | Ht 65.5 in | Wt 242.0 lb

## 2019-05-16 DIAGNOSIS — F1920 Other psychoactive substance dependence, uncomplicated: Secondary | ICD-10-CM

## 2019-05-16 DIAGNOSIS — I872 Venous insufficiency (chronic) (peripheral): Secondary | ICD-10-CM | POA: Diagnosis not present

## 2019-05-16 DIAGNOSIS — R0609 Other forms of dyspnea: Secondary | ICD-10-CM

## 2019-05-16 DIAGNOSIS — I83893 Varicose veins of bilateral lower extremities with other complications: Secondary | ICD-10-CM

## 2019-05-16 MED ORDER — SPIRONOLACTONE 50 MG PO TABS: 50.0000 mg | ORAL_TABLET | Freq: Every day | ORAL | 2 refills | Status: DC

## 2019-05-16 NOTE — Patient Instructions (Signed)
Please start taking Spironolactone every day and resume Furosemide daily.  Blood work in 10 days to 2 weeks at [facility].  I will see you in 4-6 weeks and will obtain an echocardiogram (Ultrasound of heart).

## 2019-05-16 NOTE — Progress Notes (Signed)
Primary Physician/Referring:  Geannie Risen, MD  Patient ID: [name], female    DOB: June 13, 1960, 58 y.o.   MRN: [id number]  Chief Complaint  Patient presents with  . New Patient (Initial Visit)  . Congestive Heart Failure   HPI:    [name]  is a 58 y.o. Caucasian female with bipolar disorder, polysubstance use, chronic pain syndrome, fibromyalgia, Rheumatoid arthritis, degenerative joint disease, last seen in the emergency room on 09/09/2018 with drug overdose and received Narcan and walked out [place].  She is now being consulted to evaluate for congestive heart failure. Past cardiac history includes history of idiopathic pericarditis in 2015.  She has had chronic bilateral leg edema, has had venous sclerotherapy bilateral lower extremity.  However recently she has noticed her right leg more than the left has been swelling.  No ulceration.  She has marked limitation in activity due to her underlying arthritis.  States that she is in constant pain and hence is dependent on pain medications.  Admits to using cocaine frequently.  Also smokes 1.5 packs of cigarettes a day.  Past Medical History:  Diagnosis Date  . Acute pericarditis    a. 04/2014 Cath: nl cors, EF 65-70%;  b. 04/2014 Echo: EF 65-70%, mild TR.  Mar[facility] Kitchen Allergy   . Anal fissure   . Anemia   . Anxiety   . Bipolar 1 disorder ([place])   . Chronic lower back pain   . Chronic pain   . Fibromyalgia   . GERD (gastroesophageal reflux disease)   . HLD (hyperlipidemia)   . Hypothyroidism   . RA (rheumatoid arthritis) ([place])    "all over"  . Substance abuse ([place])   . Wrist fracture    right   Past Surgical History:  Procedure Laterality Date  . ABDOMINAL HYSTERECTOMY  03/2004  . BACK SURGERY    . [place]; 1999  . COLONOSCOPY    . FOOT SURGERY Right 01/10/2016  . GASTRIC BYPASS  2001  . LEFT HEART CATHETERIZATION WITH CORONARY ANGIOGRAM N/A 04/23/2014   Normal coronaries and LVF  . OPEN  REDUCTION INTERNAL FIXATION (ORIF) DISTAL RADIAL FRACTURE Right 10/31/2017   Procedure: OPEN REDUCTION INTERNAL FIXATION (ORIF) DISTAL RADIAL FRACTURE;  Surgeon: Hiram Gash, MD;  Location: [place];  Service: Orthopedics;  Laterality: Right;  . SHOULDER ARTHROSCOPY W/ ROTATOR CUFF REPAIR Right ~ 2011  . TARSAL METATARSAL FUSION WITH WEIL OSTEOTOMY Left 2013  . TONSILLECTOMY    . TOTAL SHOULDER ARTHROPLASTY     Social History   Socioeconomic History  . Marital status: Divorced    Spouse name: Not on file  . Number of children: 2  . Years of education: Not on file  . Highest education level: Not on file  Occupational History  . Occupation: Unemployed  Tobacco Use  . Smoking status: Current Every Day Smoker    Packs/day: 1.00    Years: 34.00    Pack years: 34.00    Types: Cigarettes  . Smokeless tobacco: Never Used  Substance and Sexual Activity  . Alcohol use: No  . Drug use: No  . Sexual activity: Not Currently  Other Topics Concern  . Not on file  Social History Narrative   Right handed   Lives in two story home alone with a stair lift.   Trying to get disability   Social Determinants of Health   Financial Resource Strain:   . Difficulty of Paying Living Expenses: Not on file  Food Insecurity:   . Worried About Charity fundraiser in the Last Year: Not on file  . Ran Out of Food in the Last Year: Not on file  Transportation Needs:   . Lack of Transportation (Medical): Not on file  . Lack of Transportation (Non-Medical): Not on file  Physical Activity:   . Days of Exercise per Week: Not on file  . Minutes of Exercise per Session: Not on file  Stress:   . Feeling of Stress : Not on file  Social Connections:   . Frequency of Communication with Friends and Family: Not on file  . Frequency of Social Gatherings with Friends and Family: Not on file  . Attends Religious Services: Not on file  . Active Member of Clubs or Organizations: Not on file  . Attends Theatre manager Meetings: Not on file  . Marital Status: Not on file  Intimate Partner Violence:   . Fear of Current or Ex-Partner: Not on file  . Emotionally Abused: Not on file  . Physically Abused: Not on file  . Sexually Abused: Not on file   ROS  Review of Systems  Constitution: Positive for malaise/fatigue. Negative for chills, decreased appetite and weight gain.  Cardiovascular: Positive for dyspnea on exertion and leg swelling. Negative for orthopnea, palpitations, paroxysmal nocturnal dyspnea and syncope.  Endocrine: Negative for cold intolerance.  Hematologic/Lymphatic: Does not bruise/bleed easily.  Musculoskeletal: Positive for arthritis, back pain, joint pain, muscle cramps, myalgias and neck pain. Negative for joint swelling.  Gastrointestinal: Negative for abdominal pain, anorexia, change in bowel habit, hematochezia and melena.  Neurological: Negative for headaches and light-headedness.  Psychiatric/Behavioral: Negative for depression and substance abuse.  All other systems reviewed and are negative.  Objective  Blood pressure 112/75, pulse 94, temperature (!) 97.3 F (36.3 C), height 5' 5.5" (1.664 m), weight 242 lb (109.8 kg), SpO2 96 %. Body mass index is 39.66 kg/m.  Vitals with BMI 05/16/2019 05/12/2019 05/12/2019  Height 5' 5.5" - -  Weight 242 lbs - -  BMI [id number] - -  Systolic [id number]  Diastolic 75 70 65  Pulse 94 99 75     Physical Exam  Constitutional: No distress.  Moderately built and morbidly obese, appears much more older than stated age, appears ill-looking.  No acute distress.  HENT:  Head: Atraumatic.  Eyes: Conjunctivae are normal.  Neck: No thyromegaly present.  Short neck and difficult to evaluate JVP  Cardiovascular: Normal rate, regular rhythm, normal heart sounds and intact distal pulses. Exam reveals no gallop.  No murmur heard. Pulses:      Carotid pulses are 2+ on the right side and 2+ on the left side.      Dorsalis pedis pulses  are 2+ on the right side and 2+ on the left side.       Posterior tibial pulses are 2+ on the right side and 2+ on the left side.  Femoral and popliteal pulse difficult to feel due to patient's body habitus.  2-3+ right lower extremity edema and 2+ left lower extremity edema bilateral above-the-knee, pitting.  Pulmonary/Chest: Effort normal and breath sounds normal.  Abdominal: Soft. Bowel sounds are normal.  Obese. Pannus present  Musculoskeletal:     Cervical back: Neck supple.  Neurological: She is alert.  Skin: Skin is warm and dry.  Psychiatric: She has a normal mood and affect.   Laboratory examination:   Recent Labs    05/11/19 1722 05/12/19 0147  NA  136  --   K 4.4  --   CL 101  --   CO2 25  --   GLUCOSE 216*  --   BUN 14  --   CREATININE 1.27* 0.90  CALCIUM 8.5*  --   GFRNONAA 47* >60  GFRAA 55* >60   estimated creatinine clearance is 86.8 mL/min (by C-G formula based on SCr of 0.9 mg/dL).  CMP Latest Ref Rng & Units 05/12/2019 05/11/2019 02/06/2018  Glucose 70 - 99 mg/dL - 216(H) 88  BUN 6 - 20 mg/dL - 14 7  Creatinine 0.44 - 1.00 mg/dL 0.90 1.27(H) 0.85  Sodium 135 - 145 mmol/L - 136 138  Potassium 3.5 - 5.1 mmol/L - 4.4 4.3  Chloride 98 - 111 mmol/L - 101 106  CO2 22 - 32 mmol/L - 25 26  Calcium 8.9 - 10.3 mg/dL - 8.5(L) 9.5  Total Protein 6.5 - 8.1 g/dL - 6.5 6.0(L)  Total Bilirubin 0.3 - 1.2 mg/dL - 0.4 0.3  Alkaline Phos 38 - 126 U/L - 71 -  AST 15 - 41 U/L - 64(H) 12  ALT 0 - 44 U/L - 37 9   CBC Latest Ref Rng & Units 05/11/2019 02/06/2018 10/31/2017  WBC 4.0 - 10.5 K/uL 18.5(H) 7.1 6.2  Hemoglobin 12.0 - 15.0 g/dL 13.0 13.5 12.2  Hematocrit 36.0 - 46.0 % 41.2 39.4 37.7  Platelets 150 - 400 K/uL 336 302 288   Lipid Panel     Component Value Date/Time   CHOL 197 11/25/2013 0905   TRIG 87 11/25/2013 0905   HDL 61 11/25/2013 0905   CHOLHDL 3.2 11/25/2013 0905   VLDL 17 11/25/2013 0905   LDLCALC 119 (H) 11/25/2013 0905   HEMOGLOBIN A1C Lab  Results  Component Value Date   HGBA1C 5.0 05/12/2019   MPG 96.8 05/12/2019   TSH Recent Labs    05/12/19 0147  TSH 2.641     Ref Range & Units 4 d ago  B Natriuretic Peptide 0.0 - 100.0 pg/mL 51.0     Ref Range & Units 5 d ago 10 yr ago  Opiates NONE DETECTED NONE DETECTED  NONE DETECTED <epic:EPIC?REPORT&MR_REPORTS&11^R99#[id number],ORD,[id number]>   Cocaine NONE DETECTED POSITIVEAbnormal   NONE DETECTED <epic:EPIC?REPORT&MR_REPORTS&11^R99#[id number],ORD,[id number]>   Benzodiazepines NONE DETECTED POSITIVEAbnormal   POSITIVEAbnormal  <epic:EPIC?REPORT&MR_REPORTS&11^R99#[id number],ORD,[id number]>   Amphetamines NONE DETECTED POSITIVEAbnormal   NONE DETECTED <epic:EPIC?REPORT&MR_REPORTS&11^R99#[id number],ORD,[id number]>   Tetrahydrocannabinol NONE DETECTED POSITIVEAbnormal   NONE DETECTED <epic:EPIC?REPORT&MR_REPORTS&11^R99#[id number],ORD,[id number]>   Barbiturates NONE DETECTED     Medications and allergies   Allergies  Allergen Reactions  . Elavil [Amitriptyline]     Increases heart rate     Current Outpatient Medications  Medication Instructions  . albuterol (VENTOLIN HFA) 108 (90 Base) MCG/ACT inhaler 2 puffs, Inhalation, Every 4 hours PRN  . ALPRAZolam (XANAX) 0.5 mg, Oral, 2 times daily  . ARIPiprazole (ABILIFY) 2 mg, Oral, Daily at bedtime  . diclofenac (VOLTAREN) 50 mg, Oral, 2 times daily PRN  . DULoxetine (CYMBALTA) 120 mg, Oral, Daily  . Efinaconazole 10 % SOLN 1 drop, Apply externally, Daily  . furosemide (LASIX) 40 mg, Oral, Daily PRN  . levocetirizine (XYZAL) 5 mg, Oral, Daily  . levothyroxine (SYNTHROID) 137 MCG tablet Oral, Daily before breakfast  . NONFORMULARY OR COMPOUNDED ITEM [facility]:  Achilles Tendonitis Cream - Diclofena 3%, Baclofen 2%, Bupivacaine 1%, Gabapentin 6%, Ibuprofen 3%, Pentoxifylline 3%. Apply 1-2 grams to affected area 3-4 times daily as needed for pain.  . Oxycodone HCl 20 mg, Oral, 4 times daily PRN  .  pregabalin (LYRICA) 200 mg, 2 times daily  . risperiDONE (RISPERDAL) 3 MG tablet Oral, Daily, 1/2 tab qd  . spironolactone (ALDACTONE) 50 mg, Oral, Daily  . traZODone (DESYREL) 100 mg, Oral, Daily at bedtime    Radiology:  No results found.  Cardiac Studies:   Echo in 2015: Hyperdynamic LVEF  Assessment     ICD-10-CM   1. Dyspnea on exertion  R06.00 PCV ECHOCARDIOGRAM COMPLETE  2. Polysubstance (excluding opioids) dependence (HCC)  F19.20   3. Venous insufficiency of both lower extremities  [id number] Basic Metabolic Panel (BMET)  4. Varicose veins of leg with edema, bilateral  [id number] spironolactone (ALDACTONE) 50 MG tablet    EKG 05/11/2019: Normal sinus rhythm at rate of 77 bpm, normal axis.  Early repolarization.  No evidence of ischemia, normal EKG.    Recommendations:   Meds ordered this encounter  Medications  . spironolactone (ALDACTONE) 50 MG tablet    Sig: Take 1 tablet (50 mg total) by mouth daily.    Dispense:  30 tablet    Refill:  2    [name]  is a 58 y.o. Caucasian female with bipolar disorder, polysubstance use, chronic pain syndrome, fibromyalgia, Rheumatoid arthritis, degenerative joint disease, last seen in the emergency room on 09/09/2018 with drug overdose and received Narcan and walked out [place].  She is now being consulted to evaluate for congestive heart failure.   Her BNP was completely normal, cardiac examination is normal without any gallop or murmur, no JVD, hence I do not suspect congestive heart failure.  Her leg edema is related to lack of mobility, dependent edema and also she has varicose veins and has history of vein stripping/sclerotherapy in the past.  She may need to revisit this.  Advised her that she should wear support stockings, prescription for the same was given.  We discussed regarding wearing the support stockings early in the morning as she wakes up.  Also discussed regarding increasing her physical activity.  I have  prescribed her spironolactone 50 mg daily, she'll continue to use furosemide on a p.r.n. basis.  She will need BMP in 2 weeks.  With regard to dyspnea, related to deconditioning and morbid obesity.  Will obtain an echocardiogram.  She does not need ischemic workup in view of abnormal EKG and normal cardiac exam unless echocardiogram reveals any abnormality.  She had marked hyperglycemia noted on the BMP but however A1c was normal.  Urine sugar was also elevated.  I do not know the significance of the same. I have reviewed her records from hospital. "Total time spent with patient was 62 minutes and greater than 50% of that time was spent in counseling and coordination care with the patient especially regarding substance use and pain management".     Adrian Prows, MD, [facility] 05/16/2019, 6:29 PM [facility]. [place] Office: [phone]

## 2019-05-21 ENCOUNTER — Other Ambulatory Visit: Payer: Self-pay

## 2019-05-21 ENCOUNTER — Ambulatory Visit: Payer: BC Managed Care – PPO | Admitting: Podiatry

## 2019-05-21 ENCOUNTER — Ambulatory Visit (INDEPENDENT_AMBULATORY_CARE_PROVIDER_SITE_OTHER): Payer: BC Managed Care – PPO

## 2019-05-21 DIAGNOSIS — R0609 Other forms of dyspnea: Secondary | ICD-10-CM | POA: Diagnosis not present

## 2019-05-22 ENCOUNTER — Ambulatory Visit: Payer: BC Managed Care – PPO | Admitting: Podiatry

## 2019-05-23 ENCOUNTER — Telehealth: Payer: Self-pay

## 2019-05-25 NOTE — Telephone Encounter (Signed)
2nd attemp to call pt re: current insurance plan for 2021. Voice Mail still full. Unable to [employer].

## 2019-05-28 DIAGNOSIS — S5291XD Unspecified fracture of right forearm, subsequent encounter for closed fracture with routine healing: Secondary | ICD-10-CM | POA: Diagnosis not present

## 2019-06-04 DIAGNOSIS — F3111 Bipolar disorder, current episode manic without psychotic features, mild: Secondary | ICD-10-CM | POA: Diagnosis not present

## 2019-06-04 DIAGNOSIS — F3131 Bipolar disorder, current episode depressed, mild: Secondary | ICD-10-CM | POA: Diagnosis not present

## 2019-06-07 DIAGNOSIS — S5291XD Unspecified fracture of right forearm, subsequent encounter for closed fracture with routine healing: Secondary | ICD-10-CM | POA: Diagnosis not present

## 2019-06-12 DIAGNOSIS — M25531 Pain in right wrist: Secondary | ICD-10-CM | POA: Diagnosis not present

## 2019-06-12 DIAGNOSIS — S52691D Other fracture of lower end of right ulna, subsequent encounter for closed fracture with routine healing: Secondary | ICD-10-CM | POA: Diagnosis not present

## 2019-06-14 ENCOUNTER — Ambulatory Visit: Payer: BC Managed Care – PPO | Admitting: Podiatry

## 2019-06-25 ENCOUNTER — Ambulatory Visit: Payer: BC Managed Care – PPO | Admitting: Cardiology

## 2019-07-17 ENCOUNTER — Ambulatory Visit: Payer: BC Managed Care – PPO | Admitting: Podiatry

## 2019-07-20 ENCOUNTER — Other Ambulatory Visit: Payer: Self-pay

## 2019-07-20 ENCOUNTER — Emergency Department (HOSPITAL_BASED_OUTPATIENT_CLINIC_OR_DEPARTMENT_OTHER)
Admission: EM | Admit: 2019-07-20 | Discharge: 2019-07-21 | Disposition: A | Discharge status: Home / Self Care | Payer: BC Managed Care – PPO | Attending: Emergency Medicine | Admitting: Emergency Medicine

## 2019-07-20 ENCOUNTER — Encounter (HOSPITAL_BASED_OUTPATIENT_CLINIC_OR_DEPARTMENT_OTHER): Payer: Self-pay | Admitting: Emergency Medicine

## 2019-07-20 DIAGNOSIS — Z79899 Other long term (current) drug therapy: Secondary | ICD-10-CM | POA: Insufficient documentation

## 2019-07-20 DIAGNOSIS — F17210 Nicotine dependence, cigarettes, uncomplicated: Secondary | ICD-10-CM | POA: Diagnosis not present

## 2019-07-20 DIAGNOSIS — Z96619 Presence of unspecified artificial shoulder joint: Secondary | ICD-10-CM | POA: Insufficient documentation

## 2019-07-20 DIAGNOSIS — R0602 Shortness of breath: Secondary | ICD-10-CM | POA: Insufficient documentation

## 2019-07-20 DIAGNOSIS — R609 Edema, unspecified: Secondary | ICD-10-CM

## 2019-07-20 DIAGNOSIS — E039 Hypothyroidism, unspecified: Secondary | ICD-10-CM | POA: Insufficient documentation

## 2019-07-20 DIAGNOSIS — R600 Localized edema: Secondary | ICD-10-CM | POA: Diagnosis not present

## 2019-07-20 NOTE — ED Provider Notes (Signed)
[facility] EMERGENCY DEPARTMENT Provider Note   CSN: [id number] Arrival date & time: 07/20/19  2310     History Chief Complaint  Patient presents with  . Leg Swelling  . Shortness of Breath    [name] is a 59 y.o. female.  Patient presents to the emergency department for evaluation of leg swelling.  Patient reports that she had surgery on her foot approximately a year ago and has been experiencing swelling ever since then.  Over the last couple of days, however, the swelling has worsened.  She has started to notice some weeping from the skin on the right lower leg for the last 2 days.  She now feels "slightly short of breath".        Past Medical History:  Diagnosis Date  . Acute pericarditis    a. 04/2014 Cath: nl cors, EF 65-70%;  b. 04/2014 Echo: EF 65-70%, mild TR.  Marland Kitchen Allergy   . Anal fissure   . Anemia   . Anxiety   . Bipolar 1 disorder ([place])   . Chronic lower back pain   . Chronic pain   . Fibromyalgia   . GERD (gastroesophageal reflux disease)   . HLD (hyperlipidemia)   . Hypothyroidism   . RA (rheumatoid arthritis) ([place])    "all over"  . Substance abuse ([place])   . Wrist fracture    right    Patient Active Problem List   Diagnosis Date Noted  . Acute encephalopathy 05/12/2019  . Rheumatoid arthritis involving multiple sites with positive rheumatoid factor ([place]) 05/21/2016  . Primary osteoarthritis of both hands 05/21/2016  . Primary osteoarthritis of both feet 05/21/2016  . Primary osteoarthritis of both knees 05/21/2016  . Primary osteoarthritis of right shoulder 05/21/2016  . Acute pericarditis 04/23/2014  . Rheumatoid arthritis ([place]) 04/11/2012  . History of narcotic addiction ([place]) 03/31/2012  . Fibromyalgia 03/31/2012    Past Surgical History:  Procedure Laterality Date  . ABDOMINAL HYSTERECTOMY  03/2004  . BACK SURGERY    . [place]; 1999  . COLONOSCOPY    . FOOT SURGERY Right 01/10/2016  . GASTRIC BYPASS  2001   . LEFT HEART CATHETERIZATION WITH CORONARY ANGIOGRAM N/A 04/23/2014   Normal coronaries and LVF  . OPEN REDUCTION INTERNAL FIXATION (ORIF) DISTAL RADIAL FRACTURE Right 10/31/2017   Procedure: OPEN REDUCTION INTERNAL FIXATION (ORIF) DISTAL RADIAL FRACTURE;  Surgeon: Hiram Gash, MD;  Location: [facility];  Service: Orthopedics;  Laterality: Right;  . SHOULDER ARTHROSCOPY W/ ROTATOR CUFF REPAIR Right ~ 2011  . TARSAL METATARSAL FUSION WITH WEIL OSTEOTOMY Left 2013  . TONSILLECTOMY    . TOTAL SHOULDER ARTHROPLASTY       OB History   No obstetric history on file.     Family History  Problem Relation Age of Onset  . Thyroid disease Mother   . Colon polyps Father   . Colon polyps Sister   . Heart attack Maternal Grandfather   . Colon polyps Paternal Grandmother   . Colon cancer Neg Hx     Social History   Tobacco Use  . Smoking status: Current Every Day Smoker    Packs/day: 1.00    Years: 34.00    Pack years: 34.00    Types: Cigarettes  . Smokeless tobacco: Never Used  Substance Use Topics  . Alcohol use: No  . Drug use: No    Home Medications Prior to Admission medications   Medication Sig Start Date End Date Taking? Authorizing  Provider  ARIPiprazole (ABILIFY) 10 MG tablet Take 10 mg by mouth daily.   Yes [provider]  albuterol (VENTOLIN HFA) 108 (90 Base) MCG/ACT inhaler Inhale 2 puffs into the lungs every 4 (four) hours as needed. 02/22/19   [provider]  ALPRAZolam Duanne Moron) 0.5 MG tablet Take 0.5 mg by mouth 2 (two) times daily.  11/22/18   [provider]  ARIPiprazole (ABILIFY) 2 MG tablet Take 2 mg by mouth at bedtime. 05/05/19   [provider]  diclofenac (VOLTAREN) 50 MG EC tablet Take 50 mg by mouth 2 (two) times daily as needed. 01/25/19   [provider]  doxycycline (VIBRAMYCIN) 100 MG capsule Take 1 capsule (100 mg total) by mouth 2 (two) times daily. 07/21/19   Orpah Greek, MD  DULoxetine (CYMBALTA) 60 MG  capsule Take 120 mg by mouth daily.    [provider]  DULoxetine HCl (CYMBALTA PO) Cymbalta    [provider]  Efinaconazole 10 % SOLN Apply 1 drop topically daily. 08/22/18   Trula Slade, DPM  furosemide (LASIX) 40 MG tablet Take 40 mg by mouth daily as needed for fluid.     [provider]  levocetirizine (XYZAL) 5 MG tablet Take 5 mg by mouth daily. 01/26/19   [provider]  levothyroxine (SYNTHROID) 137 MCG tablet Take by mouth daily before breakfast.     [provider]  NONFORMULARY OR COMPOUNDED ITEM [facility]:  Achilles Tendonitis Cream - Diclofena 3%, Baclofen 2%, Bupivacaine 1%, Gabapentin 6%, Ibuprofen 3%, Pentoxifylline 3%. Apply 1-2 grams to affected area 3-4 times daily as needed for pain. 04/17/19   Trula Slade, DPM  Oxycodone HCl 20 MG TABS Take 1 tablet (20 mg total) by mouth 4 (four) times daily as needed. Patient taking differently: Take 20 mg by mouth every 6 (six) hours.  09/20/16   Trula Slade, DPM  pregabalin (LYRICA) 200 MG capsule Take 200 mg by mouth 2 (two) times daily.    [provider]  risperiDONE (RISPERDAL) 3 MG tablet Take by mouth daily. 1/2 tab qd    [provider]  spironolactone (ALDACTONE) 50 MG tablet Take 1 tablet (50 mg total) by mouth daily. 05/16/19 08/14/19  Adrian Prows, MD  traZODone (DESYREL) 100 MG tablet Take 100 mg by mouth at bedtime.     [provider]    Allergies    Elavil [amitriptyline]  Review of Systems   Review of Systems  Respiratory: Positive for shortness of breath.   Cardiovascular: Positive for leg swelling.  All other systems reviewed and are negative.   Physical Exam Updated Vital Signs BP 109/68 (BP Location: Right Arm)   Pulse 84   Temp 97.8 F (36.6 C) (Oral)   Resp 20   Ht 5\' 5"  (1.651 m)   Wt 108.9 kg   SpO2 96%   BMI 39.94 kg/m   Physical Exam Vitals and nursing note reviewed.  Constitutional:       General: She is not in acute distress.    Appearance: Normal appearance. She is well-developed.  HENT:     Head: Normocephalic and atraumatic.     Right Ear: Hearing normal.     Left Ear: Hearing normal.     Nose: Nose normal.  Eyes:     Conjunctiva/sclera: Conjunctivae normal.     Pupils: Pupils are equal, round, and reactive to light.  Cardiovascular:     Rate and Rhythm: Regular rhythm.  Heart sounds: S1 normal and S2 normal. No murmur. No friction rub. No gallop.   Pulmonary:     Effort: Pulmonary effort is normal. No respiratory distress.     Breath sounds: Normal breath sounds.  Chest:     Chest wall: No tenderness.  Abdominal:     General: Bowel sounds are normal.     Palpations: Abdomen is soft.     Tenderness: There is no abdominal tenderness. There is no guarding or rebound. Negative signs include Murphy's sign and McBurney's sign.     Hernia: No hernia is present.  Musculoskeletal:        General: Normal range of motion.     Cervical back: Normal range of motion and neck supple.     Right lower leg: Edema present.     Left lower leg: Edema present.  Skin:    General: Skin is warm and dry.     Findings: No rash.     Comments: Slight lacy erythema right lower extremity  Neurological:     Mental Status: She is alert and oriented to person, place, and time.     GCS: GCS eye subscore is 4. GCS verbal subscore is 5. GCS motor subscore is 6.     Cranial Nerves: No cranial nerve deficit.     Sensory: No sensory deficit.     Coordination: Coordination normal.  Psychiatric:        Speech: Speech normal.        Behavior: Behavior normal.        Thought Content: Thought content normal.     ED Results / Procedures / Treatments   Labs (all labs ordered are listed, but only abnormal results are displayed) Labs Reviewed  BASIC METABOLIC PANEL - Abnormal; Notable for the following components:      Result Value   Calcium 8.8 (*)    All other components within normal  limits  CBC WITH DIFFERENTIAL/PLATELET  BRAIN NATRIURETIC PEPTIDE  LACTIC ACID, PLASMA    EKG None  ED ECG REPORT   Date: 07/21/2019  Rate: 73  Rhythm: normal sinus rhythm  QRS Axis: normal  Intervals: PR shortened  ST/T Wave abnormalities: normal  Conduction Disutrbances:none  Narrative Interpretation:   Old EKG Reviewed: unchanged  I have personally reviewed the EKG tracing and agree with the computerized printout as noted.   Radiology DG Chest Port 1 View  Result Date: 07/21/2019 CLINICAL DATA:  Leg swelling and shortness of breath EXAM: PORTABLE CHEST 1 VIEW COMPARISON:  May 11, 2019 FINDINGS: The heart size is stable from prior study. There may be some mild vascular congestion without overt pulmonary edema. There is no pneumothorax. No large pleural effusion. No displaced fracture. IMPRESSION: Vascular congestion without overt pulmonary edema. Electronically Signed   By: Constance Holster M.D.   On: 07/21/2019 01:03    Procedures Procedures (including critical care time)  Medications Ordered in ED Medications  furosemide (LASIX) injection 60 mg (has no administration in time range)    ED Course  I have reviewed the triage vital signs and the nursing notes.  Pertinent labs & imaging results that were available during my care of the patient were reviewed by me and considered in my medical decision making (see chart for details).    MDM Rules/Calculators/A&P                      Patient presents to the emergency department for evaluation of progressively worsening lower extremity  swelling.  She reports that symptoms have been present for 1 year but have worsened over the last few days.  Reviewing her records reveals that she had an echo in January that showed greater than 60% ejection fraction with grade 1 diastolic dysfunction.  She takes Lasix, 40 mg daily "as needed".  She has not been taking it every day.    She does not appear to be experiencing any  significant congestive heart failure exacerbation.  Lungs are clear.  No JVD or gallop.  BNP is normal.  Chest x-ray does not show edema.  She is now experiencing some redness of the right lower leg.  There is some yellow crusting and weeping.  This is likely stasis dermatitis from progressively worsening peripheral edema, but cannot rule out early cellulitis.  She is afebrile and appears well, does not require hospitalization.  Will initiate antibiotics (doxycycline) and increase diuresis.  She has follow-up with her cardiologist, Dr. Einar Gip next week.  Final Clinical Impression(s) / ED Diagnoses Final diagnoses:  Peripheral edema    Rx / DC Orders ED Discharge Orders         Ordered    doxycycline (VIBRAMYCIN) 100 MG capsule  2 times daily     07/21/19 0113           Orpah Greek, MD 07/21/19 [phone]

## 2019-07-20 NOTE — ED Triage Notes (Signed)
Redness and swelling to RLE for " one year". States weeping started a few days ago and some SOB last few days. Also cut left calf getting out of the car.

## 2019-07-21 ENCOUNTER — Emergency Department (HOSPITAL_BASED_OUTPATIENT_CLINIC_OR_DEPARTMENT_OTHER): Payer: BC Managed Care – PPO

## 2019-07-21 DIAGNOSIS — R0602 Shortness of breath: Secondary | ICD-10-CM | POA: Diagnosis not present

## 2019-07-21 LAB — BASIC METABOLIC PANEL
Anion gap: 7 (ref 5–15)
BUN: 8 mg/dL (ref 6–20)
CO2: 30 mmol/L (ref 22–32)
Calcium: 8.8 mg/dL — ABNORMAL LOW (ref 8.9–10.3)
Chloride: 102 mmol/L (ref 98–111)
Creatinine, Ser: 0.81 mg/dL (ref 0.44–1.00)
GFR calc Af Amer: >60 mL/min (ref >60)
GFR calc non Af Amer: >60 mL/min (ref >60)
Glucose, Bld: 94 mg/dL (ref 70–99)
Potassium: 4.1 mmol/L (ref 3.5–5.1)
Sodium: 139 mmol/L (ref 135–145)

## 2019-07-21 LAB — CBC WITH DIFFERENTIAL/PLATELET
Abs Immature Granulocytes: 0 10*3/uL (ref 0.00–0.07)
Basophils Absolute: 0.1 10*3/uL (ref 0.0–0.1)
Basophils Relative: 1 %
Eosinophils Absolute: 0.2 10*3/uL (ref 0.0–0.5)
Eosinophils Relative: 3 %
HCT: 38.1 % (ref 36.0–46.0)
Hemoglobin: 12.3 g/dL (ref 12.0–15.0)
Immature Granulocytes: 0 %
Lymphocytes Relative: 47 %
Lymphs Abs: 2.4 10*3/uL (ref 0.7–4.0)
MCH: 31.5 pg (ref 26.0–34.0)
MCHC: 32.3 g/dL (ref 30.0–36.0)
MCV: 97.4 fL (ref 80.0–100.0)
Monocytes Absolute: 0.6 10*3/uL (ref 0.1–1.0)
Monocytes Relative: 13 %
Neutro Abs: 1.8 10*3/uL (ref 1.7–7.7)
Neutrophils Relative %: 36 %
Platelets: 273 10*3/uL (ref 150–400)
RBC: 3.91 MIL/uL (ref 3.87–5.11)
RDW: 13.7 % (ref 11.5–15.5)
WBC: 5 10*3/uL (ref 4.0–10.5)
nRBC: 0 % (ref 0.0–0.2)

## 2019-07-21 LAB — LACTIC ACID, PLASMA: Lactic Acid, Venous: 0.8 mmol/L (ref 0.5–1.9)

## 2019-07-21 LAB — BRAIN NATRIURETIC PEPTIDE: B Natriuretic Peptide: 32.5 pg/mL (ref 0.0–100.0)

## 2019-07-21 MED ORDER — DOXYCYCLINE HYCLATE 100 MG PO TABS
100.0000 mg | ORAL_TABLET | Freq: Once | ORAL | Status: AC
  Administered 2019-07-21: 100 mg via ORAL
  Filled 2019-07-21: qty 1

## 2019-07-21 MED ORDER — DOXYCYCLINE HYCLATE 100 MG PO CAPS: 100.0000 mg | ORAL_CAPSULE | Freq: Two times a day (BID) | ORAL | 0 refills | Status: DC

## 2019-07-21 MED ORDER — FUROSEMIDE 10 MG/ML IJ SOLN
60.0000 mg | Freq: Once | INTRAMUSCULAR | Status: AC
  Administered 2019-07-21: 60 mg via INTRAVENOUS
  Filled 2019-07-21: qty 6

## 2019-07-21 NOTE — Discharge Instructions (Addendum)
Take the antibiotic as prescribed until it is finished.  Take your Lasix every day until you see Dr. Einar Gip.  Return to the ER for fever or worsening difficulty breathing.

## 2019-07-21 NOTE — ED Notes (Signed)
Pt has not urinated, external cath in place properly.

## 2019-07-24 ENCOUNTER — Ambulatory Visit: Payer: BC Managed Care – PPO | Admitting: Cardiology

## 2019-07-24 ENCOUNTER — Encounter: Payer: Self-pay | Admitting: Cardiology

## 2019-07-24 ENCOUNTER — Other Ambulatory Visit: Payer: Self-pay

## 2019-07-24 VITALS — BP 108/77 | HR 98 | Temp 97.1°F | Resp 16 | Ht 65.0 in | Wt 246.3 lb

## 2019-07-24 DIAGNOSIS — R600 Localized edema: Secondary | ICD-10-CM | POA: Diagnosis not present

## 2019-07-24 DIAGNOSIS — I872 Venous insufficiency (chronic) (peripheral): Secondary | ICD-10-CM | POA: Diagnosis not present

## 2019-07-24 DIAGNOSIS — R0609 Other forms of dyspnea: Secondary | ICD-10-CM | POA: Diagnosis not present

## 2019-07-24 MED ORDER — TORSEMIDE 20 MG PO TABS: 20.0000 mg | ORAL_TABLET | Freq: Two times a day (BID) | ORAL | 1 refills | Status: DC

## 2019-07-24 NOTE — Progress Notes (Signed)
Primary Physician/Referring:  Patient, No Pcp Per  Patient ID: [name], female    DOB: 09/13/1960, 59 y.o.   MRN: [id number]  Chief Complaint  Patient presents with  . Congestive Heart Failure   HPI:    [name]  is a 59 y.o. Caucasian female with bipolar disorder, polysubstance use, chronic pain syndrome, fibromyalgia, Rheumatoid arthritis, degenerative joint disease, last seen in the emergency room on 09/09/2018 with drug overdose and received Narcan and walked out [place].  Past cardiac history includes history of idiopathic pericarditis in 2015.  I had seen her 2 months ago for possible congestive heart failure, I felt her symptoms may related to fluid overload state from lack of mobility, venous insufficiency and poor eating habits.  She now presents for follow-up.  Continues to have chronic dyspnea.  She has had chronic bilateral leg edema, has had venous sclerotherapy bilateral lower extremity.  She has marked limitation in activity due to her underlying arthritis.  Has not used cocaine since Dec 26th 2020. Also smokes 1-1.5 packs of cigarettes a day.  Past Medical History:  Diagnosis Date  . Acute pericarditis    a. 04/2014 Cath: nl cors, EF 65-70%;  b. 04/2014 Echo: EF 65-70%, mild TR.  Marland Kitchen Allergy   . Anal fissure   . Anemia   . Anxiety   . Bipolar 1 disorder ([place])   . Chronic lower back pain   . Chronic pain   . Fibromyalgia   . GERD (gastroesophageal reflux disease)   . HLD (hyperlipidemia)   . Hypothyroidism   . RA (rheumatoid arthritis) ([place])    "all over"  . Substance abuse ([place])   . Wrist fracture    right   Past Surgical History:  Procedure Laterality Date  . ABDOMINAL HYSTERECTOMY  03/2004  . BACK SURGERY    . [place]; 1999  . COLONOSCOPY    . FOOT SURGERY Right 01/10/2016  . GASTRIC BYPASS  2001  . LEFT HEART CATHETERIZATION WITH CORONARY ANGIOGRAM N/A 04/23/2014   Normal coronaries and LVF  . OPEN REDUCTION INTERNAL  FIXATION (ORIF) DISTAL RADIAL FRACTURE Right 10/31/2017   Procedure: OPEN REDUCTION INTERNAL FIXATION (ORIF) DISTAL RADIAL FRACTURE;  Surgeon: Hiram Gash, MD;  Location: [facility];  Service: Orthopedics;  Laterality: Right;  . SHOULDER ARTHROSCOPY W/ ROTATOR CUFF REPAIR Right ~ 2011  . TARSAL METATARSAL FUSION WITH WEIL OSTEOTOMY Left 2013  . TONSILLECTOMY    . TOTAL SHOULDER ARTHROPLASTY     Social History   Tobacco Use  . Smoking status: Current Every Day Smoker    Packs/day: 1.00    Years: 34.00    Pack years: 34.00    Types: Cigarettes  . Smokeless tobacco: Never Used  Substance Use Topics  . Alcohol use: No    ROS  Review of Systems  Constitution: Positive for malaise/fatigue.  Cardiovascular: Positive for dyspnea on exertion and leg swelling. Negative for chest pain.  Musculoskeletal: Positive for arthritis and back pain.  Gastrointestinal: Negative for melena.  Psychiatric/Behavioral: Positive for depression.   Objective  Blood pressure 108/77, pulse 98, temperature (!) 97.1 F (36.2 C), temperature source Temporal, resp. rate 16, height 5\' 5"  (1.651 m), weight 246 lb 4.8 oz (111.7 kg), SpO2 98 %. Body mass index is 40.99 kg/m.  Vitals with BMI 07/24/2019 07/21/2019 07/21/2019  Height 5\' 5"  - -  Weight 246 lbs 5 oz - -  BMI [id number] - -  Systolic 123XX123 123XX123 98  Diastolic 77 71 70  Pulse 98 83 76     Physical Exam  Constitutional:  Moderately built and morbidly obese, appears much more older than stated age, appears ill-looking.  No acute distress.  Neck:  Short neck and difficult to evaluate JVP  Cardiovascular: Normal rate, regular rhythm, normal heart sounds and intact distal pulses. Exam reveals no gallop.  No murmur heard. Pulses:      Carotid pulses are 2+ on the right side and 2+ on the left side.      Dorsalis pedis pulses are 2+ on the right side and 2+ on the left side.       Posterior tibial pulses are 2+ on the right side and 2+ on the left side.  Femoral and  popliteal pulse difficult to feel due to patient's body habitus.  3+ right lower extremity edema and 2+ left lower extremity edema bilateral above-the-knee, pitting.  Pulmonary/Chest: Effort normal and breath sounds normal.  Abdominal: Soft. Bowel sounds are normal.  Obese. Pannus present   Laboratory examination:   Recent Labs    05/11/19 1722 05/12/19 0147 07/21/19 0009  NA 136  --  139  K 4.4  --  4.1  CL 101  --  102  CO2 25  --  30  GLUCOSE 216*  --  94  BUN 14  --  8  CREATININE 1.27* 0.90 0.81  CALCIUM 8.5*  --  8.8*  GFRNONAA 47* >60 >60  GFRAA 55* >60 >60   estimated creatinine clearance is 96.6 mL/min (by C-G formula based on SCr of 0.81 mg/dL).  CMP Latest Ref Rng & Units 07/21/2019 05/12/2019 05/11/2019  Glucose 70 - 99 mg/dL 94 - 216(H)  BUN 6 - 20 mg/dL 8 - 14  Creatinine 0.44 - 1.00 mg/dL 0.81 0.90 1.27(H)  Sodium 135 - 145 mmol/L 139 - 136  Potassium 3.5 - 5.1 mmol/L 4.1 - 4.4  Chloride 98 - 111 mmol/L 102 - 101  CO2 22 - 32 mmol/L 30 - 25  Calcium 8.9 - 10.3 mg/dL 8.8(L) - 8.5(L)  Total Protein 6.5 - 8.1 g/dL - - 6.5  Total Bilirubin 0.3 - 1.2 mg/dL - - 0.4  Alkaline Phos 38 - 126 U/L - - 71  AST 15 - 41 U/L - - 64(H)  ALT 0 - 44 U/L - - 37   CBC Latest Ref Rng & Units 07/21/2019 05/11/2019 02/06/2018  WBC 4.0 - 10.5 K/uL 5.0 18.5(H) 7.1  Hemoglobin 12.0 - 15.0 g/dL 12.3 13.0 13.5  Hematocrit 36.0 - 46.0 % 38.1 41.2 39.4  Platelets 150 - 400 K/uL 273 336 302   Lipid Panel     Component Value Date/Time   CHOL 197 11/25/2013 0905   TRIG 87 11/25/2013 0905   HDL 61 11/25/2013 0905   CHOLHDL 3.2 11/25/2013 0905   VLDL 17 11/25/2013 0905   LDLCALC 119 (H) 11/25/2013 0905   HEMOGLOBIN A1C Lab Results  Component Value Date   HGBA1C 5.0 05/12/2019   MPG 96.8 05/12/2019   TSH Recent Labs    05/12/19 0147  TSH 2.641   Medications and allergies   Allergies  Allergen Reactions  . Amitriptyline Palpitations    Increases heart rate     Current  Outpatient Medications  Medication Instructions  . albuterol (VENTOLIN HFA) 108 (90 Base) MCG/ACT inhaler 2 puffs, Inhalation, Every 4 hours PRN  . ALPRAZolam (XANAX) 0.5 mg, Oral, 2 times daily  . ARIPiprazole (ABILIFY) 2 mg, Oral, Daily at  bedtime  . diclofenac (VOLTAREN) 50 mg, Oral, 2 times daily PRN  . doxycycline (VIBRAMYCIN) 100 mg, Oral, 2 times daily  . DULoxetine (CYMBALTA) 120 mg, Oral, Daily  . levocetirizine (XYZAL) 5 mg, Oral, Daily  . levothyroxine (SYNTHROID) 137 MCG tablet Oral, Daily before breakfast  . NONFORMULARY OR COMPOUNDED ITEM [facility]:  Achilles Tendonitis Cream - Diclofena 3%, Baclofen 2%, Bupivacaine 1%, Gabapentin 6%, Ibuprofen 3%, Pentoxifylline 3%. Apply 1-2 grams to affected area 3-4 times daily as needed for pain.  . pregabalin (LYRICA) 200 mg, 2 times daily  . spironolactone (ALDACTONE) 50 mg, Oral, Daily  . torsemide (DEMADEX) 20 mg, Oral, 2 times daily  . traZODone (DESYREL) 100 mg, Oral, Daily at bedtime    Radiology:  No results found.  Cardiac Studies:   Echocardiogram 05/21/2019:  Left ventricle cavity is normal in size and wall thickness. Normal global  wall motion. Normal LV systolic function with EF 62%. Doppler evidence of  grade I (impaired) diastolic dysfunction, normal LAP. Left atrial cavity  is mildly dilated.  Trileaflet aortic valve. Increased velocity likely due to hyperdynamic LV.  No valvular stenosis. Trace aortic regurgitation.  Mild (Grade I) mitral regurgitation.  Mild tricuspid regurgitation. Estimated pulmonary artery systolic pressure  is 22 mmHg.  EKG 07/16/2019: Normal sinus rhythm at rate of 91 beats minute, normal axis.  No evidence of ischemia, normal EKG.  No significant change from  05/11/2019.  Assessment     ICD-10-CM   1. Dyspnea on exertion  R06.00 EKG 12-Lead  2. Venous insufficiency of both lower extremities  I87.2 Ambulatory referral to Vascular Surgery  3. Bilateral leg edema  R60.0 Ambulatory  referral to Vascular Surgery    torsemide (DEMADEX) 20 MG tablet    Basic metabolic panel    Recommendations:   Meds ordered this encounter  Medications  . torsemide (DEMADEX) 20 MG tablet    Sig: Take 1 tablet (20 mg total) by mouth 2 (two) times daily.    Dispense:  60 tablet    Refill:  1    Dericka Wortham  is a 59 y.o. Caucasian female with bipolar disorder, polysubstance use, chronic pain syndrome, fibromyalgia, Rheumatoid arthritis, degenerative joint disease, last seen in the emergency room on 09/09/2018 with drug overdose and received Narcan and walked out [place].     Her BNP was completely normal, cardiac examination is normal without any gallop or murmur, no JVD, hence I do not suspect congestive heart failure.  Her leg edema is related to lack of mobility, dependent edema and also she has varicose veins and has history of vein stripping/sclerotherapy in the past.    She may need to revisit this, I have referred her to be evaluated at [facility].     She has been abstinent from cocaine since Dec 26th, 2020.  I will discontinue furosemide and switch her to torsemide 20 mg p.o. twice daily.  She will continue with Aldactone 50 mg daily.  Recent BMP evaluated, renal function is normal.  I would like to see her back in 3 to 4 weeks for follow-up.  I will repeat BMP in 10 days.  I also called and spoke with her father regarding this.  Advised her to have nearly complete bedrest so she can mobilize the fluid.  Presently she has 3+ leg edema and it will be impossible for her to wear a support stocking.  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  She will  eventually need cardiac evaluation but I will wait till she is medically stable to exclude ischemia.  Adrian Prows, MD, [facility] 07/24/2019, 6:25 PM [facility]. [place] Office: [phone]

## 2019-07-24 NOTE — Patient Instructions (Signed)
Try to remain in bed as much as possible with foot elevated, try to avoid salty food.  I wanted to get blood work done in 2 days from now.  I would like to see you back here in about 3 to 4 weeks for follow-up.  I am referring you to have venous insufficiency study to look for any problems with the veins in your legs.

## 2019-07-30 ENCOUNTER — Encounter (INDEPENDENT_AMBULATORY_CARE_PROVIDER_SITE_OTHER): Payer: Self-pay | Admitting: Vascular Surgery

## 2019-07-31 DIAGNOSIS — F3342 Major depressive disorder, recurrent, in full remission: Secondary | ICD-10-CM | POA: Diagnosis not present

## 2019-07-31 DIAGNOSIS — F410 Panic disorder [episodic paroxysmal anxiety] without agoraphobia: Secondary | ICD-10-CM | POA: Diagnosis not present

## 2019-08-02 ENCOUNTER — Other Ambulatory Visit: Payer: Self-pay

## 2019-08-02 ENCOUNTER — Ambulatory Visit (INDEPENDENT_AMBULATORY_CARE_PROVIDER_SITE_OTHER): Payer: BC Managed Care – PPO | Admitting: Podiatry

## 2019-08-02 ENCOUNTER — Ambulatory Visit: Payer: BC Managed Care – PPO | Attending: Internal Medicine

## 2019-08-02 ENCOUNTER — Encounter: Payer: Self-pay | Admitting: Podiatry

## 2019-08-02 VITALS — Temp 98.3°F

## 2019-08-02 DIAGNOSIS — M79675 Pain in left toe(s): Secondary | ICD-10-CM

## 2019-08-02 DIAGNOSIS — M19071 Primary osteoarthritis, right ankle and foot: Secondary | ICD-10-CM | POA: Diagnosis not present

## 2019-08-02 DIAGNOSIS — M79674 Pain in right toe(s): Secondary | ICD-10-CM

## 2019-08-02 DIAGNOSIS — Z23 Encounter for immunization: Secondary | ICD-10-CM

## 2019-08-02 DIAGNOSIS — R2681 Unsteadiness on feet: Secondary | ICD-10-CM

## 2019-08-02 DIAGNOSIS — B351 Tinea unguium: Secondary | ICD-10-CM | POA: Diagnosis not present

## 2019-08-02 DIAGNOSIS — I872 Venous insufficiency (chronic) (peripheral): Secondary | ICD-10-CM

## 2019-08-02 DIAGNOSIS — Q828 Other specified congenital malformations of skin: Secondary | ICD-10-CM

## 2019-08-02 DIAGNOSIS — M79673 Pain in unspecified foot: Secondary | ICD-10-CM

## 2019-08-02 DIAGNOSIS — G8929 Other chronic pain: Secondary | ICD-10-CM

## 2019-08-02 NOTE — Progress Notes (Signed)
   Covid-19 Vaccination Clinic  Name:  [name]    MRN: [id number] DOB: 08-28-1960  08/02/2019  Ms. Eisaman was observed post Covid-19 immunization for 15 minutes without incident. She was provided with Vaccine Information Sheet and instruction to access the V-Safe system.   Ms. Hankes was instructed to call 911 with any severe reactions post vaccine: Marland Kitchen Difficulty breathing  . Swelling of face and throat  . A fast heartbeat  . A bad rash all over body  . Dizziness and weakness   Immunizations Administered    Name Date Dose VIS Date Route   Pfizer COVID-19 Vaccine 08/02/2019 10:49 AM 0.3 mL 04/27/2019 Intramuscular   Manufacturer: [place]   Lot: [id number]   [place]: [id number]

## 2019-08-06 NOTE — Progress Notes (Signed)
Subjective: 59 year old female presents the office today due to her left shoe causing her to roll and turn her foot outwards.  She is worn out the shoe.  She still awaiting her shoes are on back order.  She is asking there is anything we can do to try to help prevent her foot from rolling.  Also asking the nails be trimmed today they are thick and elongated.  Difficult for her to trim them herself.  No redness or drainage to the toenail sites. Denies any systemic complaints such as fevers, chills, nausea, vomiting. No acute changes since last appointment, and no other complaints at this time.   She is going to be following up with vascular at [facility] due to swelling.  Objective: AAO x3, NAD DP/PT pulses palpable bilaterally, CRT less than 3 seconds Chronic bilateral lower extremity edema is present. Nails are hypertrophic, dystrophic, brittle, discolored, elongated 10. No surrounding redness or drainage. Tenderness nails 1-5 bilaterally. No open lesions or pre-ulcerative lesions are identified today. She has an abducted foot type on the left and upon evaluation of her shoes she is worn out the lateral aspect of the left side worse than right.  There is no significant discomfort upon palpation today but she still has chronic generalized discomfort of the feet with walking.  There is no redness or warmth associated with swelling.  Mild hyperkeratotic tissue fifth metatarsal base left foot without any underlying ulceration or signs of infection No open lesions or pre-ulcerative lesions.  No pain with calf compression, swelling, warmth, erythema  Assessment: Symptomatic onychomycosis, hyperkeratotic lesions with chronic foot pain/gait changes  Plan: -All treatment options discussed with the patient including all alternatives, risks, complications.  -Debrided nails x10 without any complications or bleeding -Debrided hyperkeratotic lesion x1 without any complications or bleeding   -Rick modify her current shoes in order to add a lateral approach to help prevent her from her wound.  Awaiting custom shoes.  Unfortunately they are on back order. -Patient encouraged to call the office with any questions, concerns, change in symptoms.   Trula Slade DPM

## 2019-08-13 ENCOUNTER — Encounter (INDEPENDENT_AMBULATORY_CARE_PROVIDER_SITE_OTHER): Payer: Self-pay | Admitting: Vascular Surgery

## 2019-08-13 ENCOUNTER — Other Ambulatory Visit: Payer: Self-pay

## 2019-08-13 ENCOUNTER — Ambulatory Visit (INDEPENDENT_AMBULATORY_CARE_PROVIDER_SITE_OTHER): Payer: BC Managed Care – PPO | Admitting: Vascular Surgery

## 2019-08-13 VITALS — BP 98/61 | HR 90 | Ht 65.0 in | Wt 241.0 lb

## 2019-08-13 DIAGNOSIS — I890 Lymphedema, not elsewhere classified: Secondary | ICD-10-CM | POA: Diagnosis not present

## 2019-08-13 DIAGNOSIS — I872 Venous insufficiency (chronic) (peripheral): Secondary | ICD-10-CM | POA: Diagnosis not present

## 2019-08-13 DIAGNOSIS — M059 Rheumatoid arthritis with rheumatoid factor, unspecified: Secondary | ICD-10-CM | POA: Diagnosis not present

## 2019-08-13 NOTE — Progress Notes (Signed)
MRN : [id number]  [name] is a 59 y.o. (12/10/1960) female who presents with chief complaint of  Chief Complaint  Patient presents with  . New Patient (Initial Visit)    LE Edema  .  History of Present Illness:   Patient is seen for evaluation of leg pain and leg swelling. The patient first noticed the swelling remotely. The swelling is associated with pain and discoloration. The pain and swelling worsens with prolonged dependency and improves with elevation. The pain is unrelated to activity.  The patient notes that in the morning the legs are significantly improved but they steadily worsened throughout the course of the day. The patient also notes a steady worsening of the discoloration in the ankle and shin area.   The patient denies claudication symptoms.  The patient denies symptoms consistent with rest pain.  The patient denies and extensive history of DJD and LS spine disease.  The patient has no had any past angiography, interventions or vascular surgery.  Elevation makes the leg symptoms better, dependency makes them much worse. There is no history of ulcerations. The patient denies any recent changes in medications.  The patient has not been wearing graduated compression.  The patient denies a history of DVT or PE. There is no prior history of phlebitis. There is no history of primary lymphedema.  No history of malignancies. No history of trauma or groin or pelvic surgery. There is no history of radiation treatment to the groin or pelvis  The patient denies amaurosis fugax or recent TIA symptoms. There are no recent neurological changes noted. The patient denies recent episodes of angina or shortness of breath  Current Meds  Medication Sig  . albuterol (VENTOLIN HFA) 108 (90 Base) MCG/ACT inhaler Inhale 2 puffs into the lungs every 4 (four) hours as needed.  . ALPRAZolam (XANAX) 0.5 MG tablet Take 0.5 mg by mouth 2 (two) times daily.   Marland Kitchen ALPRAZolam (XANAX) 1 MG  tablet Take 1 mg by mouth 2 (two) times daily.  . ARIPiprazole (ABILIFY) 2 MG tablet Take 2 mg by mouth at bedtime.  . Buprenorphine HCl-Naloxone HCl 8-2 MG FILM Place under the tongue 3 (three) times daily.  . diclofenac (VOLTAREN) 50 MG EC tablet Take 50 mg by mouth 2 (two) times daily as needed.  . DULoxetine (CYMBALTA) 60 MG capsule Take 120 mg by mouth daily.  Marland Kitchen levocetirizine (XYZAL) 5 MG tablet Take 5 mg by mouth daily.  Marland Kitchen levothyroxine (SYNTHROID) 137 MCG tablet Take by mouth daily before breakfast.   . pregabalin (LYRICA) 200 MG capsule Take 200 mg by mouth 2 (two) times daily.  Marland Kitchen torsemide (DEMADEX) 20 MG tablet Take 1 tablet (20 mg total) by mouth 2 (two) times daily.  . traZODone (DESYREL) 100 MG tablet Take 100 mg by mouth at bedtime.    Current Facility-Administered Medications for the 08/13/19 encounter (Office Visit) with Delana Meyer, Dolores [name], MD  Medication  . triamcinolone acetonide (KENALOG) 10 MG/ML injection 10 mg    Past Medical History:  Diagnosis Date  . Acute pericarditis    a. 04/2014 Cath: nl cors, EF 65-70%;  b. 04/2014 Echo: EF 65-70%, mild TR.  Marland Kitchen Allergy   . Anal fissure   . Anemia   . Anxiety   . Bipolar 1 disorder ([place])   . Chronic lower back pain   . Chronic pain   . Fibromyalgia   . GERD (gastroesophageal reflux disease)   . HLD (hyperlipidemia)   . Hypothyroidism   .  RA (rheumatoid arthritis) ([place])    "all over"  . Substance abuse ([place])   . Wrist fracture    right    Past Surgical History:  Procedure Laterality Date  . ABDOMINAL HYSTERECTOMY  03/2004  . BACK SURGERY    . [place]; 1999  . COLONOSCOPY    . FOOT SURGERY Right 01/10/2016  . GASTRIC BYPASS  2001  . LEFT HEART CATHETERIZATION WITH CORONARY ANGIOGRAM N/A 04/23/2014   Normal coronaries and LVF  . OPEN REDUCTION INTERNAL FIXATION (ORIF) DISTAL RADIAL FRACTURE Right 10/31/2017   Procedure: OPEN REDUCTION INTERNAL FIXATION (ORIF) DISTAL RADIAL FRACTURE;  Surgeon:  Hiram Gash, MD;  Location: [facility];  Service: Orthopedics;  Laterality: Right;  . SHOULDER ARTHROSCOPY W/ ROTATOR CUFF REPAIR Right ~ 2011  . TARSAL METATARSAL FUSION WITH WEIL OSTEOTOMY Left 2013  . TONSILLECTOMY    . TOTAL SHOULDER ARTHROPLASTY      Social History Social History   Tobacco Use  . Smoking status: Current Every Day Smoker    Packs/day: 1.00    Years: 34.00    Pack years: 34.00    Types: Cigarettes  . Smokeless tobacco: Never Used  Substance Use Topics  . Alcohol use: No  . Drug use: Yes    Types: Cocaine    Comment: Has not used cocaine since 05/12/2019    Family History Family History  Problem Relation Age of Onset  . Thyroid disease Mother   . Colon polyps Father   . Colon polyps Sister   . Heart attack Maternal Grandfather   . Colon polyps Paternal Grandmother   . Colon cancer Neg Hx   No family history of bleeding/clotting disorders, porphyria or autoimmune disease   Allergies  Allergen Reactions  . Amitriptyline Palpitations    Increases heart rate     REVIEW OF SYSTEMS (Negative unless checked)  Constitutional: [] Weight loss  [] Fever  [] Chills Cardiac: [] Chest pain   [] Chest pressure   [] Palpitations   [] Shortness of breath when laying flat   [] Shortness of breath with exertion. Vascular:  [] Pain in legs with walking   [x] Pain in legs at rest  [] History of DVT   [] Phlebitis   [x] Swelling in legs   [] Varicose veins   [] Non-healing ulcers Pulmonary:   [] Uses home oxygen   [] Productive cough   [] Hemoptysis   [] Wheeze  [] COPD   [] Asthma Neurologic:  [] Dizziness   [] Seizures   [] History of stroke   [] History of TIA  [] Aphasia   [] Vissual changes   [] Weakness or numbness in arm   [] Weakness or numbness in leg Musculoskeletal:   [] Joint swelling   [] Joint pain   [] Low back pain Hematologic:  [] Easy bruising  [] Easy bleeding   [] Hypercoagulable state   [] Anemic Gastrointestinal:  [] Diarrhea   [] Vomiting  [] Gastroesophageal reflux/heartburn    [] Difficulty swallowing. Genitourinary:  [] Chronic kidney disease   [] Difficult urination  [] Frequent urination   [] Blood in urine Skin:  [] Rashes   [] Ulcers  Psychological:  [] History of anxiety   []  History of major depression.  Physical Examination  Vitals:   08/13/19 1324  BP: 98/61  Pulse: 90  Weight: 241 lb (109.3 kg)  Height: 5\' 5"  (1.651 m)   Body mass index is 40.1 kg/m. Gen: WD/WN, NAD Head: [place]/AT, No temporalis wasting.  Ear/Nose/Throat: Hearing grossly intact, nares w/o erythema or drainage, poor dentition Eyes: PER, EOMI, sclera nonicteric.  Neck: Supple, no masses.  No bruit or JVD.  Pulmonary:  Good air movement,  clear to auscultation bilaterally, no use of accessory muscles.  Cardiac: RRR, normal S1, S2, no Murmurs. Vascular: scattered varicosities present bilaterally.  Mild venous stasis changes to the legs bilaterally.  2+ soft pitting edema. Vessel Right Left  Radial Palpable Palpable  Gastrointestinal: soft, non-distended. No guarding/no peritoneal signs.  Musculoskeletal: M/S 5/5 throughout.  No deformity or atrophy.  Neurologic: CN 2-12 intact. Pain and light touch intact in extremities.  Symmetrical.  Speech is fluent. Motor exam as listed above. Psychiatric: Judgment intact, Mood & affect appropriate for pt's clinical situation. Dermatologic: No rashes or ulcers noted.  No changes consistent with cellulitis. Lymph : No Cervical lymphadenopathy, no lichenification or skin changes of chronic lymphedema.  CBC Lab Results  Component Value Date   WBC 5.0 07/21/2019   HGB 12.3 07/21/2019   HCT 38.1 07/21/2019   MCV 97.4 07/21/2019   PLT 273 07/21/2019    BMET    Component Value Date/Time   NA 139 07/21/2019 0009   K 4.1 07/21/2019 0009   CL 102 07/21/2019 0009   CO2 30 07/21/2019 0009   GLUCOSE 94 07/21/2019 0009   BUN 8 07/21/2019 0009   CREATININE 0.81 07/21/2019 0009   CREATININE 0.85 02/06/2018 0000   CALCIUM 8.8 (L) 07/21/2019 0009    GFRNONAA >60 07/21/2019 0009   GFRNONAA 77 02/06/2018 0000   GFRAA >60 07/21/2019 0009   GFRAA 89 02/06/2018 0000   CrCl cannot be calculated (Patient's most recent lab result is older than the maximum 21 days allowed.).  COAG No results found for: INR, PROTIME  Radiology DG Chest Port 1 View  Result Date: 07/21/2019 CLINICAL DATA:  Leg swelling and shortness of breath EXAM: PORTABLE CHEST 1 VIEW COMPARISON:  May 11, 2019 FINDINGS: The heart size is stable from prior study. There may be some mild vascular congestion without overt pulmonary edema. There is no pneumothorax. No large pleural effusion. No displaced fracture. IMPRESSION: Vascular congestion without overt pulmonary edema. Electronically Signed   By: Constance Holster M.D.   On: 07/21/2019 01:03     Assessment/Plan 1. Chronic venous insufficiency No surgery or intervention at this point in time.    I have had a long discussion with the patient regarding venous insufficiency and why it  causes symptoms. I have discussed with the patient the chronic skin changes that accompany venous insufficiency and the long term sequela such as infection and ulceration.  Patient will begin wearing graduated compression stockings class 1 (20-30 mmHg) or compression wraps on a daily basis a prescription was given. The patient will put the stockings on first thing in the morning and removing them in the evening. The patient is instructed specifically not to sleep in the stockings.    In addition, behavioral modification including several periods of elevation of the lower extremities during the day will be continued. I have demonstrated that proper elevation is a position with the ankles at heart level.  The patient is instructed to begin routine exercise, especially walking on a daily basis  Patient should undergo duplex ultrasound of the venous system to ensure that DVT or reflux is not present.  Following the review of the ultrasound the  patient will follow up in 2-3 months to reassess the degree of swelling and the control that graduated compression stockings or compression wraps  is offering.   The patient can be assessed for a Lymph Pump at that time  - VAS [place] LOWER EXTREMITY VENOUS REFLUX; Future  2. Lymphedema I  have had a long discussion with the patient regarding swelling and why it  causes symptoms.  Patient will begin wearing graduated compression stockings class 1 (20-30 mmHg) on a daily basis a prescription was given. The patient will  beginning wearing the stockings first thing in the morning and removing them in the evening. The patient is instructed specifically not to sleep in the stockings.   In addition, behavioral modification will be initiated.  This will include frequent elevation, use of over the counter pain medications and exercise such as walking.  I have reviewed systemic causes for chronic edema such as liver, kidney and cardiac etiologies.  The patient denies problems with these organ systems.    Consideration for a lymph pump will also be made based upon the effectiveness of conservative therapy.  This would help to improve the edema control and prevent sequela such as ulcers and infections   Patient should undergo duplex ultrasound of the venous system to ensure that DVT or reflux is not present.  The patient will follow-up with me after the ultrasound.   - VAS [place] LOWER EXTREMITY VENOUS REFLUX; Future  3. Rheumatoid arthritis with positive rheumatoid factor, involving unspecified site [facility]) Continue NSAID medications as already ordered, these medications have been reviewed and there are no changes at this time.  Continued activity and therapy was stressed.     Hortencia Pilar, MD  08/13/2019 2:05 PM

## 2019-08-14 ENCOUNTER — Telehealth: Payer: Self-pay | Admitting: *Deleted

## 2019-08-14 NOTE — Telephone Encounter (Signed)
Pt called states she would like Dr. Jacqualyn Posey to refer her to Dr. Dian Situ for pain pump in back.

## 2019-08-16 ENCOUNTER — Telehealth: Payer: Self-pay | Admitting: *Deleted

## 2019-08-16 DIAGNOSIS — M19071 Primary osteoarthritis, right ankle and foot: Secondary | ICD-10-CM

## 2019-08-16 DIAGNOSIS — M79674 Pain in right toe(s): Secondary | ICD-10-CM

## 2019-08-16 DIAGNOSIS — M79675 Pain in left toe(s): Secondary | ICD-10-CM

## 2019-08-16 DIAGNOSIS — G8929 Other chronic pain: Secondary | ICD-10-CM

## 2019-08-16 DIAGNOSIS — M79673 Pain in unspecified foot: Secondary | ICD-10-CM

## 2019-08-16 NOTE — Telephone Encounter (Signed)
That is fine to put the referral in

## 2019-08-16 NOTE — Telephone Encounter (Addendum)
Pt called states if I had sent just the last 6 months of clinicals Dr. Vira Blanco would not know about her surgeries. I told pt I would send the surgery notes.

## 2019-08-16 NOTE — Telephone Encounter (Signed)
Pt states she would like her records sent to Dr. Dian Situ and referral for pain pump in spine.

## 2019-08-16 NOTE — Telephone Encounter (Signed)
I informed pt the referral had been sent for the pain pump to the spine, referral to Dr. Vira Blanco.

## 2019-08-16 NOTE — Telephone Encounter (Signed)
Dr. Berton Lan the referral to [facility] - Dr Dian Situ. Faxed referral, clinical and demographics to [facility] - Dr. Vira Blanco.

## 2019-08-21 ENCOUNTER — Ambulatory Visit: Payer: BC Managed Care – PPO | Admitting: Podiatry

## 2019-08-21 ENCOUNTER — Telehealth (INDEPENDENT_AMBULATORY_CARE_PROVIDER_SITE_OTHER): Payer: Self-pay

## 2019-08-21 NOTE — Telephone Encounter (Signed)
Patient left a message for a return call regarding getting a lymphedema pump. I attempted to contact the patient to let her know per Dr. Nino Parsley office note on 08/13/19 states she has to do conservative therapy like wearing compression hose, elevation and exercise and at her next appt which will include a duplex ultrasound, consideration for a lymph pump will be discussed. A message was left for a return call.

## 2019-08-22 ENCOUNTER — Ambulatory Visit: Payer: BC Managed Care – PPO | Admitting: Cardiology

## 2019-08-23 ENCOUNTER — Encounter (INDEPENDENT_AMBULATORY_CARE_PROVIDER_SITE_OTHER): Payer: Self-pay | Admitting: Vascular Surgery

## 2019-08-27 ENCOUNTER — Ambulatory Visit: Payer: BC Managed Care – PPO | Attending: Internal Medicine

## 2019-08-27 DIAGNOSIS — Z23 Encounter for immunization: Secondary | ICD-10-CM

## 2019-08-27 NOTE — Progress Notes (Signed)
   Covid-19 Vaccination Clinic  Name:  [name]    MRN: [id number] DOB: Nov 11, 1960  08/27/2019  [name] was observed post Covid-19 immunization for 15 minutes without incident. She was provided with Vaccine Information Sheet and instruction to access the V-Safe system.   [name] was instructed to call 911 with any severe reactions post vaccine: Marland Kitchen Difficulty breathing  . Swelling of face and throat  . A fast heartbeat  . A bad rash all over body  . Dizziness and weakness   Immunizations Administered    Name Date Dose VIS Date Route   Pfizer COVID-19 Vaccine 08/27/2019 11:16 AM 0.3 mL 04/27/2019 Intramuscular   Manufacturer: [place]   Lot: [id number]   [place]: [id number]

## 2019-08-29 DIAGNOSIS — G894 Chronic pain syndrome: Secondary | ICD-10-CM | POA: Diagnosis not present

## 2019-09-03 ENCOUNTER — Other Ambulatory Visit: Payer: Self-pay

## 2019-09-03 ENCOUNTER — Ambulatory Visit (INDEPENDENT_AMBULATORY_CARE_PROVIDER_SITE_OTHER): Payer: BC Managed Care – PPO | Admitting: Podiatry

## 2019-09-03 VITALS — Temp 98.1°F

## 2019-09-03 DIAGNOSIS — M779 Enthesopathy, unspecified: Secondary | ICD-10-CM | POA: Diagnosis not present

## 2019-09-03 DIAGNOSIS — M792 Neuralgia and neuritis, unspecified: Secondary | ICD-10-CM | POA: Diagnosis not present

## 2019-09-03 DIAGNOSIS — Q828 Other specified congenital malformations of skin: Secondary | ICD-10-CM

## 2019-09-03 DIAGNOSIS — Z79899 Other long term (current) drug therapy: Secondary | ICD-10-CM | POA: Diagnosis not present

## 2019-09-03 DIAGNOSIS — G894 Chronic pain syndrome: Secondary | ICD-10-CM | POA: Diagnosis not present

## 2019-09-03 DIAGNOSIS — M1990 Unspecified osteoarthritis, unspecified site: Secondary | ICD-10-CM | POA: Diagnosis not present

## 2019-09-03 DIAGNOSIS — M797 Fibromyalgia: Secondary | ICD-10-CM | POA: Diagnosis not present

## 2019-09-03 DIAGNOSIS — Z79891 Long term (current) use of opiate analgesic: Secondary | ICD-10-CM | POA: Diagnosis not present

## 2019-09-14 DIAGNOSIS — M2550 Pain in unspecified joint: Secondary | ICD-10-CM | POA: Diagnosis not present

## 2019-09-14 DIAGNOSIS — Z5181 Encounter for therapeutic drug level monitoring: Secondary | ICD-10-CM | POA: Diagnosis not present

## 2019-09-14 DIAGNOSIS — M797 Fibromyalgia: Secondary | ICD-10-CM | POA: Diagnosis not present

## 2019-09-14 DIAGNOSIS — Z79899 Other long term (current) drug therapy: Secondary | ICD-10-CM | POA: Diagnosis not present

## 2019-09-14 DIAGNOSIS — M792 Neuralgia and neuritis, unspecified: Secondary | ICD-10-CM | POA: Diagnosis not present

## 2019-09-18 NOTE — Progress Notes (Signed)
Subjective: 59 year old female presents the office today for painful callus on the bottom, lateral aspect of her left foot which is painful with pressure in shoes.  She did have the area trimmed.  She denies recent injury or trauma otherwise. Denies any systemic complaints such as fevers, chills, nausea, vomiting. No acute changes since last appointment, and no other complaints at this time.   Objective: AAO x3, NAD DP/PT pulses palpable bilaterally, CRT less than 3 seconds Hyperkeratotic lesion left foot fifth metatarsal base plantarly.  No ongoing ulceration drainage or signs of infection.  Minimal edema there is no erythema or warmth.  There is no pain on course the peroneal tendon.  The tendon appears to be intact. No open lesions or pre-ulcerative lesions.  No pain with calf compression, swelling, warmth, erythema  Assessment: Hyperkeratotic lesion left/tendinitis  Plan: -All treatment options discussed with the patient including all alternatives, risks, complications.  -Skin was cleaned with alcohol and a mixture of quarter cc dexamethasone phosphate quarter cc Marcaine plain was infiltrated subdermally underneath the area the callus.  The callus was then debrided without any complications or bleeding.  Offloading pad dispensed. -Patient encouraged to call the office with any questions, concerns, change in symptoms.   Trula Slade DPM

## 2019-09-20 NOTE — Progress Notes (Deleted)
Primary Physician/Referring:  Patient, No Pcp Per  Patient ID: [name], female    DOB: 12-19-1960, 59 y.o.   MRN: [id number]  No chief complaint on file.  HPI:    [name]  is a 59 y.o. Caucasian female with bipolar disorder, polysubstance use, chronic pain syndrome, fibromyalgia, Rheumatoid arthritis, degenerative joint disease, last seen in the emergency room on 09/09/2018 with drug overdose and received Narcan and walked out [place].  Past cardiac history includes history of idiopathic pericarditis in 2015.  I had seen her 2 months ago for possible congestive heart failure, I felt her symptoms may related to fluid overload state from lack of mobility, venous insufficiency and poor eating habits.  She now presents for follow-up. Continues to have chronic dyspnea. *** She has had chronic bilateral leg edema, has had venous sclerotherapy bilateral lower extremity.  She has marked limitation in activity due to her underlying arthritis.  Has not used cocaine since Dec 26th 2020. Also smokes 1-1.5 packs of cigarettes a day.  Past Medical History:  Diagnosis Date  . Acute pericarditis    a. 04/2014 Cath: nl cors, EF 65-70%;  b. 04/2014 Echo: EF 65-70%, mild TR.  Marland Kitchen Allergy   . Anal fissure   . Anemia   . Anxiety   . Bipolar 1 disorder ([place])   . Chronic lower back pain   . Chronic pain   . Fibromyalgia   . GERD (gastroesophageal reflux disease)   . HLD (hyperlipidemia)   . Hypothyroidism   . RA (rheumatoid arthritis) ([place])    "all over"  . Substance abuse ([place])   . Wrist fracture    right   Past Surgical History:  Procedure Laterality Date  . ABDOMINAL HYSTERECTOMY  03/2004  . BACK SURGERY    . [place]; 1999  . COLONOSCOPY    . FOOT SURGERY Right 01/10/2016  . GASTRIC BYPASS  2001  . LEFT HEART CATHETERIZATION WITH CORONARY ANGIOGRAM N/A 04/23/2014   Normal coronaries and LVF  . OPEN REDUCTION INTERNAL FIXATION (ORIF) DISTAL RADIAL FRACTURE  Right 10/31/2017   Procedure: OPEN REDUCTION INTERNAL FIXATION (ORIF) DISTAL RADIAL FRACTURE;  Surgeon: Hiram Gash, MD;  Location: [facility];  Service: Orthopedics;  Laterality: Right;  . SHOULDER ARTHROSCOPY W/ ROTATOR CUFF REPAIR Right ~ 2011  . TARSAL METATARSAL FUSION WITH WEIL OSTEOTOMY Left 2013  . TONSILLECTOMY    . TOTAL SHOULDER ARTHROPLASTY     Social History   Tobacco Use  . Smoking status: Current Every Day Smoker    Packs/day: 1.00    Years: 34.00    Pack years: 34.00    Types: Cigarettes  . Smokeless tobacco: Never Used  Substance Use Topics  . Alcohol use: No   Marital Status: Divorced   ROS  Review of Systems  Constitution: Positive for malaise/fatigue.  Cardiovascular: Positive for dyspnea on exertion and leg swelling. Negative for chest pain.  Musculoskeletal: Positive for arthritis and back pain.  Gastrointestinal: Negative for melena.  Psychiatric/Behavioral: Positive for depression.   Objective  There were no vitals taken for this visit. There is no height or weight on file to calculate BMI.  Vitals with BMI 08/13/2019 07/24/2019 07/21/2019  Height 5\' 5"  5\' 5"  -  Weight 241 lbs 246 lbs 5 oz -  BMI 0000000 Q000111Q -  Systolic 98 123XX123 [id number]  Diastolic 61 77 71  Pulse 90 98 83     Physical Exam  Constitutional:  Moderately built  and morbidly obese, appears much more older than stated age, appears ill-looking.  No acute distress.  Neck:  Short neck and difficult to evaluate JVP  Cardiovascular: Normal rate, regular rhythm, normal heart sounds and intact distal pulses. Exam reveals no gallop.  No murmur heard. Pulses:      Carotid pulses are 2+ on the right side and 2+ on the left side.      Dorsalis pedis pulses are 2+ on the right side and 2+ on the left side.       Posterior tibial pulses are 2+ on the right side and 2+ on the left side.  Femoral and popliteal pulse difficult to feel due to patient's body habitus.  3+ right lower extremity edema and 2+ left  lower extremity edema bilateral above-the-knee, pitting.  Pulmonary/Chest: Effort normal and breath sounds normal.  Abdominal: Soft. Bowel sounds are normal.  Obese. Pannus present   Laboratory examination:   Recent Labs    05/11/19 1722 05/12/19 0147 07/21/19 0009  NA 136  --  139  K 4.4  --  4.1  CL 101  --  102  CO2 25  --  30  GLUCOSE 216*  --  94  BUN 14  --  8  CREATININE 1.27* 0.90 0.81  CALCIUM 8.5*  --  8.8*  GFRNONAA 47* >60 >60  GFRAA 55* >60 >60   CrCl cannot be calculated (Patient's most recent lab result is older than the maximum 21 days allowed.).  CMP Latest Ref Rng & Units 07/21/2019 05/12/2019 05/11/2019  Glucose 70 - 99 mg/dL 94 - 216(H)  BUN 6 - 20 mg/dL 8 - 14  Creatinine 0.44 - 1.00 mg/dL 0.81 0.90 1.27(H)  Sodium 135 - 145 mmol/L 139 - 136  Potassium 3.5 - 5.1 mmol/L 4.1 - 4.4  Chloride 98 - 111 mmol/L 102 - 101  CO2 22 - 32 mmol/L 30 - 25  Calcium 8.9 - 10.3 mg/dL 8.8(L) - 8.5(L)  Total Protein 6.5 - 8.1 g/dL - - 6.5  Total Bilirubin 0.3 - 1.2 mg/dL - - 0.4  Alkaline Phos 38 - 126 U/L - - 71  AST 15 - 41 U/L - - 64(H)  ALT 0 - 44 U/L - - 37   CBC Latest Ref Rng & Units 07/21/2019 05/11/2019 02/06/2018  WBC 4.0 - 10.5 K/uL 5.0 18.5(H) 7.1  Hemoglobin 12.0 - 15.0 g/dL 12.3 13.0 13.5  Hematocrit 36.0 - 46.0 % 38.1 41.2 39.4  Platelets 150 - 400 K/uL 273 336 302   Lipid Panel     Component Value Date/Time   CHOL 197 11/25/2013 0905   TRIG 87 11/25/2013 0905   HDL 61 11/25/2013 0905   CHOLHDL 3.2 11/25/2013 0905   VLDL 17 11/25/2013 0905   LDLCALC 119 (H) 11/25/2013 0905   HEMOGLOBIN A1C Lab Results  Component Value Date   HGBA1C 5.0 05/12/2019   MPG 96.8 05/12/2019   TSH Recent Labs    05/12/19 0147  TSH 2.641   External Results:  ***None  Medications and allergies   Allergies  Allergen Reactions  . Amitriptyline Palpitations    Increases heart rate     Current Outpatient Medications  Medication Instructions  . albuterol  (VENTOLIN HFA) 108 (90 Base) MCG/ACT inhaler 2 puffs, Inhalation, Every 4 hours PRN  . ALPRAZolam (XANAX) 0.5 mg, Oral, 2 times daily  . ALPRAZolam (XANAX) 1 mg, Oral, 2 times daily  . ARIPiprazole (ABILIFY) 2 mg, Oral, Daily at bedtime  .  Buprenorphine HCl-Naloxone HCl 8-2 MG FILM Sublingual, 3 times daily  . diclofenac (VOLTAREN) 50 mg, Oral, 2 times daily PRN  . doxycycline (VIBRAMYCIN) 100 mg, Oral, 2 times daily  . DULoxetine (CYMBALTA) 120 mg, Oral, Daily  . levocetirizine (XYZAL) 5 mg, Oral, Daily  . levothyroxine (SYNTHROID) 137 MCG tablet Oral, Daily before breakfast  . NONFORMULARY OR COMPOUNDED ITEM [facility]:  Achilles Tendonitis Cream - Diclofena 3%, Baclofen 2%, Bupivacaine 1%, Gabapentin 6%, Ibuprofen 3%, Pentoxifylline 3%. Apply 1-2 grams to affected area 3-4 times daily as needed for pain.  . pregabalin (LYRICA) 200 mg, 2 times daily  . spironolactone (ALDACTONE) 50 mg, Oral, Daily  . torsemide (DEMADEX) 20 mg, Oral, 2 times daily  . traZODone (DESYREL) 100 mg, Oral, Daily at bedtime    Radiology:  No results found.  Cardiac Studies:   Lower Venous Study 03/10/2018: Right: No evidence of deep vein thrombosis in the lower extremity. No indirect evidence of obstruction proximal to the inguinal ligament. No reflux was noted in the common femoral vein.  A cystic structure is found  in the popliteal fossa.  Left: No evidence of common femoral vein obstruction.   Echocardiogram 05/21/2019:  Left ventricle cavity is normal in size and wall thickness. Normal global  wall motion. Normal LV systolic function with EF 62%. Doppler evidence of  grade I (impaired) diastolic dysfunction, normal LAP. Left atrial cavity  is mildly dilated.  Trileaflet aortic valve. Increased velocity likely due to hyperdynamic LV.  No valvular stenosis. Trace aortic regurgitation.  Mild (Grade I) mitral regurgitation.  Mild tricuspid regurgitation. Estimated pulmonary artery systolic  pressure  is 22 mmHg.  EKG  *** 07/16/2019: Normal sinus rhythm at rate of 91 beats minute, normal axis.  No evidence of ischemia, normal EKG.  No significant change from  05/11/2019.  Assessment     ICD-10-CM   1. Dyspnea on exertion  R06.00   2. Venous insufficiency of both lower extremities  I87.2   3. Bilateral leg edema  R60.0      No orders of the defined types were placed in this encounter.  There are no discontinued medications.  Recommendations:   Jannell Triola  is a 59 y.o. Caucasian female with bipolar disorder, polysubstance use, chronic pain syndrome, fibromyalgia, Rheumatoid arthritis, degenerative joint disease, last seen in the emergency room on 09/09/2018 with drug overdose and received Narcan and walked out [place].    *** Her BNP was completely normal, cardiac examination is normal without any gallop or murmur, no JVD, hence I do not suspect congestive heart failure.  Her leg edema is related to lack of mobility, dependent edema and also she has varicose veins and has history of vein stripping/sclerotherapy in the past.    She may need to revisit this, I have referred her to be evaluated at [facility].     She has been abstinent from cocaine since Dec 26th, 2020.  I will discontinue furosemide and switch her to torsemide 20 mg p.o. twice daily.  She will continue with Aldactone 50 mg daily.  Recent BMP evaluated, renal function is normal.  I would like to see her back in 3 to 4 weeks for follow-up.  I will repeat BMP in 10 days.  I also called and spoke with her father regarding this.  Advised her to have nearly complete bedrest so she can mobilize the fluid.  Presently she has 3+ leg edema and it will be impossible for her to wear a  support stocking.  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  She will eventually need cardiac evaluation but I will wait till she is medically stable to exclude ischemia.   Adrian Prows, MD,  [facility] 09/21/2019, 12:34 PM [facility] [place] Pager: [phone] Office: [phone]

## 2019-09-21 ENCOUNTER — Ambulatory Visit: Payer: BC Managed Care – PPO | Admitting: Cardiology

## 2019-09-21 ENCOUNTER — Telehealth: Payer: Self-pay

## 2019-09-21 NOTE — Telephone Encounter (Signed)
Please cancel patient's appointment for today and call patient to re-schedule for next week. Thanks!

## 2019-09-28 ENCOUNTER — Ambulatory Visit: Payer: BC Managed Care – PPO | Admitting: Cardiology

## 2019-09-28 NOTE — Progress Notes (Deleted)
Primary Physician/Referring:  Patient, No Pcp Per  Patient ID: [name], female    DOB: November 01, 1960, 59 y.o.   MRN: [id number]  No chief complaint on file.  HPI:    [name]  is a 59 y.o. Caucasian female with bipolar disorder, polysubstance use, chronic pain syndrome, fibromyalgia, Rheumatoid arthritis, degenerative joint disease, last seen in the emergency room on 09/09/2018 with drug overdose and received Narcan and walked out [place].  Past cardiac history includes history of idiopathic pericarditis in 2015.  I had seen her 2 months ago for possible congestive heart failure, I felt her symptoms may related to fluid overload state from lack of mobility, venous insufficiency and poor eating habits.  She now presents for follow-up. Continues to have chronic dyspnea.  ***  She has had chronic bilateral leg edema, has had venous sclerotherapy bilateral lower extremity.  She has marked limitation in activity due to her underlying arthritis.  Has not used cocaine since Dec 26th 2020. Also smokes 1-1.5 packs of cigarettes a day.  Past Medical History:  Diagnosis Date  . Acute pericarditis    a. 04/2014 Cath: nl cors, EF 65-70%;  b. 04/2014 Echo: EF 65-70%, mild TR.  Marland Kitchen Allergy   . Anal fissure   . Anemia   . Anxiety   . Bipolar 1 disorder ([place])   . Chronic lower back pain   . Chronic pain   . Fibromyalgia   . GERD (gastroesophageal reflux disease)   . HLD (hyperlipidemia)   . Hypothyroidism   . RA (rheumatoid arthritis) ([place])    "all over"  . Substance abuse ([place])   . Wrist fracture    right   Past Surgical History:  Procedure Laterality Date  . ABDOMINAL HYSTERECTOMY  03/2004  . BACK SURGERY    . [place]; 1999  . COLONOSCOPY    . FOOT SURGERY Right 01/10/2016  . GASTRIC BYPASS  2001  . LEFT HEART CATHETERIZATION WITH CORONARY ANGIOGRAM N/A 04/23/2014   Normal coronaries and LVF  . OPEN REDUCTION INTERNAL FIXATION (ORIF) DISTAL RADIAL FRACTURE  Right 10/31/2017   Procedure: OPEN REDUCTION INTERNAL FIXATION (ORIF) DISTAL RADIAL FRACTURE;  Surgeon: Hiram Gash, MD;  Location: [facility];  Service: Orthopedics;  Laterality: Right;  . SHOULDER ARTHROSCOPY W/ ROTATOR CUFF REPAIR Right ~ 2011  . TARSAL METATARSAL FUSION WITH WEIL OSTEOTOMY Left 2013  . TONSILLECTOMY    . TOTAL SHOULDER ARTHROPLASTY     Family History  Problem Relation Age of Onset  . Thyroid disease Mother   . Colon polyps Father   . Colon polyps Sister   . Heart attack Maternal Grandfather   . Colon polyps Paternal Grandmother   . Colon cancer Neg Hx    Social History   Tobacco Use  . Smoking status: Current Every Day Smoker    Packs/day: 1.00    Years: 34.00    Pack years: 34.00    Types: Cigarettes  . Smokeless tobacco: Never Used  Substance Use Topics  . Alcohol use: No   Marital Status: Divorced  ROS  Review of Systems  Constitution: Positive for malaise/fatigue.  Cardiovascular: Positive for dyspnea on exertion and leg swelling. Negative for chest pain.  Musculoskeletal: Positive for arthritis and back pain.  Gastrointestinal: Negative for melena.  Psychiatric/Behavioral: Positive for depression.   Objective  There were no vitals taken for this visit. There is no height or weight on file to calculate BMI.  Vitals with BMI  08/13/2019 07/24/2019 07/21/2019  Height 5\' 5"  5\' 5"  -  Weight 241 lbs 246 lbs 5 oz -  BMI 0000000 Q000111Q -  Systolic 98 123XX123 123XX123  Diastolic 61 77 71  Pulse 90 98 83     Physical Exam  Constitutional:  Moderately built and morbidly obese, appears much more older than stated age, appears ill-looking.  No acute distress.  Neck:  Short neck and difficult to evaluate JVP  Cardiovascular: Normal rate, regular rhythm, normal heart sounds and intact distal pulses. Exam reveals no gallop.  No murmur heard. Pulses:      Carotid pulses are 2+ on the right side and 2+ on the left side.      Dorsalis pedis pulses are 2+ on the right side and  2+ on the left side.       Posterior tibial pulses are 2+ on the right side and 2+ on the left side.  Femoral and popliteal pulse difficult to feel due to patient's body habitus.  3+ right lower extremity edema and 2+ left lower extremity edema bilateral above-the-knee, pitting.  Pulmonary/Chest: Effort normal and breath sounds normal.  Abdominal: Soft. Bowel sounds are normal.  Obese. Pannus present   Laboratory examination:   Recent Labs    05/11/19 1722 05/12/19 0147 07/21/19 0009  NA 136  --  139  K 4.4  --  4.1  CL 101  --  102  CO2 25  --  30  GLUCOSE 216*  --  94  BUN 14  --  8  CREATININE 1.27* 0.90 0.81  CALCIUM 8.5*  --  8.8*  GFRNONAA 47* >60 >60  GFRAA 55* >60 >60   CrCl cannot be calculated (Patient's most recent lab result is older than the maximum 21 days allowed.).  CMP Latest Ref Rng & Units 07/21/2019 05/12/2019 05/11/2019  Glucose 70 - 99 mg/dL 94 - 216(H)  BUN 6 - 20 mg/dL 8 - 14  Creatinine 0.44 - 1.00 mg/dL 0.81 0.90 1.27(H)  Sodium 135 - 145 mmol/L 139 - 136  Potassium 3.5 - 5.1 mmol/L 4.1 - 4.4  Chloride 98 - 111 mmol/L 102 - 101  CO2 22 - 32 mmol/L 30 - 25  Calcium 8.9 - 10.3 mg/dL 8.8(L) - 8.5(L)  Total Protein 6.5 - 8.1 g/dL - - 6.5  Total Bilirubin 0.3 - 1.2 mg/dL - - 0.4  Alkaline Phos 38 - 126 U/L - - 71  AST 15 - 41 U/L - - 64(H)  ALT 0 - 44 U/L - - 37   CBC Latest Ref Rng & Units 07/21/2019 05/11/2019 02/06/2018  WBC 4.0 - 10.5 K/uL 5.0 18.5(H) 7.1  Hemoglobin 12.0 - 15.0 g/dL 12.3 13.0 13.5  Hematocrit 36.0 - 46.0 % 38.1 41.2 39.4  Platelets 150 - 400 K/uL 273 336 302   Lipid Panel     Component Value Date/Time   CHOL 197 11/25/2013 0905   TRIG 87 11/25/2013 0905   HDL 61 11/25/2013 0905   CHOLHDL 3.2 11/25/2013 0905   VLDL 17 11/25/2013 0905   LDLCALC 119 (H) 11/25/2013 0905   HEMOGLOBIN A1C Lab Results  Component Value Date   HGBA1C 5.0 05/12/2019   MPG 96.8 05/12/2019   TSH Recent Labs    05/12/19 0147  TSH 2.641    External Results:  ***None  Medications and allergies   Allergies  Allergen Reactions  . Amitriptyline Palpitations    Increases heart rate     Current Outpatient Medications  Medication  Instructions  . albuterol (VENTOLIN HFA) 108 (90 Base) MCG/ACT inhaler 2 puffs, Inhalation, Every 4 hours PRN  . ALPRAZolam (XANAX) 0.5 mg, Oral, 2 times daily  . ALPRAZolam (XANAX) 1 mg, Oral, 2 times daily  . ARIPiprazole (ABILIFY) 2 mg, Oral, Daily at bedtime  . Buprenorphine HCl-Naloxone HCl 8-2 MG FILM Sublingual, 3 times daily  . diclofenac (VOLTAREN) 50 mg, Oral, 2 times daily PRN  . doxycycline (VIBRAMYCIN) 100 mg, Oral, 2 times daily  . DULoxetine (CYMBALTA) 120 mg, Oral, Daily  . levocetirizine (XYZAL) 5 mg, Oral, Daily  . levothyroxine (SYNTHROID) 137 MCG tablet Oral, Daily before breakfast  . NONFORMULARY OR COMPOUNDED ITEM [facility]:  Achilles Tendonitis Cream - Diclofena 3%, Baclofen 2%, Bupivacaine 1%, Gabapentin 6%, Ibuprofen 3%, Pentoxifylline 3%. Apply 1-2 grams to affected area 3-4 times daily as needed for pain.  . pregabalin (LYRICA) 200 mg, 2 times daily  . spironolactone (ALDACTONE) 50 mg, Oral, Daily  . torsemide (DEMADEX) 20 mg, Oral, 2 times daily  . traZODone (DESYREL) 100 mg, Oral, Daily at bedtime    Radiology:  No results found.  Cardiac Studies:   Lower Venous Study 03/10/2018: Right: No evidence of deep vein thrombosis in the lower extremity. No indirect evidence of obstruction proximal to the inguinal ligament. No reflux was noted in the common femoral vein.  A cystic structure is found  in the popliteal fossa.  Left: No evidence of common femoral vein obstruction.   Echocardiogram 05/21/2019:  Left ventricle cavity is normal in size and wall thickness. Normal global  wall motion. Normal LV systolic function with EF 62%. Doppler evidence of  grade I (impaired) diastolic dysfunction, normal LAP. Left atrial cavity  is mildly dilated.   Trileaflet aortic valve. Increased velocity likely due to hyperdynamic LV.  No valvular stenosis. Trace aortic regurgitation.  Mild (Grade I) mitral regurgitation.  Mild tricuspid regurgitation. Estimated pulmonary artery systolic pressure  is 22 mmHg.  EKG   ***  07/16/2019: Normal sinus rhythm at rate of 91 beats minute, normal axis.  No evidence of ischemia, normal EKG.  No significant change from  05/11/2019.  Assessment     ICD-10-CM   1. Dyspnea on exertion  R06.00   2. Venous insufficiency of both lower extremities  I87.2   3. Bilateral leg edema  R60.0      No orders of the defined types were placed in this encounter.  There are no discontinued medications.  Recommendations:   [name]  is a 59 y.o. Caucasian female with bipolar disorder, polysubstance use, chronic pain syndrome, fibromyalgia, Rheumatoid arthritis, degenerative joint disease, last seen in the emergency room on 09/09/2018 with drug overdose and received Narcan and walked out [place].     *** Her BNP was completely normal, cardiac examination is normal without any gallop or murmur, no JVD, hence I do not suspect congestive heart failure.  Her leg edema is related to lack of mobility, dependent edema and also she has varicose veins and has history of vein stripping/sclerotherapy in the past.    She may need to revisit this, I have referred her to be evaluated at [facility].     She has been abstinent from cocaine since Dec 26th, 2020.  I will discontinue furosemide and switch her to torsemide 20 mg p.o. twice daily.  She will continue with Aldactone 50 mg daily.  Recent BMP evaluated, renal function is normal.  I would like to see her back in 3 to 4  weeks for follow-up.  I will repeat BMP in 10 days.  I also called and spoke with her father regarding this.  Advised her to have nearly complete bedrest so she can mobilize the fluid.  Presently she has 3+ leg edema and it will be impossible for her to wear  a support stocking.  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  She will eventually need cardiac evaluation but I will wait till she is medically stable to exclude ischemia.   Adrian Prows, MD, [facility] 09/28/2019, 12:34 PM [facility] [place] Pager: [phone] Office: [phone]

## 2019-10-04 ENCOUNTER — Emergency Department (HOSPITAL_BASED_OUTPATIENT_CLINIC_OR_DEPARTMENT_OTHER): Payer: BC Managed Care – PPO

## 2019-10-04 ENCOUNTER — Other Ambulatory Visit: Payer: Self-pay

## 2019-10-04 ENCOUNTER — Emergency Department (HOSPITAL_BASED_OUTPATIENT_CLINIC_OR_DEPARTMENT_OTHER)
Admission: EM | Admit: 2019-10-04 | Discharge: 2019-10-04 | Disposition: A | Discharge status: Home / Self Care | Payer: BC Managed Care – PPO | Attending: Emergency Medicine | Admitting: Emergency Medicine

## 2019-10-04 ENCOUNTER — Encounter (HOSPITAL_BASED_OUTPATIENT_CLINIC_OR_DEPARTMENT_OTHER): Payer: Self-pay | Admitting: *Deleted

## 2019-10-04 DIAGNOSIS — M79605 Pain in left leg: Secondary | ICD-10-CM | POA: Diagnosis not present

## 2019-10-04 DIAGNOSIS — F17210 Nicotine dependence, cigarettes, uncomplicated: Secondary | ICD-10-CM | POA: Diagnosis not present

## 2019-10-04 DIAGNOSIS — E039 Hypothyroidism, unspecified: Secondary | ICD-10-CM | POA: Insufficient documentation

## 2019-10-04 DIAGNOSIS — Z888 Allergy status to other drugs, medicaments and biological substances status: Secondary | ICD-10-CM | POA: Diagnosis not present

## 2019-10-04 DIAGNOSIS — M79604 Pain in right leg: Secondary | ICD-10-CM | POA: Insufficient documentation

## 2019-10-04 DIAGNOSIS — E785 Hyperlipidemia, unspecified: Secondary | ICD-10-CM | POA: Insufficient documentation

## 2019-10-04 DIAGNOSIS — R600 Localized edema: Secondary | ICD-10-CM

## 2019-10-04 DIAGNOSIS — Z79899 Other long term (current) drug therapy: Secondary | ICD-10-CM | POA: Insufficient documentation

## 2019-10-04 DIAGNOSIS — M5136 Other intervertebral disc degeneration, lumbar region: Secondary | ICD-10-CM | POA: Diagnosis not present

## 2019-10-04 DIAGNOSIS — I824Y1 Acute embolism and thrombosis of unspecified deep veins of right proximal lower extremity: Secondary | ICD-10-CM

## 2019-10-04 DIAGNOSIS — R2243 Localized swelling, mass and lump, lower limb, bilateral: Secondary | ICD-10-CM | POA: Insufficient documentation

## 2019-10-04 LAB — AMMONIA: Ammonia: 20 umol/L (ref 9–35)

## 2019-10-04 LAB — CBC WITH DIFFERENTIAL/PLATELET
Abs Immature Granulocytes: 0.01 10*3/uL (ref 0.00–0.07)
Basophils Absolute: 0.1 10*3/uL (ref 0.0–0.1)
Basophils Relative: 1 %
Eosinophils Absolute: 0.1 10*3/uL (ref 0.0–0.5)
Eosinophils Relative: 2 %
HCT: 40.1 % (ref 36.0–46.0)
Hemoglobin: 13.1 g/dL (ref 12.0–15.0)
Immature Granulocytes: 0 %
Lymphocytes Relative: 38 %
Lymphs Abs: 2.6 10*3/uL (ref 0.7–4.0)
MCH: 31.3 pg (ref 26.0–34.0)
MCHC: 32.7 g/dL (ref 30.0–36.0)
MCV: 95.9 fL (ref 80.0–100.0)
Monocytes Absolute: 0.6 10*3/uL (ref 0.1–1.0)
Monocytes Relative: 9 %
Neutro Abs: 3.4 10*3/uL (ref 1.7–7.7)
Neutrophils Relative %: 50 %
Platelets: 232 10*3/uL (ref 150–400)
RBC: 4.18 MIL/uL (ref 3.87–5.11)
RDW: 13.2 % (ref 11.5–15.5)
WBC: 6.8 10*3/uL (ref 4.0–10.5)
nRBC: 0 % (ref 0.0–0.2)

## 2019-10-04 LAB — URINALYSIS, ROUTINE W REFLEX MICROSCOPIC
Bilirubin Urine: NEGATIVE
Glucose, UA: NEGATIVE mg/dL
Hgb urine dipstick: NEGATIVE
Ketones, ur: NEGATIVE mg/dL
Nitrite: NEGATIVE
Protein, ur: NEGATIVE mg/dL
Specific Gravity, Urine: 1.015 (ref 1.005–1.030)
pH: 7.5 (ref 5.0–8.0)

## 2019-10-04 LAB — COMPREHENSIVE METABOLIC PANEL WITH GFR
ALT: 20 U/L (ref 0–44)
AST: 25 U/L (ref 15–41)
Albumin: 3.5 g/dL (ref 3.5–5.0)
Alkaline Phosphatase: 78 U/L (ref 38–126)
Anion gap: 10 (ref 5–15)
BUN: 10 mg/dL (ref 6–20)
CO2: 29 mmol/L (ref 22–32)
Calcium: 8.8 mg/dL — ABNORMAL LOW (ref 8.9–10.3)
Chloride: 99 mmol/L (ref 98–111)
Creatinine, Ser: 0.84 mg/dL (ref 0.44–1.00)
GFR calc Af Amer: >60 mL/min
GFR calc non Af Amer: >60 mL/min
Glucose, Bld: 96 mg/dL (ref 70–99)
Potassium: 3.9 mmol/L (ref 3.5–5.1)
Sodium: 138 mmol/L (ref 135–145)
Total Bilirubin: 0.5 mg/dL (ref 0.3–1.2)
Total Protein: 6.4 g/dL — ABNORMAL LOW (ref 6.5–8.1)

## 2019-10-04 LAB — RAPID URINE DRUG SCREEN, HOSP PERFORMED
Amphetamines: NOT DETECTED
Barbiturates: NOT DETECTED
Benzodiazepines: POSITIVE — AB
Cocaine: NOT DETECTED
Opiates: NOT DETECTED
Tetrahydrocannabinol: POSITIVE — AB

## 2019-10-04 LAB — URINALYSIS, MICROSCOPIC (REFLEX): RBC / HPF: NONE SEEN RBC/hpf (ref 0–5)

## 2019-10-04 NOTE — ED Provider Notes (Signed)
[facility] EMERGENCY DEPARTMENT Provider Note   CSN: [id number] Arrival date & time: 10/04/19  1834     History Chief Complaint  Patient presents with  . Leg Pain    [name] is a 59 y.o. female who presents for leg swelling. She has chronic peripheral edema and pain , R> L. She was seen by her pcp today and given IM abx and oral abx for tx for cellulitis. She has noticed some numbness on the the distrubution of th iliotibial band region of the R leg for the past few weeks. She denies any new weakness. She has chronic back pain and has had multiple surgeries to her back. She denies urinary/bowel incontinence, saddle anesthesia. Fevers or chills.   HPI     Past Medical History:  Diagnosis Date  . Acute pericarditis    a. 04/2014 Cath: nl cors, EF 65-70%;  b. 04/2014 Echo: EF 65-70%, mild TR.  Marland Kitchen Allergy   . Anal fissure   . Anemia   . Anxiety   . Bipolar 1 disorder ([place])   . Chronic lower back pain   . Chronic pain   . Fibromyalgia   . GERD (gastroesophageal reflux disease)   . HLD (hyperlipidemia)   . Hypothyroidism   . RA (rheumatoid arthritis) ([place])    "all over"  . Substance abuse ([place])   . Wrist fracture    right    Patient Active Problem List   Diagnosis Date Noted  . Chronic venous insufficiency 08/13/2019  . Lymphedema 08/13/2019  . Acute encephalopathy 05/12/2019  . Rheumatoid arthritis involving multiple sites with positive rheumatoid factor ([place]) 05/21/2016  . Primary osteoarthritis of both hands 05/21/2016  . Primary osteoarthritis of both feet 05/21/2016  . Primary osteoarthritis of both knees 05/21/2016  . Primary osteoarthritis of right shoulder 05/21/2016  . Acute pericarditis 04/23/2014  . Rheumatoid arthritis ([place]) 04/11/2012  . History of narcotic addiction ([place]) 03/31/2012  . Fibromyalgia 03/31/2012    Past Surgical History:  Procedure Laterality Date  . ABDOMINAL HYSTERECTOMY  03/2004  . BACK SURGERY    . [place]; 1999  . COLONOSCOPY    . FOOT SURGERY Right 01/10/2016  . GASTRIC BYPASS  2001  . LEFT HEART CATHETERIZATION WITH CORONARY ANGIOGRAM N/A 04/23/2014   Normal coronaries and LVF  . OPEN REDUCTION INTERNAL FIXATION (ORIF) DISTAL RADIAL FRACTURE Right 10/31/2017   Procedure: OPEN REDUCTION INTERNAL FIXATION (ORIF) DISTAL RADIAL FRACTURE;  Surgeon: Hiram Gash, MD;  Location: [facility];  Service: Orthopedics;  Laterality: Right;  . SHOULDER ARTHROSCOPY W/ ROTATOR CUFF REPAIR Right ~ 2011  . TARSAL METATARSAL FUSION WITH WEIL OSTEOTOMY Left 2013  . TONSILLECTOMY    . TOTAL SHOULDER ARTHROPLASTY       OB History   No obstetric history on file.     Family History  Problem Relation Age of Onset  . Thyroid disease Mother   . Colon polyps Father   . Colon polyps Sister   . Heart attack Maternal Grandfather   . Colon polyps Paternal Grandmother   . Colon cancer Neg Hx     Social History   Tobacco Use  . Smoking status: Current Every Day Smoker    Packs/day: 1.00    Years: 34.00    Pack years: 34.00    Types: Cigarettes  . Smokeless tobacco: Never Used  Substance Use Topics  . Alcohol use: No  . Drug use: Yes    Types: Cocaine  Comment: Has not used cocaine since 05/12/2019    Home Medications Prior to Admission medications   Medication Sig Start Date End Date Taking? Authorizing Provider  Buprenorphine HCl-Naloxone HCl 8-2 MG FILM  09/12/19  Yes [provider]  albuterol (VENTOLIN HFA) 108 (90 Base) MCG/ACT inhaler Inhale 2 puffs into the lungs every 4 (four) hours as needed. 02/22/19   [provider]  ALPRAZolam Duanne Moron) 0.5 MG tablet Take 0.5 mg by mouth 2 (two) times daily.  11/22/18   [provider]  ALPRAZolam Duanne Moron) 1 MG tablet Take 1 mg by mouth 2 (two) times daily. 07/23/19   [provider]  ARIPiprazole (ABILIFY) 2 MG tablet Take 2 mg by mouth at bedtime. 05/05/19   [provider]  ARIPiprazole (ABILIFY) 5 MG tablet  Take 5 mg by mouth daily. 09/03/19   [provider]  Buprenorphine HCl-Naloxone HCl 8-2 MG FILM Place under the tongue 3 (three) times daily. 07/25/19   [provider]  diclofenac (VOLTAREN) 50 MG EC tablet Take 50 mg by mouth 2 (two) times daily as needed. 01/25/19   [provider]  doxycycline (VIBRAMYCIN) 100 MG capsule Take 1 capsule (100 mg total) by mouth 2 (two) times daily. 07/21/19   Orpah Greek, MD  DULoxetine (CYMBALTA) 60 MG capsule Take 120 mg by mouth daily.    [provider]  levocetirizine (XYZAL) 5 MG tablet Take 5 mg by mouth daily. 01/26/19   [provider]  levothyroxine (SYNTHROID) 137 MCG tablet Take by mouth daily before breakfast.     [provider]  NONFORMULARY OR COMPOUNDED ITEM  Apothecary:  [facility] Tendonitis Cream - Diclofena 3%, Baclofen 2%, Bupivacaine 1%, Gabapentin 6%, Ibuprofen 3%, Pentoxifylline 3%. Apply 1-2 grams to affected area 3-4 times daily as needed for pain. 04/17/19   Trula Slade, DPM  pregabalin (LYRICA) 200 MG capsule Take 200 mg by mouth 2 (two) times daily.    [provider]  spironolactone (ALDACTONE) 50 MG tablet Take 1 tablet (50 mg total) by mouth daily. Patient not taking: Reported on 08/13/2019 05/16/19 08/14/19  Adrian Prows, MD  torsemide (DEMADEX) 20 MG tablet Take 1 tablet (20 mg total) by mouth 2 (two) times daily. 07/24/19 09/22/19  Adrian Prows, MD  traZODone (DESYREL) 100 MG tablet Take 100 mg by mouth at bedtime.     [provider]    Allergies    Amitriptyline  Review of Systems   Review of Systems Ten systems reviewed and are negative for acute change, except as noted in the HPI.   Physical Exam Updated Vital Signs BP 113/71 (BP Location: Right Arm)   Pulse (!) 57   Temp 97.6 F (36.4 C) (Oral)   Resp 18   Ht 5\' 5"  (1.651 m)   Wt 112.6 kg   SpO2 100%   BMI 41.30 kg/m   Physical Exam Vitals and nursing note reviewed.    Constitutional:      General: She is not in acute distress.    Appearance: She is well-developed. She is not diaphoretic.  HENT:     Head: Normocephalic and atraumatic.  Eyes:     General: No scleral icterus.    Conjunctiva/sclera: Conjunctivae normal.  Cardiovascular:     Rate and Rhythm: Normal rate and regular rhythm.     Heart sounds: Normal heart sounds. No murmur. No friction rub. No gallop.   Pulmonary:     Effort: Pulmonary effort is normal. No respiratory distress.  Breath sounds: Normal breath sounds.  Abdominal:     General: Bowel sounds are normal. There is no distension.     Palpations: Abdomen is soft. There is no mass.     Tenderness: There is no abdominal tenderness. There is no guarding.  Musculoskeletal:     Cervical back: Normal range of motion.     Right lower leg: Edema present.     Left lower leg: Edema present.     Comments: 3+ edema BL LE R > L  BL erythema of the shins RR> L, Equal tactile temperature BL 2+ DP pulses  Skin:    General: Skin is warm and dry.  Neurological:     Mental Status: She is oriented to person, place, and time. She is lethargic.  Psychiatric:        Behavior: Behavior normal.     ED Results / Procedures / Treatments   Labs (all labs ordered are listed, but only abnormal results are displayed) Labs Reviewed  COMPREHENSIVE METABOLIC PANEL - Abnormal; Notable for the following components:      Result Value   Calcium 8.8 (*)    Total Protein 6.4 (*)    All other components within normal limits  URINALYSIS, ROUTINE W REFLEX MICROSCOPIC - Abnormal; Notable for the following components:   Leukocytes,Ua TRACE (*)    All other components within normal limits  RAPID URINE DRUG SCREEN, HOSP PERFORMED - Abnormal; Notable for the following components:   Benzodiazepines POSITIVE (*)    Tetrahydrocannabinol POSITIVE (*)    All other components within normal limits  URINALYSIS, MICROSCOPIC (REFLEX) - Abnormal; Notable for the  following components:   Bacteria, UA RARE (*)    All other components within normal limits  CBC WITH DIFFERENTIAL/PLATELET  AMMONIA    EKG None  Radiology DG Lumbar Spine Complete  Result Date: 10/04/2019 CLINICAL DATA:  Bilateral leg pain. EXAM: LUMBAR SPINE - COMPLETE 4+ VIEW COMPARISON:  None. FINDINGS: There is no evidence of lumbar spine fracture. There is 3 mm retrolisthesis of L1 on L2. Mild-to-moderate degenerative disc and joint disease is noted in the lumbar spine. IMPRESSION: 1. No acute osseous abnormality. 2. Mild to moderate degenerative disc disease and joint disease. Electronically Signed   By: Zerita Boers M.D.   On: 10/04/2019 20:56    Procedures Procedures (including critical care time)  Medications Ordered in ED Medications - No data to display  ED Course  I have reviewed the triage vital signs and the nursing notes.  Pertinent labs & imaging results that were available during my care of the patient were reviewed by me and considered in my medical decision making (see chart for details).    MDM Rules/Calculators/A&P                      59 year old female here with chronic edema and leg pain. Differential includes lymphedema, cellulitis, DVT.  She reports that she was treated with IM antibiotics and just started oral antibiotics given by her doctor today.  She does not have an elevated white blood cell count on my evaluation, she is afebrile.  I personally ordered interpreted reviewed the labs which also show CMP without significant abnormality, urine analysis negative for infection.  Her UDS is positive for marijuana and benzodiazepines.  We will have the patient continue with her antibiotics.  I have ordered a return ultrasound for tomorrow morning.  She is complaining of numbness on the side of her leg however she  has no red flag symptoms and may follow-up with her primary care physician.  I ordered and reviewed images of the lumbar film which shows some  degenerative changes.  She appears otherwise appropriate for discharge at this time. Final Clinical Impression(s) / ED Diagnoses Final diagnoses:  Leg edema    Rx / DC Orders ED Discharge Orders         Ordered    US Venous Img Lower Unilateral Right  Status:  Canceled     10/04/19 2247    US Venous Img Lower Unilateral Right (DVT)     10/04/19 2247           Margarita Mail, PA-C 10/05/19 1825    Long, [facility], MD 10/10/19 [phone]

## 2019-10-04 NOTE — ED Notes (Signed)
Pt states " im going out to smoke while I wait"

## 2019-10-04 NOTE — Discharge Instructions (Addendum)
Your work-up showed no abnormalities.  You do have some degenerative changes of your back.  If you are having numbness of your leg you will need to follow-up with her primary care physician who may order further imaging. I have ordered an ultrasound of your of your right leg to make sure you do not have a clot.  Should return to [facility] tomorrow for an ultrasound tomorrow.  Please take your home medications for pain and discomfort.

## 2019-10-04 NOTE — ED Triage Notes (Signed)
Pt c/o increased bil leg swelling x 2 weeks

## 2019-10-05 ENCOUNTER — Ambulatory Visit (HOSPITAL_BASED_OUTPATIENT_CLINIC_OR_DEPARTMENT_OTHER)
Admission: RE | Admit: 2019-10-05 | Discharge: 2019-10-05 | Disposition: A | Discharge status: Home / Self Care | Payer: BC Managed Care – PPO | Source: Ambulatory Visit | Attending: Emergency Medicine | Admitting: Emergency Medicine

## 2019-10-05 DIAGNOSIS — I824Y1 Acute embolism and thrombosis of unspecified deep veins of right proximal lower extremity: Secondary | ICD-10-CM | POA: Diagnosis not present

## 2019-10-05 DIAGNOSIS — R600 Localized edema: Secondary | ICD-10-CM | POA: Diagnosis not present

## 2019-10-10 ENCOUNTER — Ambulatory Visit (INDEPENDENT_AMBULATORY_CARE_PROVIDER_SITE_OTHER): Payer: BC Managed Care – PPO | Admitting: Nurse Practitioner

## 2019-10-10 ENCOUNTER — Encounter (INDEPENDENT_AMBULATORY_CARE_PROVIDER_SITE_OTHER): Payer: BC Managed Care – PPO

## 2019-10-11 DIAGNOSIS — M170 Bilateral primary osteoarthritis of knee: Secondary | ICD-10-CM | POA: Diagnosis not present

## 2019-10-23 NOTE — Progress Notes (Signed)
Primary Physician/Referring:  System, Pcp Not In  Patient ID: [name], female    DOB: Dec 21, 1960, 59 y.o.   MRN: [id number]  Chief Complaint  Patient presents with   Follow-up    3 week   Edema    leg   Results   HPI:    [name]  is a 59 y.o. Caucasian female with bipolar disorder, polysubstance use, chronic pain syndrome, fibromyalgia, Rheumatoid arthritis, degenerative joint disease, last seen in the emergency room on 09/09/2018 with drug overdose and received Narcan and walked out [place].  Past cardiac history includes history of idiopathic pericarditis in 2015.  I felt her symptoms may related to fluid overload state from lack of mobility, venous insufficiency and poor eating habits.  She now presents for 3 month follow-up. Continues to have chronic dyspnea.  She has had chronic bilateral leg edema, right leg worse and recently finished antibiotic therapy for superficial cellulitis. She has had venous sclerotherapy bilateral lower extremity.  She has marked limitation in activity due to her underlying arthritis.  Has not used cocaine since Dec 26th 2020. Also smokes 1 pack of cigarettes a day but is trying to quit.  Past Medical History:  Diagnosis Date   Acute pericarditis    a. 04/2014 Cath: nl cors, EF 65-70%;  b. 04/2014 Echo: EF 65-70%, mild TR.   Allergy    Anal fissure    Anemia    Anxiety    Bipolar 1 disorder ([facility])    Chronic lower back pain    Chronic pain    Fibromyalgia    GERD (gastroesophageal reflux disease)    HLD (hyperlipidemia)    Hypothyroidism    RA (rheumatoid arthritis) ([place])    "all over"   Substance abuse ([place])    Wrist fracture    right   Past Surgical History:  Procedure Laterality Date   ABDOMINAL HYSTERECTOMY  03/2004   [address]; 1999   COLONOSCOPY     FOOT SURGERY Right 01/10/2016   GASTRIC BYPASS  2001   LEFT HEART CATHETERIZATION WITH CORONARY ANGIOGRAM  N/A 04/23/2014   Normal coronaries and LVF   OPEN REDUCTION INTERNAL FIXATION (ORIF) DISTAL RADIAL FRACTURE Right 10/31/2017   Procedure: OPEN REDUCTION INTERNAL FIXATION (ORIF) DISTAL RADIAL FRACTURE;  Surgeon: Hiram Gash, MD;  Location: [facility];  Service: Orthopedics;  Laterality: Right;   SHOULDER ARTHROSCOPY W/ ROTATOR CUFF REPAIR Right ~ 2011   TARSAL METATARSAL FUSION WITH WEIL OSTEOTOMY Left 2013   TONSILLECTOMY     TOTAL SHOULDER ARTHROPLASTY     Family History  Problem Relation Age of Onset   Thyroid disease Mother    Colon polyps Father    Colon polyps Sister    Heart attack Maternal Grandfather    Colon polyps Paternal Grandmother    Colon cancer Neg Hx    Social History   Tobacco Use   Smoking status: Current Every Day Smoker    Packs/day: 1.00    Years: 34.00    Pack years: 34.00    Types: Cigarettes   Smokeless tobacco: Never Used  Substance Use Topics   Alcohol use: No   Marital Status: Divorced  ROS  Review of Systems  Cardiovascular: Positive for leg swelling. Negative for chest pain.  Musculoskeletal: Positive for arthritis and back pain.  Gastrointestinal: Negative for melena.  Psychiatric/Behavioral: Positive for depression.   Objective  Blood pressure 108/74, pulse 92, resp. rate  16, height 5\' 5"  (1.651 m), weight 230 lb (104.3 kg), SpO2 100 %. Body mass index is 38.27 kg/m.  Vitals with BMI 10/24/2019 10/04/2019 10/04/2019  Height 5\' 5"  - -  Weight 230 lbs - -  BMI 94.76 - -  Systolic 546 503 546  Diastolic 74 72 71  Pulse 92 60 57     Physical Exam  Constitutional:  Moderately built and morbidly obese, appears much more older than stated age, appears ill-looking.  No acute distress.  Neck:  Short neck and difficult to evaluate JVP  Cardiovascular: Normal rate, regular rhythm, normal heart sounds and intact distal pulses. Exam reveals no gallop.  No murmur heard. Pulses:      Carotid pulses are 2+ on the right side and 2+ on the  left side.      Dorsalis pedis pulses are 2+ on the right side and 2+ on the left side.       Posterior tibial pulses are 2+ on the right side and 2+ on the left side.  Femoral and popliteal pulse difficult to feel due to patient's body habitus.  3+ right lower extremity edema and 2+ left lower extremity edema bilateral above-the-knee, pitting. Right leg superficial cellulitis changes noted. No skin breakdown.  Pulmonary/Chest: Effort normal and breath sounds normal.  Abdominal: Soft. Bowel sounds are normal.  Obese. Pannus present   Laboratory examination:   Recent Labs    05/11/19 1722 05/11/19 1722 05/12/19 0147 07/21/19 0009 10/04/19 2117  NA 136  --   --  139 138  K 4.4  --   --  4.1 3.9  CL 101  --   --  102 99  CO2 25  --   --  30 29  GLUCOSE 216*  --   --  94 96  BUN 14  --   --  8 10  CREATININE 1.27*   < > 0.90 0.81 0.84  CALCIUM 8.5*  --   --  8.8* 8.8*  GFRNONAA 47*   < > >60 >60 >60  GFRAA 55*   < > >60 >60 >60   < > = values in this interval not displayed.   estimated creatinine clearance is 88.5 mL/min (by C-G formula based on SCr of 0.84 mg/dL).  CMP Latest Ref Rng & Units 10/04/2019 07/21/2019 05/12/2019  Glucose 70 - 99 mg/dL 96 94 -  BUN 6 - 20 mg/dL 10 8 -  Creatinine 0.44 - 1.00 mg/dL 0.84 0.81 0.90  Sodium 135 - 145 mmol/L 138 139 -  Potassium 3.5 - 5.1 mmol/L 3.9 4.1 -  Chloride 98 - 111 mmol/L 99 102 -  CO2 22 - 32 mmol/L 29 30 -  Calcium 8.9 - 10.3 mg/dL 8.8(L) 8.8(L) -  Total Protein 6.5 - 8.1 g/dL 6.4(L) - -  Total Bilirubin 0.3 - 1.2 mg/dL 0.5 - -  Alkaline Phos 38 - 126 U/L 78 - -  AST 15 - 41 U/L 25 - -  ALT 0 - 44 U/L 20 - -   CBC Latest Ref Rng & Units 10/04/2019 07/21/2019 05/11/2019  WBC 4.0 - 10.5 K/uL 6.8 5.0 18.5(H)  Hemoglobin 12.0 - 15.0 g/dL 13.1 12.3 13.0  Hematocrit 36.0 - 46.0 % 40.1 38.1 41.2  Platelets 150 - 400 K/uL 232 273 336   Lipid Panel     Component Value Date/Time   CHOL 197 11/25/2013 0905   TRIG 87 11/25/2013  0905   HDL 61 11/25/2013 0905  CHOLHDL 3.2 11/25/2013 0905   VLDL 17 11/25/2013 0905   LDLCALC 119 (H) 11/25/2013 0905   HEMOGLOBIN A1C Lab Results  Component Value Date   HGBA1C 5.0 05/12/2019   MPG 96.8 05/12/2019   TSH Recent Labs    05/12/19 0147  TSH 2.641   Medications and allergies   Allergies  Allergen Reactions   Amitriptyline Palpitations    Increases heart rate    Current Outpatient Medications  Medication Instructions   albuterol (VENTOLIN HFA) 108 (90 Base) MCG/ACT inhaler 2 puffs, Inhalation, Every 4 hours PRN   ALPRAZolam (XANAX) 1 mg, Oral, 2 times daily   ARIPiprazole (ABILIFY) 5 mg, Oral, Daily   Buprenorphine HCl-Naloxone HCl 8-2 MG FILM Sublingual, 3 times daily   diclofenac (VOLTAREN) 50 mg, Oral, 2 times daily PRN   DULoxetine (CYMBALTA) 120 mg, Oral, Daily, 2 60mg s in am   levocetirizine (XYZAL) 5 mg, Oral, Daily   levothyroxine (SYNTHROID) 137 MCG tablet Oral, Daily before breakfast   pregabalin (LYRICA) 200 mg, 2 times daily   torsemide (DEMADEX) 20 mg, Oral, 2 times daily   traZODone (DESYREL) 100 mg, Oral, Daily at bedtime   Medications Discontinued During This Encounter  Medication Reason   Buprenorphine HCl-Naloxone HCl 8-2 MG FILM Error   NONFORMULARY OR COMPOUNDED ITEM No longer needed (for PRN medications)   spironolactone (ALDACTONE) 50 MG tablet Side effect (s)   ALPRAZolam (XANAX) 0.5 MG tablet Patient has not taken in last 30 days   ARIPiprazole (ABILIFY) 2 MG tablet Change in therapy   doxycycline (VIBRAMYCIN) 100 MG capsule Completed Course    Radiology:  No results found.  Cardiac Studies:   Lower Venous Study 03/10/2018: Right: No evidence of deep vein thrombosis in the lower extremity. No indirect evidence of obstruction proximal to the inguinal ligament. No reflux was noted in the common femoral vein.  A cystic structure is found  in the popliteal fossa.  Left: No evidence of common femoral vein  obstruction.   Echocardiogram 05/21/2019:  Left ventricle cavity is normal in size and wall thickness. Normal global  wall motion. Normal LV systolic function with EF 62%. Doppler evidence of  grade I (impaired) diastolic dysfunction, normal LAP. Left atrial cavity  is mildly dilated.  Trileaflet aortic valve. Increased velocity likely due to hyperdynamic LV.  No valvular stenosis. Trace aortic regurgitation.  Mild (Grade I) mitral regurgitation.  Mild tricuspid regurgitation. Estimated pulmonary artery systolic pressure  is 22 mmHg.   US Venous Img Lower Unilateral Right 10/05/2019: 1. No evidence of deep venous thrombosis in the right lower extremity. Note that visualization of the calf veins on the right is somewhat limited due to soft tissue edema in the right lower extremity. Left common femoral vein also patent. 2. Fluid collection in the right popliteal fossa region measuring 6.2 x 2.1 x 3.1 cm, most likely due to a Baker cyst.  EKG   07/16/2019: Normal sinus rhythm at rate of 91 beats minute, normal axis.  No evidence of ischemia, normal EKG.  No significant change from  05/11/2019.  Assessment     ICD-10-CM   1. Venous insufficiency of both lower extremities  I87.2   2. Bilateral leg edema  R60.0   3. Substance use disorder  F19.90      Recommendations:   [name]  is a 59 y.o. Caucasian female with bipolar disorder, polysubstance use, chronic pain syndrome, fibromyalgia, Rheumatoid arthritis, degenerative joint disease, last seen in the emergency room on 09/09/2018 with  drug overdose and received Narcan and walked out [place].     Her BNP was completely normal, cardiac examination is normal without any gallop or murmur, no JVD, hence I do not suspect congestive heart failure.  Her leg edema is related to lack of mobility, dependent edema and also she has varicose veins and has history of vein stripping/sclerotherapy in the past.  She is tolerating Aldactone and  renal function and K levels are normal.  She has been abstinent from cocaine since Dec 26th, 2020 and now under psychotherapy and appears to be motivated to remain abstinant.   I will request Dr. Magda Kiel to revisit her leg edema as she needs venous insufficiency re-study. Recent venous duplex does not reveal DVT. Otherwise from cardiac standpoint not much I can add, will see her PRN.   Adrian Prows, MD, [facility] 10/24/2019, 10:22 PM [facility]. [place] Pager: [phone] Office: [phone]  CC: Dr. Magda Kiel

## 2019-10-24 ENCOUNTER — Ambulatory Visit: Payer: BC Managed Care – PPO | Admitting: Cardiology

## 2019-10-24 ENCOUNTER — Encounter: Payer: Self-pay | Admitting: Cardiology

## 2019-10-24 ENCOUNTER — Other Ambulatory Visit: Payer: Self-pay

## 2019-10-24 VITALS — BP 108/74 | HR 92 | Resp 16 | Ht 65.0 in | Wt 230.0 lb

## 2019-10-24 DIAGNOSIS — F1990 Other psychoactive substance use, unspecified, uncomplicated: Secondary | ICD-10-CM | POA: Diagnosis not present

## 2019-10-24 DIAGNOSIS — I872 Venous insufficiency (chronic) (peripheral): Secondary | ICD-10-CM

## 2019-10-24 DIAGNOSIS — R600 Localized edema: Secondary | ICD-10-CM

## 2019-10-30 DIAGNOSIS — M797 Fibromyalgia: Secondary | ICD-10-CM | POA: Diagnosis not present

## 2019-10-30 DIAGNOSIS — M2550 Pain in unspecified joint: Secondary | ICD-10-CM | POA: Diagnosis not present

## 2019-10-30 DIAGNOSIS — M792 Neuralgia and neuritis, unspecified: Secondary | ICD-10-CM | POA: Diagnosis not present

## 2019-10-31 DIAGNOSIS — F410 Panic disorder [episodic paroxysmal anxiety] without agoraphobia: Secondary | ICD-10-CM | POA: Diagnosis not present

## 2019-10-31 DIAGNOSIS — F3181 Bipolar II disorder: Secondary | ICD-10-CM | POA: Diagnosis not present

## 2019-11-05 ENCOUNTER — Encounter: Payer: Self-pay | Admitting: Podiatry

## 2019-11-05 ENCOUNTER — Ambulatory Visit (INDEPENDENT_AMBULATORY_CARE_PROVIDER_SITE_OTHER): Payer: BC Managed Care – PPO | Admitting: Podiatry

## 2019-11-05 ENCOUNTER — Other Ambulatory Visit: Payer: Self-pay

## 2019-11-05 DIAGNOSIS — I872 Venous insufficiency (chronic) (peripheral): Secondary | ICD-10-CM

## 2019-11-05 DIAGNOSIS — R2681 Unsteadiness on feet: Secondary | ICD-10-CM

## 2019-11-05 DIAGNOSIS — M79673 Pain in unspecified foot: Secondary | ICD-10-CM

## 2019-11-05 DIAGNOSIS — Q828 Other specified congenital malformations of skin: Secondary | ICD-10-CM | POA: Diagnosis not present

## 2019-11-05 DIAGNOSIS — G8929 Other chronic pain: Secondary | ICD-10-CM

## 2019-11-05 NOTE — Patient Instructions (Signed)
Keep the bandage on for 24 hours. At that time, remove and clean with soap and water. If it hurts or burns before 24 hours go ahead and remove the bandage and wash with soap and water. Keep the area clean. If there is any blistering cover with antibiotic ointment and a bandage. Monitor for any redness, drainage, or other signs of infection. Call the office if any are to occur. If you have any questions, please call the office at [phone].  

## 2019-11-06 DIAGNOSIS — Z79899 Other long term (current) drug therapy: Secondary | ICD-10-CM | POA: Diagnosis not present

## 2019-11-06 DIAGNOSIS — M2550 Pain in unspecified joint: Secondary | ICD-10-CM | POA: Diagnosis not present

## 2019-11-06 DIAGNOSIS — Z5181 Encounter for therapeutic drug level monitoring: Secondary | ICD-10-CM | POA: Diagnosis not present

## 2019-11-06 DIAGNOSIS — M797 Fibromyalgia: Secondary | ICD-10-CM | POA: Diagnosis not present

## 2019-11-06 DIAGNOSIS — M792 Neuralgia and neuritis, unspecified: Secondary | ICD-10-CM | POA: Diagnosis not present

## 2019-11-11 NOTE — Progress Notes (Signed)
Subjective: 59 year old female presents the office today for painful callus on the bottom, lateral aspect of her left foot which is painful with pressure in shoes.  She needs to have this trimmed but asking about hardware removal on the right foot. She thinks that removing the hardware will make her foot shorter.  Denies any systemic complaints such as fevers, chills, nausea, vomiting. No acute changes since last appointment, and no other complaints at this time.   Objective: AAO x3, NAD DP/PT pulses palpable bilaterally, CRT less than 3 seconds Hyperkeratotic lesion left foot fifth metatarsal base plantarly.  No ongoing ulceration drainage or signs of infection. No evidence of foreign body. There is minimal edema but no erythema or warmth. No drainage or pus.  Chronic swelling present bilaterally to the legs/feet.  No open lesions or pre-ulcerative lesions.  No pain with calf compression, swelling, warmth, erythema  Assessment: Hyperkeratotic lesion left; chronic swelling  Plan: -All treatment options discussed with the patient including all alternatives, risks, complications.  -The hyperkeratotic tissue was then debrided without any complications or bleeding.  Offloading pad dispensed. Discussed this cannot be "cut out" as it will likely recur.  -I want to avoid any further surgery given her history and recent overdose last year. I would like to avoid any narcotics if possible even though she is going to pain management.  I also do not believe that removal of the hardware in the right foot is can to make her foot "shorter".  Continue follow-up with calf vein specialist for the swelling. -Patient encouraged to call the office with any questions, concerns, change in symptoms.   Trula Slade DPM

## 2019-11-15 DIAGNOSIS — M797 Fibromyalgia: Secondary | ICD-10-CM | POA: Diagnosis not present

## 2019-11-29 DIAGNOSIS — Z20828 Contact with and (suspected) exposure to other viral communicable diseases: Secondary | ICD-10-CM | POA: Diagnosis not present

## 2019-12-06 DIAGNOSIS — Z20828 Contact with and (suspected) exposure to other viral communicable diseases: Secondary | ICD-10-CM | POA: Diagnosis not present

## 2019-12-10 DIAGNOSIS — M545 Low back pain: Secondary | ICD-10-CM | POA: Diagnosis not present

## 2019-12-10 DIAGNOSIS — M25561 Pain in right knee: Secondary | ICD-10-CM | POA: Diagnosis not present

## 2019-12-10 DIAGNOSIS — M25562 Pain in left knee: Secondary | ICD-10-CM | POA: Diagnosis not present

## 2019-12-28 DIAGNOSIS — M1711 Unilateral primary osteoarthritis, right knee: Secondary | ICD-10-CM | POA: Diagnosis not present

## 2019-12-31 DIAGNOSIS — M79661 Pain in right lower leg: Secondary | ICD-10-CM | POA: Diagnosis not present

## 2019-12-31 DIAGNOSIS — L03115 Cellulitis of right lower limb: Secondary | ICD-10-CM | POA: Diagnosis not present

## 2019-12-31 DIAGNOSIS — M7121 Synovial cyst of popliteal space [Baker], right knee: Secondary | ICD-10-CM | POA: Diagnosis not present

## 2019-12-31 DIAGNOSIS — R2241 Localized swelling, mass and lump, right lower limb: Secondary | ICD-10-CM | POA: Diagnosis not present

## 2020-01-03 DIAGNOSIS — Z03818 Encounter for observation for suspected exposure to other biological agents ruled out: Secondary | ICD-10-CM | POA: Diagnosis not present

## 2020-01-07 ENCOUNTER — Other Ambulatory Visit: Payer: Self-pay | Admitting: Sports Medicine

## 2020-01-07 ENCOUNTER — Ambulatory Visit: Payer: BC Managed Care – PPO | Admitting: Podiatry

## 2020-01-07 DIAGNOSIS — M545 Low back pain, unspecified: Secondary | ICD-10-CM

## 2020-01-08 DIAGNOSIS — M2550 Pain in unspecified joint: Secondary | ICD-10-CM | POA: Diagnosis not present

## 2020-01-08 DIAGNOSIS — M797 Fibromyalgia: Secondary | ICD-10-CM | POA: Diagnosis not present

## 2020-01-08 DIAGNOSIS — M792 Neuralgia and neuritis, unspecified: Secondary | ICD-10-CM | POA: Diagnosis not present

## 2020-01-10 ENCOUNTER — Ambulatory Visit: Payer: BC Managed Care – PPO | Admitting: Podiatry

## 2020-01-17 ENCOUNTER — Ambulatory Visit: Payer: BC Managed Care – PPO | Admitting: Podiatry

## 2020-01-29 ENCOUNTER — Other Ambulatory Visit: Payer: BC Managed Care – PPO

## 2020-01-31 DIAGNOSIS — Z03818 Encounter for observation for suspected exposure to other biological agents ruled out: Secondary | ICD-10-CM | POA: Diagnosis not present

## 2020-02-04 DIAGNOSIS — F1120 Opioid dependence, uncomplicated: Secondary | ICD-10-CM | POA: Diagnosis not present

## 2020-02-06 DIAGNOSIS — F1120 Opioid dependence, uncomplicated: Secondary | ICD-10-CM | POA: Diagnosis not present

## 2020-02-07 DIAGNOSIS — M797 Fibromyalgia: Secondary | ICD-10-CM | POA: Diagnosis not present

## 2020-02-12 DIAGNOSIS — F1120 Opioid dependence, uncomplicated: Secondary | ICD-10-CM | POA: Diagnosis not present

## 2020-02-18 ENCOUNTER — Ambulatory Visit (INDEPENDENT_AMBULATORY_CARE_PROVIDER_SITE_OTHER): Payer: BC Managed Care – PPO | Admitting: Podiatry

## 2020-02-18 ENCOUNTER — Other Ambulatory Visit: Payer: Self-pay

## 2020-02-18 DIAGNOSIS — Q828 Other specified congenital malformations of skin: Secondary | ICD-10-CM | POA: Diagnosis not present

## 2020-02-18 DIAGNOSIS — I872 Venous insufficiency (chronic) (peripheral): Secondary | ICD-10-CM | POA: Diagnosis not present

## 2020-02-18 DIAGNOSIS — G8929 Other chronic pain: Secondary | ICD-10-CM

## 2020-02-18 DIAGNOSIS — M79673 Pain in unspecified foot: Secondary | ICD-10-CM

## 2020-02-19 DIAGNOSIS — F1120 Opioid dependence, uncomplicated: Secondary | ICD-10-CM | POA: Diagnosis not present

## 2020-02-26 NOTE — Progress Notes (Signed)
Subjective: 59 year old female presents the office today for painful callus on the bottom, lateral aspect of her left foot which is painful with pressure in shoes. She wants the area "cut out".  Denies any open sores or any swelling or redness.  She has no other concerns today. Denies any systemic complaints such as fevers, chills, nausea, vomiting. No acute changes since last appointment, and no other complaints at this time.   Objective: AAO x3, NAD DP/PT pulses palpable bilaterally, CRT less than 3 seconds Prominence of the fifth metatarsal base on the left side. Hyperkeratotic lesion left foot fifth metatarsal base plantarly.  No underlying ulceration drainage or signs of infection. No evidence of foreign body. There is no significant edema and there is no erythema or warmth. No drainage or pus.  Chronic swelling present bilaterally to the legs/feet.  She has been seen by vascular surgery for this.   No open lesions or pre-ulcerative lesions.  No pain with calf compression, swelling, warmth, erythema  Assessment: Hyperkeratotic lesion left; chronic swelling  Plan: -All treatment options discussed with the patient including all alternatives, risks, complications.  -She remains anesthetized the area.  I cleaned the skin with alcohol and mixture of 3 cc of lidocaine, Marcaine plain was infiltrated around the skin lesion.  There was cleaned with alcohol.  The hyperkeratotic tissue was then debrided without any complications or bleeding.  Offloading pad dispensed.  Discussed that I think some of her symptoms are also due to the prominent bone.  We are not going any further surgery. -Encourage elevation of her legs and compression wraps utilizing Ace bandages were applied today.  Follow-up with vascular surgery. -Patient encouraged to call the office with any questions, concerns, change in symptoms.   [name] DPM

## 2020-02-27 DIAGNOSIS — F1120 Opioid dependence, uncomplicated: Secondary | ICD-10-CM | POA: Diagnosis not present

## 2020-03-03 DIAGNOSIS — Z7151 Drug abuse counseling and surveillance of drug abuser: Secondary | ICD-10-CM | POA: Diagnosis not present

## 2020-03-03 DIAGNOSIS — F1113 Opioid abuse with withdrawal: Secondary | ICD-10-CM | POA: Diagnosis not present

## 2020-03-03 DIAGNOSIS — F1120 Opioid dependence, uncomplicated: Secondary | ICD-10-CM | POA: Diagnosis not present

## 2020-03-03 DIAGNOSIS — Z79899 Other long term (current) drug therapy: Secondary | ICD-10-CM | POA: Diagnosis not present

## 2020-03-05 DIAGNOSIS — Z7151 Drug abuse counseling and surveillance of drug abuser: Secondary | ICD-10-CM | POA: Diagnosis not present

## 2020-03-05 DIAGNOSIS — F1120 Opioid dependence, uncomplicated: Secondary | ICD-10-CM | POA: Diagnosis not present

## 2020-03-10 DIAGNOSIS — F1120 Opioid dependence, uncomplicated: Secondary | ICD-10-CM | POA: Diagnosis not present

## 2020-03-10 DIAGNOSIS — Z7151 Drug abuse counseling and surveillance of drug abuser: Secondary | ICD-10-CM | POA: Diagnosis not present

## 2020-03-12 DIAGNOSIS — F1120 Opioid dependence, uncomplicated: Secondary | ICD-10-CM | POA: Diagnosis not present

## 2020-03-12 DIAGNOSIS — Z7151 Drug abuse counseling and surveillance of drug abuser: Secondary | ICD-10-CM | POA: Diagnosis not present

## 2020-03-17 DIAGNOSIS — F1120 Opioid dependence, uncomplicated: Secondary | ICD-10-CM | POA: Diagnosis not present

## 2020-03-17 DIAGNOSIS — Z7151 Drug abuse counseling and surveillance of drug abuser: Secondary | ICD-10-CM | POA: Diagnosis not present

## 2020-03-19 DIAGNOSIS — F1120 Opioid dependence, uncomplicated: Secondary | ICD-10-CM | POA: Diagnosis not present

## 2020-03-19 DIAGNOSIS — Z03818 Encounter for observation for suspected exposure to other biological agents ruled out: Secondary | ICD-10-CM | POA: Diagnosis not present

## 2020-03-24 DIAGNOSIS — F1120 Opioid dependence, uncomplicated: Secondary | ICD-10-CM | POA: Diagnosis not present

## 2020-03-24 DIAGNOSIS — Z7151 Drug abuse counseling and surveillance of drug abuser: Secondary | ICD-10-CM | POA: Diagnosis not present

## 2020-03-25 DIAGNOSIS — F3341 Major depressive disorder, recurrent, in partial remission: Secondary | ICD-10-CM | POA: Diagnosis not present

## 2020-03-25 DIAGNOSIS — F3189 Other bipolar disorder: Secondary | ICD-10-CM | POA: Diagnosis not present

## 2020-03-26 DIAGNOSIS — F1120 Opioid dependence, uncomplicated: Secondary | ICD-10-CM | POA: Diagnosis not present

## 2020-03-26 DIAGNOSIS — Z7151 Drug abuse counseling and surveillance of drug abuser: Secondary | ICD-10-CM | POA: Diagnosis not present

## 2020-03-31 DIAGNOSIS — Z7151 Drug abuse counseling and surveillance of drug abuser: Secondary | ICD-10-CM | POA: Diagnosis not present

## 2020-03-31 DIAGNOSIS — F1120 Opioid dependence, uncomplicated: Secondary | ICD-10-CM | POA: Diagnosis not present

## 2020-04-07 DIAGNOSIS — F1120 Opioid dependence, uncomplicated: Secondary | ICD-10-CM | POA: Diagnosis not present

## 2020-04-07 DIAGNOSIS — Z7151 Drug abuse counseling and surveillance of drug abuser: Secondary | ICD-10-CM | POA: Diagnosis not present

## 2020-04-09 DIAGNOSIS — Z7151 Drug abuse counseling and surveillance of drug abuser: Secondary | ICD-10-CM | POA: Diagnosis not present

## 2020-04-09 DIAGNOSIS — F1120 Opioid dependence, uncomplicated: Secondary | ICD-10-CM | POA: Diagnosis not present

## 2020-04-14 DIAGNOSIS — F1120 Opioid dependence, uncomplicated: Secondary | ICD-10-CM | POA: Diagnosis not present

## 2020-04-14 DIAGNOSIS — Z7151 Drug abuse counseling and surveillance of drug abuser: Secondary | ICD-10-CM | POA: Diagnosis not present

## 2020-04-16 DIAGNOSIS — F1120 Opioid dependence, uncomplicated: Secondary | ICD-10-CM | POA: Diagnosis not present

## 2020-04-16 DIAGNOSIS — Z7151 Drug abuse counseling and surveillance of drug abuser: Secondary | ICD-10-CM | POA: Diagnosis not present

## 2020-04-17 DIAGNOSIS — Z03818 Encounter for observation for suspected exposure to other biological agents ruled out: Secondary | ICD-10-CM | POA: Diagnosis not present

## 2020-04-17 DIAGNOSIS — F1120 Opioid dependence, uncomplicated: Secondary | ICD-10-CM | POA: Diagnosis not present

## 2020-04-21 DIAGNOSIS — Z7151 Drug abuse counseling and surveillance of drug abuser: Secondary | ICD-10-CM | POA: Diagnosis not present

## 2020-04-21 DIAGNOSIS — F1120 Opioid dependence, uncomplicated: Secondary | ICD-10-CM | POA: Diagnosis not present

## 2020-04-24 DIAGNOSIS — M79604 Pain in right leg: Secondary | ICD-10-CM | POA: Diagnosis not present

## 2020-04-24 DIAGNOSIS — M25561 Pain in right knee: Secondary | ICD-10-CM | POA: Diagnosis not present

## 2020-04-25 DIAGNOSIS — F1120 Opioid dependence, uncomplicated: Secondary | ICD-10-CM | POA: Diagnosis not present

## 2020-04-25 DIAGNOSIS — Z7151 Drug abuse counseling and surveillance of drug abuser: Secondary | ICD-10-CM | POA: Diagnosis not present

## 2020-04-28 DIAGNOSIS — F1120 Opioid dependence, uncomplicated: Secondary | ICD-10-CM | POA: Diagnosis not present

## 2020-04-28 DIAGNOSIS — Z7151 Drug abuse counseling and surveillance of drug abuser: Secondary | ICD-10-CM | POA: Diagnosis not present

## 2020-04-30 DIAGNOSIS — F1120 Opioid dependence, uncomplicated: Secondary | ICD-10-CM | POA: Diagnosis not present

## 2020-05-05 DIAGNOSIS — F1120 Opioid dependence, uncomplicated: Secondary | ICD-10-CM | POA: Diagnosis not present

## 2020-05-05 DIAGNOSIS — Z7151 Drug abuse counseling and surveillance of drug abuser: Secondary | ICD-10-CM | POA: Diagnosis not present

## 2020-05-06 DIAGNOSIS — F1120 Opioid dependence, uncomplicated: Secondary | ICD-10-CM | POA: Diagnosis not present

## 2020-05-08 DIAGNOSIS — Z7151 Drug abuse counseling and surveillance of drug abuser: Secondary | ICD-10-CM | POA: Diagnosis not present

## 2020-05-08 DIAGNOSIS — F1120 Opioid dependence, uncomplicated: Secondary | ICD-10-CM | POA: Diagnosis not present

## 2020-05-12 DIAGNOSIS — Z7151 Drug abuse counseling and surveillance of drug abuser: Secondary | ICD-10-CM | POA: Diagnosis not present

## 2020-05-12 DIAGNOSIS — F1120 Opioid dependence, uncomplicated: Secondary | ICD-10-CM | POA: Diagnosis not present

## 2020-05-13 DIAGNOSIS — F1120 Opioid dependence, uncomplicated: Secondary | ICD-10-CM | POA: Diagnosis not present

## 2020-05-15 DIAGNOSIS — Z7151 Drug abuse counseling and surveillance of drug abuser: Secondary | ICD-10-CM | POA: Diagnosis not present

## 2020-05-15 DIAGNOSIS — F1120 Opioid dependence, uncomplicated: Secondary | ICD-10-CM | POA: Diagnosis not present

## 2020-05-16 DIAGNOSIS — Z7151 Drug abuse counseling and surveillance of drug abuser: Secondary | ICD-10-CM | POA: Diagnosis not present

## 2020-05-16 DIAGNOSIS — F1120 Opioid dependence, uncomplicated: Secondary | ICD-10-CM | POA: Diagnosis not present

## 2020-05-19 ENCOUNTER — Ambulatory Visit: Payer: BC Managed Care – PPO | Admitting: Podiatry

## 2020-05-19 DIAGNOSIS — F1120 Opioid dependence, uncomplicated: Secondary | ICD-10-CM | POA: Diagnosis not present

## 2020-05-19 DIAGNOSIS — Z7151 Drug abuse counseling and surveillance of drug abuser: Secondary | ICD-10-CM | POA: Diagnosis not present

## 2020-05-20 DIAGNOSIS — F1120 Opioid dependence, uncomplicated: Secondary | ICD-10-CM | POA: Diagnosis not present

## 2020-05-21 DIAGNOSIS — F1120 Opioid dependence, uncomplicated: Secondary | ICD-10-CM | POA: Diagnosis not present

## 2020-05-21 DIAGNOSIS — Z7151 Drug abuse counseling and surveillance of drug abuser: Secondary | ICD-10-CM | POA: Diagnosis not present

## 2020-05-22 DIAGNOSIS — F1120 Opioid dependence, uncomplicated: Secondary | ICD-10-CM | POA: Diagnosis not present

## 2020-05-22 DIAGNOSIS — Z7151 Drug abuse counseling and surveillance of drug abuser: Secondary | ICD-10-CM | POA: Diagnosis not present

## 2020-05-23 DIAGNOSIS — Z7151 Drug abuse counseling and surveillance of drug abuser: Secondary | ICD-10-CM | POA: Diagnosis not present

## 2020-05-23 DIAGNOSIS — F1120 Opioid dependence, uncomplicated: Secondary | ICD-10-CM | POA: Diagnosis not present

## 2020-05-27 DIAGNOSIS — F1120 Opioid dependence, uncomplicated: Secondary | ICD-10-CM | POA: Diagnosis not present

## 2020-05-27 DIAGNOSIS — M170 Bilateral primary osteoarthritis of knee: Secondary | ICD-10-CM | POA: Diagnosis not present

## 2020-05-28 DIAGNOSIS — Z7151 Drug abuse counseling and surveillance of drug abuser: Secondary | ICD-10-CM | POA: Diagnosis not present

## 2020-05-28 DIAGNOSIS — F1120 Opioid dependence, uncomplicated: Secondary | ICD-10-CM | POA: Diagnosis not present

## 2020-05-29 DIAGNOSIS — F1120 Opioid dependence, uncomplicated: Secondary | ICD-10-CM | POA: Diagnosis not present

## 2020-05-29 DIAGNOSIS — M792 Neuralgia and neuritis, unspecified: Secondary | ICD-10-CM | POA: Diagnosis not present

## 2020-05-29 DIAGNOSIS — Z5181 Encounter for therapeutic drug level monitoring: Secondary | ICD-10-CM | POA: Diagnosis not present

## 2020-05-29 DIAGNOSIS — Z79899 Other long term (current) drug therapy: Secondary | ICD-10-CM | POA: Diagnosis not present

## 2020-05-29 DIAGNOSIS — M797 Fibromyalgia: Secondary | ICD-10-CM | POA: Diagnosis not present

## 2020-05-29 DIAGNOSIS — M2550 Pain in unspecified joint: Secondary | ICD-10-CM | POA: Diagnosis not present

## 2020-05-30 DIAGNOSIS — F1120 Opioid dependence, uncomplicated: Secondary | ICD-10-CM | POA: Diagnosis not present

## 2020-05-30 DIAGNOSIS — Z7151 Drug abuse counseling and surveillance of drug abuser: Secondary | ICD-10-CM | POA: Diagnosis not present

## 2020-06-02 DIAGNOSIS — F1120 Opioid dependence, uncomplicated: Secondary | ICD-10-CM | POA: Diagnosis not present

## 2020-06-02 DIAGNOSIS — Z7151 Drug abuse counseling and surveillance of drug abuser: Secondary | ICD-10-CM | POA: Diagnosis not present

## 2020-06-04 DIAGNOSIS — F1120 Opioid dependence, uncomplicated: Secondary | ICD-10-CM | POA: Diagnosis not present

## 2020-06-04 DIAGNOSIS — Z7151 Drug abuse counseling and surveillance of drug abuser: Secondary | ICD-10-CM | POA: Diagnosis not present

## 2020-06-09 DIAGNOSIS — Z7151 Drug abuse counseling and surveillance of drug abuser: Secondary | ICD-10-CM | POA: Diagnosis not present

## 2020-06-09 DIAGNOSIS — F1120 Opioid dependence, uncomplicated: Secondary | ICD-10-CM | POA: Diagnosis not present

## 2020-06-11 DIAGNOSIS — Z03818 Encounter for observation for suspected exposure to other biological agents ruled out: Secondary | ICD-10-CM | POA: Diagnosis not present

## 2020-06-11 DIAGNOSIS — F1120 Opioid dependence, uncomplicated: Secondary | ICD-10-CM | POA: Diagnosis not present

## 2020-06-12 DIAGNOSIS — F1120 Opioid dependence, uncomplicated: Secondary | ICD-10-CM | POA: Diagnosis not present

## 2020-06-12 DIAGNOSIS — M2550 Pain in unspecified joint: Secondary | ICD-10-CM | POA: Diagnosis not present

## 2020-06-12 DIAGNOSIS — M797 Fibromyalgia: Secondary | ICD-10-CM | POA: Diagnosis not present

## 2020-06-12 DIAGNOSIS — Z7151 Drug abuse counseling and surveillance of drug abuser: Secondary | ICD-10-CM | POA: Diagnosis not present

## 2020-06-12 DIAGNOSIS — M792 Neuralgia and neuritis, unspecified: Secondary | ICD-10-CM | POA: Diagnosis not present

## 2020-06-16 DIAGNOSIS — Z7151 Drug abuse counseling and surveillance of drug abuser: Secondary | ICD-10-CM | POA: Diagnosis not present

## 2020-06-16 DIAGNOSIS — F1120 Opioid dependence, uncomplicated: Secondary | ICD-10-CM | POA: Diagnosis not present

## 2020-06-18 DIAGNOSIS — F1120 Opioid dependence, uncomplicated: Secondary | ICD-10-CM | POA: Diagnosis not present

## 2020-06-23 DIAGNOSIS — Z7151 Drug abuse counseling and surveillance of drug abuser: Secondary | ICD-10-CM | POA: Diagnosis not present

## 2020-06-23 DIAGNOSIS — F1120 Opioid dependence, uncomplicated: Secondary | ICD-10-CM | POA: Diagnosis not present

## 2020-06-25 DIAGNOSIS — F1120 Opioid dependence, uncomplicated: Secondary | ICD-10-CM | POA: Diagnosis not present

## 2020-06-25 DIAGNOSIS — Z7151 Drug abuse counseling and surveillance of drug abuser: Secondary | ICD-10-CM | POA: Diagnosis not present

## 2020-06-30 DIAGNOSIS — Z7151 Drug abuse counseling and surveillance of drug abuser: Secondary | ICD-10-CM | POA: Diagnosis not present

## 2020-06-30 DIAGNOSIS — F1120 Opioid dependence, uncomplicated: Secondary | ICD-10-CM | POA: Diagnosis not present

## 2020-07-01 DIAGNOSIS — F1120 Opioid dependence, uncomplicated: Secondary | ICD-10-CM | POA: Diagnosis not present

## 2020-07-02 DIAGNOSIS — Z7151 Drug abuse counseling and surveillance of drug abuser: Secondary | ICD-10-CM | POA: Diagnosis not present

## 2020-07-02 DIAGNOSIS — F1120 Opioid dependence, uncomplicated: Secondary | ICD-10-CM | POA: Diagnosis not present

## 2020-07-03 DIAGNOSIS — Z7151 Drug abuse counseling and surveillance of drug abuser: Secondary | ICD-10-CM | POA: Diagnosis not present

## 2020-07-03 DIAGNOSIS — F1120 Opioid dependence, uncomplicated: Secondary | ICD-10-CM | POA: Diagnosis not present

## 2020-07-07 DIAGNOSIS — F1120 Opioid dependence, uncomplicated: Secondary | ICD-10-CM | POA: Diagnosis not present

## 2020-07-07 DIAGNOSIS — Z7151 Drug abuse counseling and surveillance of drug abuser: Secondary | ICD-10-CM | POA: Diagnosis not present

## 2020-07-09 DIAGNOSIS — F1120 Opioid dependence, uncomplicated: Secondary | ICD-10-CM | POA: Diagnosis not present

## 2020-07-09 DIAGNOSIS — Z7151 Drug abuse counseling and surveillance of drug abuser: Secondary | ICD-10-CM | POA: Diagnosis not present

## 2020-07-10 DIAGNOSIS — F1120 Opioid dependence, uncomplicated: Secondary | ICD-10-CM | POA: Diagnosis not present

## 2020-07-10 DIAGNOSIS — Z03818 Encounter for observation for suspected exposure to other biological agents ruled out: Secondary | ICD-10-CM | POA: Diagnosis not present

## 2020-07-14 DIAGNOSIS — F1120 Opioid dependence, uncomplicated: Secondary | ICD-10-CM | POA: Diagnosis not present

## 2020-07-14 DIAGNOSIS — Z7151 Drug abuse counseling and surveillance of drug abuser: Secondary | ICD-10-CM | POA: Diagnosis not present

## 2020-07-15 DIAGNOSIS — M2550 Pain in unspecified joint: Secondary | ICD-10-CM | POA: Diagnosis not present

## 2020-07-15 DIAGNOSIS — M797 Fibromyalgia: Secondary | ICD-10-CM | POA: Diagnosis not present

## 2020-07-15 DIAGNOSIS — F1120 Opioid dependence, uncomplicated: Secondary | ICD-10-CM | POA: Diagnosis not present

## 2020-07-15 DIAGNOSIS — M792 Neuralgia and neuritis, unspecified: Secondary | ICD-10-CM | POA: Diagnosis not present

## 2020-07-21 DIAGNOSIS — Z7151 Drug abuse counseling and surveillance of drug abuser: Secondary | ICD-10-CM | POA: Diagnosis not present

## 2020-07-21 DIAGNOSIS — F1120 Opioid dependence, uncomplicated: Secondary | ICD-10-CM | POA: Diagnosis not present

## 2020-07-23 DIAGNOSIS — Z7151 Drug abuse counseling and surveillance of drug abuser: Secondary | ICD-10-CM | POA: Diagnosis not present

## 2020-07-23 DIAGNOSIS — Z79899 Other long term (current) drug therapy: Secondary | ICD-10-CM | POA: Diagnosis not present

## 2020-07-23 DIAGNOSIS — M797 Fibromyalgia: Secondary | ICD-10-CM | POA: Diagnosis not present

## 2020-07-23 DIAGNOSIS — M792 Neuralgia and neuritis, unspecified: Secondary | ICD-10-CM | POA: Diagnosis not present

## 2020-07-23 DIAGNOSIS — Z5181 Encounter for therapeutic drug level monitoring: Secondary | ICD-10-CM | POA: Diagnosis not present

## 2020-07-23 DIAGNOSIS — G894 Chronic pain syndrome: Secondary | ICD-10-CM | POA: Diagnosis not present

## 2020-07-23 DIAGNOSIS — F1120 Opioid dependence, uncomplicated: Secondary | ICD-10-CM | POA: Diagnosis not present

## 2020-07-23 DIAGNOSIS — M2550 Pain in unspecified joint: Secondary | ICD-10-CM | POA: Diagnosis not present

## 2020-07-25 DIAGNOSIS — Z7151 Drug abuse counseling and surveillance of drug abuser: Secondary | ICD-10-CM | POA: Diagnosis not present

## 2020-07-25 DIAGNOSIS — F1120 Opioid dependence, uncomplicated: Secondary | ICD-10-CM | POA: Diagnosis not present

## 2020-07-28 DIAGNOSIS — Z7151 Drug abuse counseling and surveillance of drug abuser: Secondary | ICD-10-CM | POA: Diagnosis not present

## 2020-07-28 DIAGNOSIS — F1120 Opioid dependence, uncomplicated: Secondary | ICD-10-CM | POA: Diagnosis not present

## 2020-07-30 DIAGNOSIS — Z7151 Drug abuse counseling and surveillance of drug abuser: Secondary | ICD-10-CM | POA: Diagnosis not present

## 2020-07-30 DIAGNOSIS — F1120 Opioid dependence, uncomplicated: Secondary | ICD-10-CM | POA: Diagnosis not present

## 2020-08-04 DIAGNOSIS — F1120 Opioid dependence, uncomplicated: Secondary | ICD-10-CM | POA: Diagnosis not present

## 2020-08-04 DIAGNOSIS — Z7151 Drug abuse counseling and surveillance of drug abuser: Secondary | ICD-10-CM | POA: Diagnosis not present

## 2020-08-06 DIAGNOSIS — Z7151 Drug abuse counseling and surveillance of drug abuser: Secondary | ICD-10-CM | POA: Diagnosis not present

## 2020-08-06 DIAGNOSIS — F1120 Opioid dependence, uncomplicated: Secondary | ICD-10-CM | POA: Diagnosis not present

## 2020-08-07 DIAGNOSIS — M5030 Other cervical disc degeneration, unspecified cervical region: Secondary | ICD-10-CM | POA: Diagnosis not present

## 2020-08-07 DIAGNOSIS — M150 Primary generalized (osteo)arthritis: Secondary | ICD-10-CM | POA: Diagnosis not present

## 2020-08-07 DIAGNOSIS — F1120 Opioid dependence, uncomplicated: Secondary | ICD-10-CM | POA: Diagnosis not present

## 2020-08-07 DIAGNOSIS — Z03818 Encounter for observation for suspected exposure to other biological agents ruled out: Secondary | ICD-10-CM | POA: Diagnosis not present

## 2020-08-07 DIAGNOSIS — M0579 Rheumatoid arthritis with rheumatoid factor of multiple sites without organ or systems involvement: Secondary | ICD-10-CM | POA: Diagnosis not present

## 2020-08-07 DIAGNOSIS — M5136 Other intervertebral disc degeneration, lumbar region: Secondary | ICD-10-CM | POA: Diagnosis not present

## 2020-08-08 DIAGNOSIS — M1711 Unilateral primary osteoarthritis, right knee: Secondary | ICD-10-CM | POA: Diagnosis not present

## 2020-08-08 DIAGNOSIS — F1120 Opioid dependence, uncomplicated: Secondary | ICD-10-CM | POA: Diagnosis not present

## 2020-08-08 DIAGNOSIS — Z7151 Drug abuse counseling and surveillance of drug abuser: Secondary | ICD-10-CM | POA: Diagnosis not present

## 2020-08-11 ENCOUNTER — Encounter (HOSPITAL_COMMUNITY): Payer: BC Managed Care – PPO

## 2020-08-11 DIAGNOSIS — Z7151 Drug abuse counseling and surveillance of drug abuser: Secondary | ICD-10-CM | POA: Diagnosis not present

## 2020-08-11 DIAGNOSIS — F1120 Opioid dependence, uncomplicated: Secondary | ICD-10-CM | POA: Diagnosis not present

## 2020-08-12 DIAGNOSIS — F1120 Opioid dependence, uncomplicated: Secondary | ICD-10-CM | POA: Diagnosis not present

## 2020-08-13 ENCOUNTER — Inpatient Hospital Stay (HOSPITAL_COMMUNITY): Admission: RE | Admit: 2020-08-13 | Payer: BC Managed Care – PPO | Source: Ambulatory Visit

## 2020-08-13 ENCOUNTER — Other Ambulatory Visit (HOSPITAL_COMMUNITY): Payer: Self-pay | Admitting: Sports Medicine

## 2020-08-13 DIAGNOSIS — M7989 Other specified soft tissue disorders: Secondary | ICD-10-CM

## 2020-08-13 DIAGNOSIS — Z7151 Drug abuse counseling and surveillance of drug abuser: Secondary | ICD-10-CM | POA: Diagnosis not present

## 2020-08-13 DIAGNOSIS — F1120 Opioid dependence, uncomplicated: Secondary | ICD-10-CM | POA: Diagnosis not present

## 2020-08-13 DIAGNOSIS — M79661 Pain in right lower leg: Secondary | ICD-10-CM

## 2020-08-18 DIAGNOSIS — Z7151 Drug abuse counseling and surveillance of drug abuser: Secondary | ICD-10-CM | POA: Diagnosis not present

## 2020-08-18 DIAGNOSIS — F1120 Opioid dependence, uncomplicated: Secondary | ICD-10-CM | POA: Diagnosis not present

## 2020-08-20 DIAGNOSIS — F1120 Opioid dependence, uncomplicated: Secondary | ICD-10-CM | POA: Diagnosis not present

## 2020-08-20 DIAGNOSIS — Z7151 Drug abuse counseling and surveillance of drug abuser: Secondary | ICD-10-CM | POA: Diagnosis not present

## 2020-08-25 DIAGNOSIS — Z7151 Drug abuse counseling and surveillance of drug abuser: Secondary | ICD-10-CM | POA: Diagnosis not present

## 2020-08-25 DIAGNOSIS — F1120 Opioid dependence, uncomplicated: Secondary | ICD-10-CM | POA: Diagnosis not present

## 2020-08-27 DIAGNOSIS — F1120 Opioid dependence, uncomplicated: Secondary | ICD-10-CM | POA: Diagnosis not present

## 2020-08-28 DIAGNOSIS — F1120 Opioid dependence, uncomplicated: Secondary | ICD-10-CM | POA: Diagnosis not present

## 2020-08-28 DIAGNOSIS — Z7151 Drug abuse counseling and surveillance of drug abuser: Secondary | ICD-10-CM | POA: Diagnosis not present

## 2020-08-29 DIAGNOSIS — F1120 Opioid dependence, uncomplicated: Secondary | ICD-10-CM | POA: Diagnosis not present

## 2020-08-29 DIAGNOSIS — Z7151 Drug abuse counseling and surveillance of drug abuser: Secondary | ICD-10-CM | POA: Diagnosis not present

## 2020-09-01 DIAGNOSIS — Z7151 Drug abuse counseling and surveillance of drug abuser: Secondary | ICD-10-CM | POA: Diagnosis not present

## 2020-09-01 DIAGNOSIS — G894 Chronic pain syndrome: Secondary | ICD-10-CM | POA: Diagnosis not present

## 2020-09-01 DIAGNOSIS — M792 Neuralgia and neuritis, unspecified: Secondary | ICD-10-CM | POA: Diagnosis not present

## 2020-09-01 DIAGNOSIS — F1120 Opioid dependence, uncomplicated: Secondary | ICD-10-CM | POA: Diagnosis not present

## 2020-09-01 DIAGNOSIS — M797 Fibromyalgia: Secondary | ICD-10-CM | POA: Diagnosis not present

## 2020-09-01 DIAGNOSIS — M533 Sacrococcygeal disorders, not elsewhere classified: Secondary | ICD-10-CM | POA: Diagnosis not present

## 2020-09-03 DIAGNOSIS — Z7151 Drug abuse counseling and surveillance of drug abuser: Secondary | ICD-10-CM | POA: Diagnosis not present

## 2020-09-03 DIAGNOSIS — F1120 Opioid dependence, uncomplicated: Secondary | ICD-10-CM | POA: Diagnosis not present

## 2020-09-08 DIAGNOSIS — F1120 Opioid dependence, uncomplicated: Secondary | ICD-10-CM | POA: Diagnosis not present

## 2020-09-08 DIAGNOSIS — Z7151 Drug abuse counseling and surveillance of drug abuser: Secondary | ICD-10-CM | POA: Diagnosis not present

## 2020-09-10 DIAGNOSIS — F1120 Opioid dependence, uncomplicated: Secondary | ICD-10-CM | POA: Diagnosis not present

## 2020-09-10 DIAGNOSIS — Z7151 Drug abuse counseling and surveillance of drug abuser: Secondary | ICD-10-CM | POA: Diagnosis not present

## 2020-09-15 DIAGNOSIS — F1120 Opioid dependence, uncomplicated: Secondary | ICD-10-CM | POA: Diagnosis not present

## 2020-09-17 DIAGNOSIS — F1120 Opioid dependence, uncomplicated: Secondary | ICD-10-CM | POA: Diagnosis not present

## 2020-09-18 ENCOUNTER — Other Ambulatory Visit: Payer: Self-pay

## 2020-09-18 ENCOUNTER — Ambulatory Visit: Payer: BC Managed Care – PPO | Admitting: Podiatry

## 2020-09-18 DIAGNOSIS — I739 Peripheral vascular disease, unspecified: Secondary | ICD-10-CM | POA: Diagnosis not present

## 2020-09-18 DIAGNOSIS — M79673 Pain in unspecified foot: Secondary | ICD-10-CM | POA: Diagnosis not present

## 2020-09-18 DIAGNOSIS — Q828 Other specified congenital malformations of skin: Secondary | ICD-10-CM

## 2020-09-18 DIAGNOSIS — G8929 Other chronic pain: Secondary | ICD-10-CM | POA: Diagnosis not present

## 2020-09-22 DIAGNOSIS — Z7151 Drug abuse counseling and surveillance of drug abuser: Secondary | ICD-10-CM | POA: Diagnosis not present

## 2020-09-22 DIAGNOSIS — F1120 Opioid dependence, uncomplicated: Secondary | ICD-10-CM | POA: Diagnosis not present

## 2020-09-23 NOTE — Progress Notes (Signed)
Subjective: 60 year old female presents the office today for painful callus on the bottom, lateral aspect of her left foot which is painful with pressure in shoes. She wants the area "cut out" again.  No open lesions.  Denies any fevers, chills, nausea, vomiting.  No calf pain, chest pain or shortness of breath.    Objective: AAO x3, NAD DP/PT pulses palpable bilaterally, CRT less than 3 seconds Prominence of the fifth metatarsal base on the left side. Hyperkeratotic lesion left foot fifth metatarsal base plantarly.  No underlying ulceration drainage or signs of infection. No evidence of foreign body. There is no significant edema and there is no erythema or warmth to the area. No drainage or pus.  Chronic swelling present bilaterally to the legs/feet.  She has been seen by vascular for this. No open lesions or pre-ulcerative lesions.  No pain with calf compression, swelling, warmth, erythema  Assessment: Hyperkeratotic lesion left; chronic swelling  Plan: -All treatment options discussed with the patient including all alternatives, risks, complications.  -Sharply debrided the hyperkeratotic lesion without any complications or bleeding today down to healthy tissue.  Continue moisturizer and offloading daily.  Return in about 3 months (around 12/19/2020).  Trula Slade DPM

## 2020-09-24 DIAGNOSIS — F1120 Opioid dependence, uncomplicated: Secondary | ICD-10-CM | POA: Diagnosis not present

## 2020-09-26 DIAGNOSIS — F1120 Opioid dependence, uncomplicated: Secondary | ICD-10-CM | POA: Diagnosis not present

## 2020-09-26 DIAGNOSIS — Z7151 Drug abuse counseling and surveillance of drug abuser: Secondary | ICD-10-CM | POA: Diagnosis not present

## 2020-09-29 DIAGNOSIS — Z7151 Drug abuse counseling and surveillance of drug abuser: Secondary | ICD-10-CM | POA: Diagnosis not present

## 2020-09-29 DIAGNOSIS — F1120 Opioid dependence, uncomplicated: Secondary | ICD-10-CM | POA: Diagnosis not present

## 2020-10-01 DIAGNOSIS — Z7151 Drug abuse counseling and surveillance of drug abuser: Secondary | ICD-10-CM | POA: Diagnosis not present

## 2020-10-01 DIAGNOSIS — F1120 Opioid dependence, uncomplicated: Secondary | ICD-10-CM | POA: Diagnosis not present

## 2020-10-02 DIAGNOSIS — Z03818 Encounter for observation for suspected exposure to other biological agents ruled out: Secondary | ICD-10-CM | POA: Diagnosis not present

## 2020-10-02 DIAGNOSIS — F1120 Opioid dependence, uncomplicated: Secondary | ICD-10-CM | POA: Diagnosis not present

## 2020-10-03 DIAGNOSIS — E039 Hypothyroidism, unspecified: Secondary | ICD-10-CM | POA: Diagnosis not present

## 2020-10-03 DIAGNOSIS — E559 Vitamin D deficiency, unspecified: Secondary | ICD-10-CM | POA: Diagnosis not present

## 2020-10-03 DIAGNOSIS — I10 Essential (primary) hypertension: Secondary | ICD-10-CM | POA: Diagnosis not present

## 2020-10-06 DIAGNOSIS — Z7151 Drug abuse counseling and surveillance of drug abuser: Secondary | ICD-10-CM | POA: Diagnosis not present

## 2020-10-06 DIAGNOSIS — F1120 Opioid dependence, uncomplicated: Secondary | ICD-10-CM | POA: Diagnosis not present

## 2020-10-10 DIAGNOSIS — Z7151 Drug abuse counseling and surveillance of drug abuser: Secondary | ICD-10-CM | POA: Diagnosis not present

## 2020-10-10 DIAGNOSIS — F1120 Opioid dependence, uncomplicated: Secondary | ICD-10-CM | POA: Diagnosis not present

## 2020-10-13 DIAGNOSIS — F1120 Opioid dependence, uncomplicated: Secondary | ICD-10-CM | POA: Diagnosis not present

## 2020-10-15 DIAGNOSIS — Z7151 Drug abuse counseling and surveillance of drug abuser: Secondary | ICD-10-CM | POA: Diagnosis not present

## 2020-10-15 DIAGNOSIS — F1120 Opioid dependence, uncomplicated: Secondary | ICD-10-CM | POA: Diagnosis not present

## 2020-10-16 DIAGNOSIS — Z7151 Drug abuse counseling and surveillance of drug abuser: Secondary | ICD-10-CM | POA: Diagnosis not present

## 2020-10-16 DIAGNOSIS — F1120 Opioid dependence, uncomplicated: Secondary | ICD-10-CM | POA: Diagnosis not present

## 2020-10-17 DIAGNOSIS — M533 Sacrococcygeal disorders, not elsewhere classified: Secondary | ICD-10-CM | POA: Diagnosis not present

## 2020-10-20 DIAGNOSIS — Z7151 Drug abuse counseling and surveillance of drug abuser: Secondary | ICD-10-CM | POA: Diagnosis not present

## 2020-10-20 DIAGNOSIS — F1120 Opioid dependence, uncomplicated: Secondary | ICD-10-CM | POA: Diagnosis not present

## 2020-10-22 DIAGNOSIS — F1120 Opioid dependence, uncomplicated: Secondary | ICD-10-CM | POA: Diagnosis not present

## 2020-10-24 DIAGNOSIS — F1120 Opioid dependence, uncomplicated: Secondary | ICD-10-CM | POA: Diagnosis not present

## 2020-10-27 DIAGNOSIS — F1120 Opioid dependence, uncomplicated: Secondary | ICD-10-CM | POA: Diagnosis not present

## 2020-10-27 DIAGNOSIS — Z7151 Drug abuse counseling and surveillance of drug abuser: Secondary | ICD-10-CM | POA: Diagnosis not present

## 2020-10-28 DIAGNOSIS — F1120 Opioid dependence, uncomplicated: Secondary | ICD-10-CM | POA: Diagnosis not present

## 2020-10-30 DIAGNOSIS — F1120 Opioid dependence, uncomplicated: Secondary | ICD-10-CM | POA: Diagnosis not present

## 2020-10-30 DIAGNOSIS — Z03818 Encounter for observation for suspected exposure to other biological agents ruled out: Secondary | ICD-10-CM | POA: Diagnosis not present

## 2020-10-31 DIAGNOSIS — Z7151 Drug abuse counseling and surveillance of drug abuser: Secondary | ICD-10-CM | POA: Diagnosis not present

## 2020-10-31 DIAGNOSIS — F1120 Opioid dependence, uncomplicated: Secondary | ICD-10-CM | POA: Diagnosis not present

## 2020-11-03 DIAGNOSIS — F1120 Opioid dependence, uncomplicated: Secondary | ICD-10-CM | POA: Diagnosis not present

## 2020-11-03 DIAGNOSIS — Z7151 Drug abuse counseling and surveillance of drug abuser: Secondary | ICD-10-CM | POA: Diagnosis not present

## 2020-11-05 DIAGNOSIS — F1120 Opioid dependence, uncomplicated: Secondary | ICD-10-CM | POA: Diagnosis not present

## 2020-11-06 DIAGNOSIS — F1120 Opioid dependence, uncomplicated: Secondary | ICD-10-CM | POA: Diagnosis not present

## 2020-11-06 DIAGNOSIS — Z7151 Drug abuse counseling and surveillance of drug abuser: Secondary | ICD-10-CM | POA: Diagnosis not present

## 2020-11-10 DIAGNOSIS — Z7151 Drug abuse counseling and surveillance of drug abuser: Secondary | ICD-10-CM | POA: Diagnosis not present

## 2020-11-10 DIAGNOSIS — F1120 Opioid dependence, uncomplicated: Secondary | ICD-10-CM | POA: Diagnosis not present

## 2020-11-11 DIAGNOSIS — F1120 Opioid dependence, uncomplicated: Secondary | ICD-10-CM | POA: Diagnosis not present

## 2020-11-12 DIAGNOSIS — F1120 Opioid dependence, uncomplicated: Secondary | ICD-10-CM | POA: Diagnosis not present

## 2020-11-12 DIAGNOSIS — Z7151 Drug abuse counseling and surveillance of drug abuser: Secondary | ICD-10-CM | POA: Diagnosis not present

## 2020-11-13 ENCOUNTER — Other Ambulatory Visit: Payer: Self-pay

## 2020-11-13 ENCOUNTER — Encounter: Payer: Self-pay | Admitting: Podiatry

## 2020-11-13 ENCOUNTER — Ambulatory Visit (INDEPENDENT_AMBULATORY_CARE_PROVIDER_SITE_OTHER): Payer: BC Managed Care – PPO | Admitting: Podiatry

## 2020-11-13 DIAGNOSIS — M2142 Flat foot [pes planus] (acquired), left foot: Secondary | ICD-10-CM

## 2020-11-13 DIAGNOSIS — M79673 Pain in unspecified foot: Secondary | ICD-10-CM

## 2020-11-13 DIAGNOSIS — G8929 Other chronic pain: Secondary | ICD-10-CM | POA: Diagnosis not present

## 2020-11-13 DIAGNOSIS — M2141 Flat foot [pes planus] (acquired), right foot: Secondary | ICD-10-CM

## 2020-11-13 DIAGNOSIS — I739 Peripheral vascular disease, unspecified: Secondary | ICD-10-CM

## 2020-11-13 DIAGNOSIS — I890 Lymphedema, not elsewhere classified: Secondary | ICD-10-CM

## 2020-11-13 DIAGNOSIS — L603 Nail dystrophy: Secondary | ICD-10-CM | POA: Diagnosis not present

## 2020-11-13 NOTE — Progress Notes (Signed)
  Subjective:  Patient ID: [name], female    DOB: 1960-07-20,  MRN: [id number]  Chief Complaint  Patient presents with   Callouses      bone sticking out of bottom of R foot, causing severe pain     60 y.o. female presents with the above complaint. History confirmed with patient.  She has a history of multiple bilateral foot surgeries including midfoot fusion and reconstruction of the right foot.  She said she has had difficulty with him both before and after surgery.  The foot is very flat and turned out.  She has pain on these hard calluses on the bottom of her feet.  She also states her nails are thick and difficult to cut  Objective:  Physical Exam: warm, good capillary refill, no trophic changes or ulcerative lesions, normal DP and PT pulses, and normal sensory exam.  She has severe lymphedema bilaterally it is worse on the right side.  She is severe pes planovalgus deformity worse on the right side.  Well-healed not hypertrophic scars.  She has porokeratosis in the midfoot and mid arch on the right foot and fifth metatarsal base on the left foot.    Assessment:   1. Chronic foot pain, unspecified laterality   2. Pes planus of both feet   3. PVD (peripheral vascular disease) ([place])   4. Lymphedema   5. Nail dystrophy      Plan:  Patient was evaluated and treated and all questions answered.  She has severe foot deformity and multiple comorbidities that would make her a very difficult surgical candidate for reconstruction.  Discussed with her this something that is beyond my capability.  I recommended she be evaluated and treated at [facility].  We will send a referral for her.  I think she is a very high risk for surgery with her smoking and lymphedema.  Also had multiple issues with narcotic use.  She has tried multiple shoes and orthotics.  There is not much I can offer her.  I did debride her nails of all dystrophic nail plate at her request to adequate length and  thickness.  Return if symptoms worsen or fail to improve.

## 2020-11-19 DIAGNOSIS — M792 Neuralgia and neuritis, unspecified: Secondary | ICD-10-CM | POA: Diagnosis not present

## 2020-11-19 DIAGNOSIS — Z5181 Encounter for therapeutic drug level monitoring: Secondary | ICD-10-CM | POA: Diagnosis not present

## 2020-11-19 DIAGNOSIS — G894 Chronic pain syndrome: Secondary | ICD-10-CM | POA: Diagnosis not present

## 2020-11-19 DIAGNOSIS — Z79899 Other long term (current) drug therapy: Secondary | ICD-10-CM | POA: Diagnosis not present

## 2020-11-20 DIAGNOSIS — Z7151 Drug abuse counseling and surveillance of drug abuser: Secondary | ICD-10-CM | POA: Diagnosis not present

## 2020-11-20 DIAGNOSIS — F1120 Opioid dependence, uncomplicated: Secondary | ICD-10-CM | POA: Diagnosis not present

## 2020-11-24 DIAGNOSIS — F1120 Opioid dependence, uncomplicated: Secondary | ICD-10-CM | POA: Diagnosis not present

## 2020-11-24 DIAGNOSIS — Z7151 Drug abuse counseling and surveillance of drug abuser: Secondary | ICD-10-CM | POA: Diagnosis not present

## 2020-11-25 DIAGNOSIS — F1120 Opioid dependence, uncomplicated: Secondary | ICD-10-CM | POA: Diagnosis not present

## 2020-11-27 DIAGNOSIS — Z03818 Encounter for observation for suspected exposure to other biological agents ruled out: Secondary | ICD-10-CM | POA: Diagnosis not present

## 2020-11-27 DIAGNOSIS — F1120 Opioid dependence, uncomplicated: Secondary | ICD-10-CM | POA: Diagnosis not present

## 2020-12-01 DIAGNOSIS — F1120 Opioid dependence, uncomplicated: Secondary | ICD-10-CM | POA: Diagnosis not present

## 2020-12-01 DIAGNOSIS — Z7151 Drug abuse counseling and surveillance of drug abuser: Secondary | ICD-10-CM | POA: Diagnosis not present

## 2020-12-04 ENCOUNTER — Other Ambulatory Visit: Payer: Self-pay

## 2020-12-04 ENCOUNTER — Ambulatory Visit (INDEPENDENT_AMBULATORY_CARE_PROVIDER_SITE_OTHER): Payer: BC Managed Care – PPO

## 2020-12-04 ENCOUNTER — Ambulatory Visit: Payer: BC Managed Care – PPO | Admitting: Podiatry

## 2020-12-04 ENCOUNTER — Ambulatory Visit (INDEPENDENT_AMBULATORY_CARE_PROVIDER_SITE_OTHER): Payer: BC Managed Care – PPO | Admitting: Sports Medicine

## 2020-12-04 ENCOUNTER — Other Ambulatory Visit: Payer: Self-pay | Admitting: Sports Medicine

## 2020-12-04 DIAGNOSIS — I739 Peripheral vascular disease, unspecified: Secondary | ICD-10-CM | POA: Diagnosis not present

## 2020-12-04 DIAGNOSIS — M79671 Pain in right foot: Secondary | ICD-10-CM

## 2020-12-04 DIAGNOSIS — I890 Lymphedema, not elsewhere classified: Secondary | ICD-10-CM

## 2020-12-04 DIAGNOSIS — R2681 Unsteadiness on feet: Secondary | ICD-10-CM

## 2020-12-04 DIAGNOSIS — M2141 Flat foot [pes planus] (acquired), right foot: Secondary | ICD-10-CM

## 2020-12-04 DIAGNOSIS — M2142 Flat foot [pes planus] (acquired), left foot: Secondary | ICD-10-CM

## 2020-12-04 DIAGNOSIS — M1991 Primary osteoarthritis, unspecified site: Secondary | ICD-10-CM | POA: Insufficient documentation

## 2020-12-04 DIAGNOSIS — M79673 Pain in unspecified foot: Secondary | ICD-10-CM | POA: Diagnosis not present

## 2020-12-04 DIAGNOSIS — Z6838 Body mass index (BMI) 38.0-38.9, adult: Secondary | ICD-10-CM | POA: Insufficient documentation

## 2020-12-04 DIAGNOSIS — G8929 Other chronic pain: Secondary | ICD-10-CM

## 2020-12-04 DIAGNOSIS — M5030 Other cervical disc degeneration, unspecified cervical region: Secondary | ICD-10-CM | POA: Insufficient documentation

## 2020-12-04 NOTE — Patient Instructions (Signed)
[place]

## 2020-12-04 NOTE — Progress Notes (Signed)
Subjective: [name] is a 60 y.o. female patient who presents to office for evaluation of right foot pain.  Patient reports that her bone is sticking out on the right foot and is painful when she attempts to walk states that she had surgery sometime ago with Dr. Earleen Newport and states that the surgery really did not help her foot and it still hurts.  Patient was seen last month for surgery evaluation with Dr. Sherryle Lis and was referred to orthopedics at [facility] patient reports that she did not get a call to go there.  Patient also has concern about rubbing when she is in a shoe and also concern for pain at calluses plantar surface of both feet.  Patient Active Problem List   Diagnosis Date Noted   Body mass index (BMI) 38.0-38.9, adult 12/04/2020   DDD (degenerative disc disease), cervical 12/04/2020   Primary osteoarthritis 12/04/2020   Chronic venous insufficiency 08/13/2019   Lymphedema 08/13/2019   Acute encephalopathy 05/12/2019   Rheumatoid arthritis involving multiple sites with positive rheumatoid factor ([place]) 05/21/2016   Primary osteoarthritis of both hands 05/21/2016   Primary osteoarthritis of both feet 05/21/2016   Primary osteoarthritis of both knees 05/21/2016   Primary osteoarthritis of right shoulder 05/21/2016   Acute pericarditis 04/23/2014   Rheumatoid arthritis ([place]) 04/11/2012   History of narcotic addiction ([place]) 03/31/2012   Fibromyalgia 03/31/2012    Current Outpatient Medications on File Prior to Visit  Medication Sig Dispense Refill   albuterol (VENTOLIN HFA) 108 (90 Base) MCG/ACT inhaler Inhale 2 puffs into the lungs every 4 (four) hours as needed.     ALPRAZolam (XANAX) 1 MG tablet Take 1 mg by mouth 2 (two) times daily.     ARIPiprazole (ABILIFY) 5 MG tablet Take 5 mg by mouth daily.     bumetanide (BUMEX) 1 MG tablet Take by mouth.     Buprenorphine HCl-Naloxone HCl 8-2 MG FILM Place under the tongue 3 (three) times daily.     cyclobenzaprine (FLEXERIL) 10 MG  tablet Take 10 mg by mouth 3 (three) times daily.     diclofenac (VOLTAREN) 50 MG EC tablet Take 50 mg by mouth 2 (two) times daily as needed.     doxycycline (MONODOX) 100 MG capsule Take by mouth.     DULoxetine (CYMBALTA) 60 MG capsule Take 120 mg by mouth daily. 2 60mg s in am     DULoxetine (CYMBALTA) 60 MG capsule 2 capsules     ergocalciferol (VITAMIN D2) 1.25 MG (50000 UT) capsule Take 1 capsule by mouth once a week.     ketorolac (TORADOL) 10 MG tablet Take 10 mg by mouth 4 (four) times daily.     levocetirizine (XYZAL) 5 MG tablet Take 5 mg by mouth daily.     levothyroxine (SYNTHROID) 112 MCG tablet Take by mouth.     levothyroxine (SYNTHROID) 137 MCG tablet Take by mouth daily before breakfast.      pregabalin (LYRICA) 200 MG capsule Take 200 mg by mouth 2 (two) times daily.     pregabalin (LYRICA) 200 MG capsule 1 capsule     traZODone (DESYREL) 100 MG tablet Take 100 mg by mouth at bedtime.      VALIUM 5 MG tablet Take by mouth.     torsemide (DEMADEX) 20 MG tablet Take 1 tablet (20 mg total) by mouth 2 (two) times daily. 60 tablet 1   Current Facility-Administered Medications on File Prior to Visit  Medication Dose Route Frequency Provider Last Rate Last  Admin   triamcinolone acetonide (KENALOG) 10 MG/ML injection 10 mg  10 mg Other Once Trula Slade, DPM        Allergies  Allergen Reactions   Amitriptyline Palpitations    Increases heart rate   Methotrexate Other (See Comments)    Objective:  General: Alert and oriented x3 in no acute distress  Dermatology: No open lesions bilateral lower extremities, no webspace macerations, no ecchymosis bilateral, all nails x 10 are well manicured.  Old surgical scars well-healed right foot.  There is mild reactive callus plantar heel on the left and medial foot on the right.  Vascular: Dorsalis Pedis and Posterior Tibial pedal pulses non-palpable, Capillary Fill Time 3 seconds,(-) pedal hair growth bilateral, chronic edema  bilateral lower extremities, Temperature gradient within normal limits.  Neurology: Johney Maine sensation intact via light touch bilateral.  Musculoskeletal: Mild tenderness with palpation at medial foot and ankle at the PT tendon course as well as along the prominent navicular with significant midtarsal sag and prominent metatarsal bones secondary to severe pes planovalgus deformity right greater than left.  Gait: Antalgic gait  Xrays  Right foot   Impression: Extensive hardware to right foot from previous foot surgery it does appear that hardware is not holding however not completely loose except one screw there is significant osteopenia as well as significant pes planus deformity that also has ankle involvement  Assessment and Plan: Problem List Items Addressed This Visit       Other   Lymphedema   Other Visit Diagnoses     Pain in right foot    -  Primary   Chronic foot pain, unspecified laterality       Relevant Medications   cyclobenzaprine (FLEXERIL) 10 MG tablet   DULoxetine (CYMBALTA) 60 MG capsule   ketorolac (TORADOL) 10 MG tablet   pregabalin (LYRICA) 200 MG capsule   Pes planus of both feet       PVD (peripheral vascular disease) ([facility])       Relevant Medications   bumetanide (BUMEX) 1 MG tablet   Gait instability            -Complete examination performed -Xrays reviewed -Discussed treatment options pain at right foot secondary to severe deformity versus painful hardware -Advised patient since her deformity is very complex and that she has had previous foot surgery here without success it is better for her to get a second opinion from orthopedic recommend against referral to [place] however did make patient very well aware since she is a smoker and has severe lymphedema she may not be a surgical candidate -Copy of x-rays provided to patient this visit -At this time applied additional offloading padding to both shoes -At this time mechanically debrided keratotic lesions  plantar surfaces of both feet using a 15 blade and applied Salinocaine and Band-Aid -Patient to return to office as needed or sooner if condition worsens.  Landis Martins, DPM

## 2020-12-08 DIAGNOSIS — Z7151 Drug abuse counseling and surveillance of drug abuser: Secondary | ICD-10-CM | POA: Diagnosis not present

## 2020-12-08 DIAGNOSIS — F1120 Opioid dependence, uncomplicated: Secondary | ICD-10-CM | POA: Diagnosis not present

## 2020-12-09 ENCOUNTER — Telehealth: Payer: Self-pay | Admitting: Sports Medicine

## 2020-12-09 NOTE — Telephone Encounter (Signed)
Patient called and stated that she needed referral sent over to an orthopedic in [place]. Patient would like the referral asap.  Please advise

## 2020-12-10 ENCOUNTER — Telehealth: Payer: Self-pay | Admitting: *Deleted

## 2020-12-10 NOTE — Telephone Encounter (Signed)
So sorry but just seeing this note,notes has been faxed to [facility] 12/10/20.

## 2020-12-12 NOTE — Telephone Encounter (Signed)
Called patient and informed thru vmeeage that referral has been sent to [facility], they will contact to schedule appointment.

## 2020-12-15 DIAGNOSIS — Z7151 Drug abuse counseling and surveillance of drug abuser: Secondary | ICD-10-CM | POA: Diagnosis not present

## 2020-12-15 DIAGNOSIS — F1120 Opioid dependence, uncomplicated: Secondary | ICD-10-CM | POA: Diagnosis not present

## 2020-12-17 DIAGNOSIS — F1120 Opioid dependence, uncomplicated: Secondary | ICD-10-CM | POA: Diagnosis not present

## 2020-12-17 DIAGNOSIS — Z7151 Drug abuse counseling and surveillance of drug abuser: Secondary | ICD-10-CM | POA: Diagnosis not present

## 2020-12-19 DIAGNOSIS — Z981 Arthrodesis status: Secondary | ICD-10-CM | POA: Diagnosis not present

## 2020-12-19 DIAGNOSIS — M79671 Pain in right foot: Secondary | ICD-10-CM | POA: Diagnosis not present

## 2020-12-19 DIAGNOSIS — Z967 Presence of other bone and tendon implants: Secondary | ICD-10-CM | POA: Diagnosis not present

## 2020-12-19 DIAGNOSIS — M19071 Primary osteoarthritis, right ankle and foot: Secondary | ICD-10-CM | POA: Diagnosis not present

## 2020-12-19 DIAGNOSIS — M7730 Calcaneal spur, unspecified foot: Secondary | ICD-10-CM | POA: Diagnosis not present

## 2020-12-22 ENCOUNTER — Ambulatory Visit: Payer: BC Managed Care – PPO | Admitting: Podiatry

## 2020-12-22 DIAGNOSIS — F1120 Opioid dependence, uncomplicated: Secondary | ICD-10-CM | POA: Diagnosis not present

## 2020-12-22 DIAGNOSIS — Z7151 Drug abuse counseling and surveillance of drug abuser: Secondary | ICD-10-CM | POA: Diagnosis not present

## 2020-12-24 ENCOUNTER — Telehealth: Payer: Self-pay | Admitting: Podiatry

## 2020-12-24 DIAGNOSIS — F1120 Opioid dependence, uncomplicated: Secondary | ICD-10-CM | POA: Diagnosis not present

## 2020-12-24 DIAGNOSIS — Z7151 Drug abuse counseling and surveillance of drug abuser: Secondary | ICD-10-CM | POA: Diagnosis not present

## 2020-12-24 NOTE — Telephone Encounter (Signed)
Patient called pager multiple  times in regard to "screw coming out  of my foot" she denies F/C/N/V and  does not have any open wounds or drainage or exposed hardware she says she is  worried about  the screw making her lose her foot. I let  her know if her pain  is that  severe she could go to the ER  but she  preferred to wait. She has transitioned her care  to [facility] and saw them last week, I advised her to call tomorrow to check on her next appt with them which she said she will do   Lanae Crumbly, DPM 12/24/2020

## 2020-12-25 ENCOUNTER — Ambulatory Visit (INDEPENDENT_AMBULATORY_CARE_PROVIDER_SITE_OTHER): Payer: BC Managed Care – PPO | Admitting: Podiatry

## 2020-12-25 ENCOUNTER — Ambulatory Visit (INDEPENDENT_AMBULATORY_CARE_PROVIDER_SITE_OTHER): Payer: BC Managed Care – PPO

## 2020-12-25 ENCOUNTER — Other Ambulatory Visit: Payer: Self-pay

## 2020-12-25 ENCOUNTER — Telehealth: Payer: Self-pay | Admitting: Podiatry

## 2020-12-25 DIAGNOSIS — Z03818 Encounter for observation for suspected exposure to other biological agents ruled out: Secondary | ICD-10-CM | POA: Diagnosis not present

## 2020-12-25 DIAGNOSIS — M79671 Pain in right foot: Secondary | ICD-10-CM | POA: Diagnosis not present

## 2020-12-25 DIAGNOSIS — S91301D Unspecified open wound, right foot, subsequent encounter: Secondary | ICD-10-CM | POA: Diagnosis not present

## 2020-12-25 DIAGNOSIS — T84498A Other mechanical complication of other internal orthopedic devices, implants and grafts, initial encounter: Secondary | ICD-10-CM | POA: Diagnosis not present

## 2020-12-25 DIAGNOSIS — F1120 Opioid dependence, uncomplicated: Secondary | ICD-10-CM | POA: Diagnosis not present

## 2020-12-25 DIAGNOSIS — L03031 Cellulitis of right toe: Secondary | ICD-10-CM | POA: Diagnosis not present

## 2020-12-25 DIAGNOSIS — M869 Osteomyelitis, unspecified: Secondary | ICD-10-CM | POA: Diagnosis not present

## 2020-12-25 MED ORDER — AMOXICILLIN-POT CLAVULANATE 875-125 MG PO TABS: 1.0000 | ORAL_TABLET | Freq: Two times a day (BID) | ORAL | 0 refills | Status: DC

## 2020-12-25 NOTE — Telephone Encounter (Signed)
Patient called the office stating they needed to see Dr.wagoner as soon as possible due to a pin coming out of her foot after talking with my practice administrator I was able to schedule [name] an appointment on 12/25/20 at 3:15 pm.

## 2020-12-25 NOTE — Progress Notes (Signed)
Subjective: 60 year old female presents today for an acute appointment given exposed hardware to the skin on her right foot.  I last saw the patient in Sep 18, 2020 for a callus on the right foot that we have been treating over the last year or so.  We will be working on custom shoes as well as given the venous insufficiency bilaterally, flatfoot.  She been stable for the past year and has been walking.  She reports that about a week or so ago she started noticing screw becoming prominent on the foot.  She called the on-call provider on December 24, 2020 stating that the screws come out of her foot.  She was recommended the emergency department but she did not do this.  She called the office to be seen today I was happy to see her although she is frustrated that she call the office several times to be seen today.  Currently denies any fevers or chills.  She did follow-up with [facility] and she has a follow-up scheduled for the end of the month.   Objective: AAO x3, NAD DP/PT pulses palpable bilaterally, CRT less than 3 seconds Chronic venous insufficiency, lymphedema changes present bilaterally.  There is chronic erythema present of the right leg.  This does appear to be worsening compared to when I last saw her. There is exposed hardware through the skin on the right foot medial aspect.  I was easily able to remove the screw and the tip of the screw was removed.  This was in the soft tissue.  There is no purulence identified there is no surrounding erythema, ascending cellulitis.  There is no probing to bone although the wound does probe approximately 1.5 cm. No pain with calf compression, swelling, warmth, erythema  Assessment: Exposed hardware right foot, chronic lymphedema/venous insufficiency  Plan: -All treatment options discussed with the patient including all alternatives, risks, complications.  -She did not want any x-rays prior to me seeing her.  She also did not want to speak anybody  else in the office besides myself.  Upon evaluation there was a screw sticking out of the medial aspect of her foot which was loose and is able to remove this by hand without any complications and the tip of the screw came out.  I reviewed the x-rays that she had previously when she saw Dr. Cannon Kettle and there was a screw floating in the soft tissue and this is what is come loose.  Likely I do not think that this was in the bone at the time it came out.  I took x-rays after removal and the remainder of the hardware appears to be intact but any significant loosening.  On the oblique view there is 1 screw that is prominent but otherwise intact.  Old fractures noted to the second, third, fourth metatarsal necks. -Wound cultures obtained -I ordered blood work including CRP, sed rate, CBC, BMP. -CT scan ordered. -Referral to infectious disease. -Augmentin started. -Wound was irrigated and packed with iodoform packing.  I gave her some supplies for her to change the dressing at home as well. -Continue to monitor this area closely if there is any worsening for the emergency department.  We will see her back in 1 week or sooner if any issues are to arise. -I had a long conversation with her in regards to her options surgically.  I feel like at this point she needs to follow-up with orthopedics for any further surgical intervention at [facility] given the  extent of the deformity to her foot as well as her comorbidities. -Patient encouraged to call the office with any questions, concerns, change in symptoms.   Trula Slade DPM

## 2020-12-25 NOTE — Telephone Encounter (Signed)
Patient calling to state that she may go to the ER regarding hardware and screw coming out of foot. She really wanted to be seen today Chiropod[facility]), but there currently isnt a "regular" time slot to put her. If it is possible to get her worked in this afternoon or tomorrow please advise.

## 2020-12-29 DIAGNOSIS — F1120 Opioid dependence, uncomplicated: Secondary | ICD-10-CM | POA: Diagnosis not present

## 2020-12-29 DIAGNOSIS — Z7151 Drug abuse counseling and surveillance of drug abuser: Secondary | ICD-10-CM | POA: Diagnosis not present

## 2020-12-29 LAB — WOUND CULTURE
MICRO NUMBER:: 12230911
SPECIMEN QUALITY:: ADEQUATE

## 2020-12-31 DIAGNOSIS — Z7151 Drug abuse counseling and surveillance of drug abuser: Secondary | ICD-10-CM | POA: Diagnosis not present

## 2020-12-31 DIAGNOSIS — F1120 Opioid dependence, uncomplicated: Secondary | ICD-10-CM | POA: Diagnosis not present

## 2021-01-01 DIAGNOSIS — G894 Chronic pain syndrome: Secondary | ICD-10-CM | POA: Diagnosis not present

## 2021-01-01 DIAGNOSIS — M2550 Pain in unspecified joint: Secondary | ICD-10-CM | POA: Diagnosis not present

## 2021-01-01 DIAGNOSIS — M797 Fibromyalgia: Secondary | ICD-10-CM | POA: Diagnosis not present

## 2021-01-01 DIAGNOSIS — M792 Neuralgia and neuritis, unspecified: Secondary | ICD-10-CM | POA: Diagnosis not present

## 2021-01-02 ENCOUNTER — Other Ambulatory Visit: Payer: Self-pay

## 2021-01-02 ENCOUNTER — Encounter: Payer: Self-pay | Admitting: Infectious Disease

## 2021-01-02 ENCOUNTER — Ambulatory Visit (INDEPENDENT_AMBULATORY_CARE_PROVIDER_SITE_OTHER): Payer: BC Managed Care – PPO | Admitting: Podiatry

## 2021-01-02 ENCOUNTER — Ambulatory Visit (INDEPENDENT_AMBULATORY_CARE_PROVIDER_SITE_OTHER): Payer: BC Managed Care – PPO | Admitting: Infectious Disease

## 2021-01-02 ENCOUNTER — Telehealth: Payer: Self-pay | Admitting: *Deleted

## 2021-01-02 VITALS — BP 100/68 | HR 82 | Temp 97.8°F | Ht 63.0 in | Wt 208.0 lb

## 2021-01-02 DIAGNOSIS — Z22322 Carrier or suspected carrier of Methicillin resistant Staphylococcus aureus: Secondary | ICD-10-CM

## 2021-01-02 DIAGNOSIS — I890 Lymphedema, not elsewhere classified: Secondary | ICD-10-CM

## 2021-01-02 DIAGNOSIS — T84498A Other mechanical complication of other internal orthopedic devices, implants and grafts, initial encounter: Secondary | ICD-10-CM | POA: Diagnosis not present

## 2021-01-02 DIAGNOSIS — S91301D Unspecified open wound, right foot, subsequent encounter: Secondary | ICD-10-CM | POA: Diagnosis not present

## 2021-01-02 DIAGNOSIS — M0579 Rheumatoid arthritis with rheumatoid factor of multiple sites without organ or systems involvement: Secondary | ICD-10-CM

## 2021-01-02 DIAGNOSIS — M059 Rheumatoid arthritis with rheumatoid factor, unspecified: Secondary | ICD-10-CM

## 2021-01-02 DIAGNOSIS — M19071 Primary osteoarthritis, right ankle and foot: Secondary | ICD-10-CM | POA: Diagnosis not present

## 2021-01-02 DIAGNOSIS — I872 Venous insufficiency (chronic) (peripheral): Secondary | ICD-10-CM | POA: Diagnosis not present

## 2021-01-02 DIAGNOSIS — Z6838 Body mass index (BMI) 38.0-38.9, adult: Secondary | ICD-10-CM

## 2021-01-02 DIAGNOSIS — M19072 Primary osteoarthritis, left ankle and foot: Secondary | ICD-10-CM

## 2021-01-02 DIAGNOSIS — F1910 Other psychoactive substance abuse, uncomplicated: Secondary | ICD-10-CM

## 2021-01-02 DIAGNOSIS — F1121 Opioid dependence, in remission: Secondary | ICD-10-CM

## 2021-01-02 HISTORY — DX: Carrier or suspected carrier of methicillin resistant Staphylococcus aureus: Z22.322

## 2021-01-02 HISTORY — DX: Other mechanical complication of other internal orthopedic devices, implants and grafts, initial encounter: T84.498A

## 2021-01-02 NOTE — Telephone Encounter (Signed)
Patient is calling and wanting to speak with the doctor, imperative. Please call. Is seeing the physician at the moment.

## 2021-01-02 NOTE — Telephone Encounter (Signed)
Called [facility] for status  of CT scan, in process but not authorized as of yet. Patient is aware.

## 2021-01-02 NOTE — Progress Notes (Signed)
Subjective:   Reason for infectious disease consult: Exposed hardware and foot with an MRSA isolate from wound culture that is multidrug-resistant  Requesting physician: Celesta Gentile, DO  Orthopedic Consulting Physiciain: Suzan Slick   Patient ID: [name], female    DOB: [birth year], 60 y.o.   MRN: [id number]  HPI  [name] is a 60 year old Caucasian female with a history of chronic pain chronic opiate addiction on Suboxone also receiving ketamine infusions, who had hardware placed in both feet extensively  to correct her flatfoot and arthritic changes.   She also has morbid obesity edema and chronic venous stasis changes.  Or recently she developed loosening of a screw that became free-floating.  She was seen by Vedia Coffer at [facility] for evaluation and also seen by Dr. Earleen Newport last week and being seen this week.  Screw was sticking out of the medial aspect of foot and Dr. Earleen Newport was able to remove this at the bedside.  Plain films show the remainder of hardware still in place at least when last checked by x-ray.  She did not let Dr. Danford Bad get another x-ray at her last visit.  A culture was taken from the site where the screw was removed and this grew an MRSA organism that is resistant to most of our oral antibiotics but sensitive to vancomycin.  She has been referred to [place] for help being evaluated her further care.  A CT of the foot is still planned.  Clearly the concern is whether she has osteomyelitis associated with this hardware.  While she has chronic venous stasis changes bilaterally worse on the right she does not have evidence to me of active cellulitis.  She has been prescribed Augmentin and then clindamycin by some other providers which will clearly not be active against the MRSA if it is an offending agent here.    Past Medical History:  Diagnosis Date   Acute pericarditis    a. 04/2014 Cath: nl cors, EF 65-70%;  b. 04/2014 Echo: EF  65-70%, mild TR.   Allergy    Anal fissure    Anemia    Anxiety    Bipolar 1 disorder ([place])    Chronic lower back pain    Chronic pain    Exposed orthopaedic hardware ([place]) 01/02/2021   Fibromyalgia    GERD (gastroesophageal reflux disease)    HLD (hyperlipidemia)    Hypothyroidism    MRSA carrier 01/02/2021   RA (rheumatoid arthritis) ([place])    "all over"   Substance abuse ([place])    Wrist fracture    right    Past Surgical History:  Procedure Laterality Date   ABDOMINAL HYSTERECTOMY  03/2004   [place]; 1999   COLONOSCOPY     FOOT SURGERY Right 01/10/2016   GASTRIC BYPASS  2001   LEFT HEART CATHETERIZATION WITH CORONARY ANGIOGRAM N/A 04/23/2014   Normal coronaries and LVF   OPEN REDUCTION INTERNAL FIXATION (ORIF) DISTAL RADIAL FRACTURE Right 10/31/2017   Procedure: OPEN REDUCTION INTERNAL FIXATION (ORIF) DISTAL RADIAL FRACTURE;  Surgeon: Hiram Gash, MD;  Location: [facility];  Service: Orthopedics;  Laterality: Right;   SHOULDER ARTHROSCOPY W/ ROTATOR CUFF REPAIR Right ~ 2011   TARSAL METATARSAL FUSION WITH WEIL OSTEOTOMY Left 2013   TONSILLECTOMY     TOTAL SHOULDER ARTHROPLASTY      Family History  Problem Relation Age of Onset   Thyroid disease Mother    Colon polyps  Father    Colon polyps Sister    Heart attack Maternal Grandfather    Colon polyps Paternal Grandmother    Colon cancer Neg Hx       Social History   Socioeconomic History   Marital status: Divorced    Spouse name: Not on file   Number of children: 2   Years of education: Not on file   Highest education level: Not on file  Occupational History   Occupation: Unemployed  Tobacco Use   Smoking status: Every Day    Packs/day: 1.00    Years: 34.00    Pack years: 34.00    Types: Cigarettes   Smokeless tobacco: Never  Vaping Use   Vaping Use: Never used  Substance and Sexual Activity   Alcohol use: No   Drug use: Yes    Types: Cocaine    Comment: Has not used  cocaine since 05/12/2019   Sexual activity: Not Currently  Other Topics Concern   Not on file  Social History Narrative   Right handed   Lives in two story home alone with a stair lift.   Trying to get disability   Social Determinants of Health   Financial Resource Strain: Not on file  Food Insecurity: Not on file  Transportation Needs: Not on file  Physical Activity: Not on file  Stress: Not on file  Social Connections: Not on file    Allergies  Allergen Reactions   Amitriptyline Palpitations    Increases heart rate   Methotrexate Other (See Comments)     Current Outpatient Medications:    albuterol (VENTOLIN HFA) 108 (90 Base) MCG/ACT inhaler, Inhale 2 puffs into the lungs every 4 (four) hours as needed., Disp: , Rfl:    ALPRAZolam (XANAX) 1 MG tablet, Take 1 mg by mouth 2 (two) times daily., Disp: , Rfl:    amoxicillin-clavulanate (AUGMENTIN) 875-125 MG tablet, Take 1 tablet by mouth 2 (two) times daily., Disp: 20 tablet, Rfl: 0   ARIPiprazole (ABILIFY) 5 MG tablet, Take 5 mg by mouth daily., Disp: , Rfl:    bumetanide (BUMEX) 1 MG tablet, Take by mouth., Disp: , Rfl:    Buprenorphine HCl-Naloxone HCl 8-2 MG FILM, Place under the tongue 3 (three) times daily., Disp: , Rfl:    cyclobenzaprine (FLEXERIL) 10 MG tablet, Take 10 mg by mouth 3 (three) times daily., Disp: , Rfl:    diclofenac (VOLTAREN) 50 MG EC tablet, Take 50 mg by mouth 2 (two) times daily as needed., Disp: , Rfl:    doxycycline (MONODOX) 100 MG capsule, Take by mouth., Disp: , Rfl:    DULoxetine (CYMBALTA) 60 MG capsule, Take 120 mg by mouth daily. 2 '60mg'$ s in am, Disp: , Rfl:    DULoxetine (CYMBALTA) 60 MG capsule, 2 capsules, Disp: , Rfl:    ergocalciferol (VITAMIN D2) 1.25 MG (50000 UT) capsule, Take 1 capsule by mouth once a week., Disp: , Rfl:    ketorolac (TORADOL) 10 MG tablet, Take 10 mg by mouth 4 (four) times daily., Disp: , Rfl:    levocetirizine (XYZAL) 5 MG tablet, Take 5 mg by mouth daily., Disp:  , Rfl:    levothyroxine (SYNTHROID) 112 MCG tablet, Take by mouth., Disp: , Rfl:    levothyroxine (SYNTHROID) 137 MCG tablet, Take by mouth daily before breakfast. , Disp: , Rfl:    pregabalin (LYRICA) 200 MG capsule, Take 200 mg by mouth 2 (two) times daily., Disp: , Rfl:    pregabalin (LYRICA) 200 MG capsule,  1 capsule, Disp: , Rfl:    torsemide (DEMADEX) 20 MG tablet, Take 1 tablet (20 mg total) by mouth 2 (two) times daily., Disp: 60 tablet, Rfl: 1   traZODone (DESYREL) 100 MG tablet, Take 100 mg by mouth at bedtime. , Disp: , Rfl:    VALIUM 5 MG tablet, Take by mouth., Disp: , Rfl:   Current Facility-Administered Medications:    triamcinolone acetonide (KENALOG) 10 MG/ML injection 10 mg, 10 mg, Other, Once, Trula Slade, DPM   Review of Systems  Constitutional:  Negative for activity change, appetite change, chills, diaphoresis, fatigue, fever and unexpected weight change.  HENT:  Negative for congestion, drooling, rhinorrhea, sinus pressure, sneezing, sore throat and trouble swallowing.   Eyes:  Negative for photophobia and visual disturbance.  Respiratory:  Negative for cough, chest tightness, shortness of breath, wheezing and stridor.   Cardiovascular:  Negative for chest pain, palpitations and leg swelling.  Gastrointestinal:  Negative for abdominal distention, abdominal pain, anal bleeding, blood in stool, constipation, diarrhea, nausea and vomiting.  Genitourinary:  Negative for difficulty urinating, dysuria, flank pain and hematuria.  Musculoskeletal:  Positive for arthralgias and myalgias. Negative for back pain, gait problem and joint swelling.  Skin:  Positive for color change. Negative for pallor, rash and wound.  Neurological:  Negative for dizziness, tremors, weakness, light-headedness and headaches.  Hematological:  Negative for adenopathy. Does not bruise/bleed easily.  Psychiatric/Behavioral:  Positive for decreased concentration. Negative for agitation, behavioral  problems, confusion, dysphoric mood, hallucinations, sleep disturbance and suicidal ideas. The patient is not hyperactive.       Objective:   Physical Exam Constitutional:      General: She is not in acute distress.    Appearance: Normal appearance. She is well-developed. She is obese. She is not ill-appearing or diaphoretic.  HENT:     Head: Normocephalic and atraumatic.     Right Ear: Hearing and external ear normal.     Left Ear: Hearing and external ear normal.     Nose: No nasal deformity or rhinorrhea.  Eyes:     General: No scleral icterus.    Extraocular Movements: Extraocular movements intact.     Conjunctiva/sclera: Conjunctivae normal.     Right eye: Right conjunctiva is not injected.     Left eye: Left conjunctiva is not injected.     Pupils: Pupils are equal, round, and reactive to light.  Neck:     Vascular: No JVD.  Cardiovascular:     Rate and Rhythm: Normal rate and regular rhythm.     Heart sounds: S1 normal and S2 normal.  Pulmonary:     Effort: Pulmonary effort is normal. No respiratory distress.     Breath sounds: No wheezing.  Abdominal:     General: There is no distension.     Palpations: Abdomen is soft.     Tenderness: There is no abdominal tenderness.  Musculoskeletal:        General: Normal range of motion.     Right shoulder: Normal.     Left shoulder: Normal.     Cervical back: Normal range of motion and neck supple.     Right hip: Normal.     Left hip: Normal.     Right knee: Normal.     Left knee: Normal.  Lymphadenopathy:     Head:     Right side of head: No submandibular, preauricular or posterior auricular adenopathy.     Left side of head: No submandibular, preauricular or  posterior auricular adenopathy.     Cervical: No cervical adenopathy.     Right cervical: No superficial or deep cervical adenopathy.    Left cervical: No superficial or deep cervical adenopathy.  Skin:    General: Skin is warm and dry.     Coloration: Skin is not  pale.     Findings: Erythema present. No abrasion, bruising, ecchymosis, lesion or rash.     Nails: There is no clubbing.  Neurological:     General: No focal deficit present.     Mental Status: She is alert and oriented to person, place, and time.     Sensory: No sensory deficit.     Coordination: Coordination normal.     Gait: Gait normal.  Psychiatric:        Attention and Perception: She is attentive.        Speech: Speech normal.        Behavior: Behavior normal. Behavior is cooperative.        Thought Content: Thought content normal.        Judgment: Judgment normal.    Lower extremities 01/02/2021:    Left foot 01/02/2021:    Right foot 01/02/2021 and close-up of the area with the pin was removed.         Assessment & Plan:   Exposed hardware in right foot with screw that became free-floating and was removed at the bedside:  The most important question is to determine if she has evidence of hardware associated osteomyelitis.  She has an upcoming appointment with [facility] on Monday.  Dr. Earleen Newport is also ordered a CT scan of the foot.  I have scheduled her to see me next week on Wednesday after she has had orthopedic appointment and hopefully she will of had a CT scan by that time.  That point time also reorder sedimentation rate C-reactive protein comprehensive metabolic panel and CBC with differential.  IF she has osteomyelitis and IF she has surgery (she would refuse BKA she tells me) to remove hardware and deep operative cultures can be obtained that could be the most helpful diagnostic maneuver to help guide further antibiotic therapy.  To that end I would like her to stop taking the clindamycin as it could obscure culture results.  If the MRSA isolated from wound culture turns out to be an offending agent and a deep infection and she has hardware and is (as she states unwilling to have BKA ) on oral suppressive therapy may be very  difficult.  I am also not terribly enthusiastic about the idea of IV antibiotics given her substance abuse history.  We will look into the possibility of Tedizolid which would be active and could be given for protracted time though not indefinitely if we use linezolid we would have to have her stop her Cymbalta due to risk of serotonin syndrome.  Polysubstance abuse history: She did admit to me due to using heroin although she claims that she only has snorted it she says that she "died at 1 point when she ingested what she believes was fentanyl.  He claims he does not hepatitis C or B I see that she had a negative HIV test in 2020.  We will see if I can convince her to let me retest her for hepatitis C B and a as well as HIV at her next visit.  She of rheumatoid arthritis: Followed by Dr. Gypsy Lore in the past this could complicate interpretation of inflammatory markers she  does not appear to be on any agents for that at this time point in time.  I spent 81 minutes with the patient including greater than 50% of the time in face to face regarding the nature of superficial infections deep infection including hardware associated bone infections their management which involves surgery and antibiotics, the tree of the MRSA that was isolated from her wound culture, along with personal review of her plain films of her foot on 21 July which show the hardware that is in place as well as the floating screw review of Dr. Pasty Arch notes as well as note from [facility] and in review of medical records in preparation for the visit and during the visit and in coordination of her care.

## 2021-01-03 ENCOUNTER — Telehealth: Payer: Self-pay | Admitting: Podiatry

## 2021-01-03 NOTE — Progress Notes (Signed)
Subjective: 60 year old female presents today as a walk-in appointment.  I was rounding at the hospital and was called by our office manager to come see the patient as she was very irate and upset at the front desk and she was demanding to be seen.  She was trying to come in the back clinical area on her own demanded to be seen by somebody.  Earlier she had an appointment in our [place] office at 11:45.  I was in the office and left there at 12:18pm and she had not arrived but she states that she was there.  I was happy to come back to see her in the [place] office when I found out she was upset.  The patient was upset after seeing infectious disease earlier today.  She states that she does not want to stop taking antibiotics for fear of infection.  She has a follow-up with [facility] on Monday.  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  She has a family member who is also a nurse who has been wrapping her legs.  She did not get the blood work that I ordered.  She currently denies any fevers or chills.  Objective: AAO x3, NAD DP/PT pulses palpable bilaterally, CRT less than 3 seconds Chronic venous insufficiency, lymphedema changes present bilaterally with the right side worse than left.  There is chronic erythema present of the right leg.  It appears to be the same compared to when I saw her last week. The wound on the medial aspect of the foot from where the screws come out is almost healed.  Superficial wound is present with any probing, undermining or tunneling.  There is no drainage or pus.  No fluctuance or crepitation.  Although there is significant edema to the leg the foot itself does not have significant erythema and there is no warmth of the foot.  No significant erythema.  No pain with calf compression, swelling, warmth, erythema  Assessment: Exposed hardware right foot, chronic lymphedema/venous  insufficiency  Plan: -All treatment options discussed with the patient including all alternatives, risks, complications.  -Ongoing discussion with the patient regards to treatment options.  She does not want to stop taking antibiotics but we discussed that if she were to have the hardware removed in the near future biopsy, cultures will need to be obtained that could obscure the results.  She understands this but can continue with antibiotics she states.  I recommended a Unna boot today but she did not want this that she was told that all the fluid in her legs would come out but that was applied and not to apply any compression.  I did wrap the legs with Kerlix as well as an Ace bandage today.  I encouraged her to follow-up with [facility] on Monday but there is any worsening she is to report to the emergency department.  I gave the patient a copy of her x-rays while in the office today from previous to bring with her to her appointment.  We will follow-up on the CT scan as well.  She also did not get the blood work that I ordered.  Trula Slade DPM

## 2021-01-03 NOTE — Telephone Encounter (Signed)
Patient has called twice this morning. She is upset that her appointment at [facility] for Monday has been rescheduled for next Thursday and is with the PA who she already saw. She called me to see what to do. I will attempt to call [facility] on Monday about a sooner appointment. However I let her know if there is any worsening of infection or any changes over the weekend then she needs to go to the ER.   She called back and apparently called [facility] again and they recommended going to the nearest emergency room. She does not want to go to the ER. She does state that the leg/foot is stable. She has not seen any drainage. No fevers, chills or systemic signs of infection. I told her that I will call [facility] Monday morning but if there is any worsening of infection over the weekend then she needs to go to the ER.   I tried to explain to her that if she goes to [facility] and wanted to be transferred to [facility] we would have to find a physician to accept her there. I told her she could go to the hospital but states she does not want to and she is going to watch it over the weekend and should there be any changes I encouraged her to go to the ER.   Will follow up on Monday.    --  Side note- we contacted [facility] yesterday and the authorization is still pending for CT scan. I was told nothing else I needed to do at this point.   She is upset that no blood work was done yesterday. I have ordered blood work to be done when I saw her in the office last week and she did not get this done. Encouraged her to get this done.   --

## 2021-01-04 ENCOUNTER — Other Ambulatory Visit: Payer: Self-pay

## 2021-01-04 ENCOUNTER — Encounter (HOSPITAL_COMMUNITY): Payer: Self-pay | Admitting: Emergency Medicine

## 2021-01-04 ENCOUNTER — Emergency Department (HOSPITAL_COMMUNITY)
Admission: EM | Admit: 2021-01-04 | Discharge: 2021-01-04 | Disposition: A | Discharge status: Home / Self Care | Payer: BC Managed Care – PPO | Attending: Emergency Medicine | Admitting: Emergency Medicine

## 2021-01-04 DIAGNOSIS — Z79899 Other long term (current) drug therapy: Secondary | ICD-10-CM | POA: Diagnosis not present

## 2021-01-04 DIAGNOSIS — L03115 Cellulitis of right lower limb: Secondary | ICD-10-CM | POA: Diagnosis not present

## 2021-01-04 DIAGNOSIS — Z96619 Presence of unspecified artificial shoulder joint: Secondary | ICD-10-CM | POA: Diagnosis not present

## 2021-01-04 DIAGNOSIS — E039 Hypothyroidism, unspecified: Secondary | ICD-10-CM | POA: Insufficient documentation

## 2021-01-04 DIAGNOSIS — L089 Local infection of the skin and subcutaneous tissue, unspecified: Secondary | ICD-10-CM | POA: Diagnosis not present

## 2021-01-04 DIAGNOSIS — F17210 Nicotine dependence, cigarettes, uncomplicated: Secondary | ICD-10-CM | POA: Insufficient documentation

## 2021-01-04 DIAGNOSIS — R21 Rash and other nonspecific skin eruption: Secondary | ICD-10-CM | POA: Diagnosis not present

## 2021-01-04 DIAGNOSIS — R0902 Hypoxemia: Secondary | ICD-10-CM | POA: Diagnosis not present

## 2021-01-04 LAB — CBC WITH DIFFERENTIAL/PLATELET
Abs Immature Granulocytes: 0.02 10*3/uL (ref 0.00–0.07)
Basophils Absolute: 0.1 10*3/uL (ref 0.0–0.1)
Basophils Relative: 1 %
Eosinophils Absolute: 0.2 10*3/uL (ref 0.0–0.5)
Eosinophils Relative: 2 %
HCT: 39.7 % (ref 36.0–46.0)
Hemoglobin: 13.1 g/dL (ref 12.0–15.0)
Immature Granulocytes: 0 %
Lymphocytes Relative: 33 %
Lymphs Abs: 2.4 10*3/uL (ref 0.7–4.0)
MCH: 31.6 pg (ref 26.0–34.0)
MCHC: 33 g/dL (ref 30.0–36.0)
MCV: 95.7 fL (ref 80.0–100.0)
Monocytes Absolute: 0.5 10*3/uL (ref 0.1–1.0)
Monocytes Relative: 7 %
Neutro Abs: 4.2 10*3/uL (ref 1.7–7.7)
Neutrophils Relative %: 57 %
Platelets: 279 10*3/uL (ref 150–400)
RBC: 4.15 MIL/uL (ref 3.87–5.11)
RDW: 13.2 % (ref 11.5–15.5)
WBC: 7.4 10*3/uL (ref 4.0–10.5)
nRBC: 0 % (ref 0.0–0.2)

## 2021-01-04 LAB — COMPREHENSIVE METABOLIC PANEL
ALT: 12 U/L (ref 0–44)
AST: 16 U/L (ref 15–41)
Albumin: 3.2 g/dL — ABNORMAL LOW (ref 3.5–5.0)
Alkaline Phosphatase: 71 U/L (ref 38–126)
Anion gap: 6 (ref 5–15)
BUN: 8 mg/dL (ref 6–20)
CO2: 31 mmol/L (ref 22–32)
Calcium: 8.8 mg/dL — ABNORMAL LOW (ref 8.9–10.3)
Chloride: 100 mmol/L (ref 98–111)
Creatinine, Ser: 0.81 mg/dL (ref 0.44–1.00)
GFR, Estimated: >60 mL/min (ref >60)
Glucose, Bld: 80 mg/dL (ref 70–99)
Potassium: 4.2 mmol/L (ref 3.5–5.1)
Sodium: 137 mmol/L (ref 135–145)
Total Bilirubin: 0.5 mg/dL (ref 0.3–1.2)
Total Protein: 6.1 g/dL — ABNORMAL LOW (ref 6.5–8.1)

## 2021-01-04 LAB — LACTIC ACID, PLASMA
Lactic Acid, Venous: 0.6 mmol/L (ref 0.5–1.9)
Lactic Acid, Venous: 0.6 mmol/L (ref 0.5–1.9)

## 2021-01-04 MED ORDER — VANCOMYCIN HCL 1500 MG/300ML IV SOLN
1500.0000 mg | Freq: Once | INTRAVENOUS | Status: AC
  Administered 2021-01-04: 1500 mg via INTRAVENOUS
  Filled 2021-01-04: qty 300

## 2021-01-04 MED ORDER — SULFAMETHOXAZOLE-TRIMETHOPRIM 800-160 MG PO TABS: 1.0000 | ORAL_TABLET | Freq: Two times a day (BID) | ORAL | 0 refills | Status: AC

## 2021-01-04 MED ORDER — VANCOMYCIN HCL IN DEXTROSE 1-5 GM/200ML-% IV SOLN
1000.0000 mg | Freq: Once | INTRAVENOUS | Status: DC
  Filled 2021-01-04: qty 200

## 2021-01-04 NOTE — ED Notes (Signed)
Patient ambulated to the [facility] with assistance, pt currently in bed and call light placed within reach.

## 2021-01-04 NOTE — ED Notes (Signed)
Pt stated she just finished  PO abx over 1 week for a MRSA infection to her rt lower leg.  Says it looks a little better than before.  She reports Less drainage and redness but still concerned about redness and swelling.

## 2021-01-04 NOTE — ED Triage Notes (Addendum)
Pt reports right lower leg redness, swelling, and pain.  States she was diagnosed with MRSA and taking antibiotic without improvement.  Denies fever and chills.

## 2021-01-04 NOTE — ED Notes (Signed)
Left lower leg wrapped with non-stick dressing, kurlex and ace wraps

## 2021-01-04 NOTE — Discharge Instructions (Addendum)
Follow-up later this week as planned with your doctor at [facility]

## 2021-01-04 NOTE — ED Provider Notes (Signed)
[facility] EMERGENCY DEPARTMENT Provider Note   CSN: [id number] Arrival date & time: 01/04/21  1455     History No chief complaint on file.   [name] is a 60 y.o. female.  Patient is on Cleocin for possible cellulitis to right lower leg.  This has been going on she states for a year and is going back to [facility] soon for possible surgery.  The history is provided by the patient and medical records. No language interpreter was used.  Rash Location: Right lower leg. Quality: dryness   Severity:  Mild Onset quality:  Gradual Duration:  30 weeks Timing:  Constant Chronicity:  New Context: not animal contact   Relieved by:  Nothing Worsened by:  Nothing Ineffective treatments:  None tried Associated symptoms: no abdominal pain, no diarrhea, no fatigue and no headaches       Past Medical History:  Diagnosis Date   Acute pericarditis    a. 04/2014 Cath: nl cors, EF 65-70%;  b. 04/2014 Echo: EF 65-70%, mild TR.   Allergy    Anal fissure    Anemia    Anxiety    Bipolar 1 disorder ([place])    Chronic lower back pain    Chronic pain    Exposed orthopaedic hardware ([place]) 01/02/2021   Fibromyalgia    GERD (gastroesophageal reflux disease)    HLD (hyperlipidemia)    Hypothyroidism    MRSA carrier 01/02/2021   RA (rheumatoid arthritis) ([place])    "all over"   Substance abuse ([place])    Wrist fracture    right    Patient Active Problem List   Diagnosis Date Noted   Exposed orthopaedic hardware ([place]) 01/02/2021   MRSA carrier 01/02/2021   Body mass index (BMI) 38.0-38.9, adult 12/04/2020   DDD (degenerative disc disease), cervical 12/04/2020   Primary osteoarthritis 12/04/2020   Chronic venous insufficiency 08/13/2019   Lymphedema 08/13/2019   Acute encephalopathy 05/12/2019   Rheumatoid arthritis involving multiple sites with positive rheumatoid factor ([place]) 05/21/2016   Primary osteoarthritis of both hands 05/21/2016   Primary osteoarthritis of both  feet 05/21/2016   Primary osteoarthritis of both knees 05/21/2016   Primary osteoarthritis of right shoulder 05/21/2016   Acute pericarditis 04/23/2014   Rheumatoid arthritis ([place]) 04/11/2012   History of narcotic addiction ([place]) 03/31/2012   Fibromyalgia 03/31/2012    Past Surgical History:  Procedure Laterality Date   ABDOMINAL HYSTERECTOMY  03/2004   [place]; [place] Right 01/10/2016   GASTRIC BYPASS  2001   LEFT HEART CATHETERIZATION WITH CORONARY ANGIOGRAM N/A 04/23/2014   Normal coronaries and LVF   OPEN REDUCTION INTERNAL FIXATION (ORIF) DISTAL RADIAL FRACTURE Right 10/31/2017   Procedure: OPEN REDUCTION INTERNAL FIXATION (ORIF) DISTAL RADIAL FRACTURE;  Surgeon: Hiram Gash, MD;  Location: [facility];  Service: Orthopedics;  Laterality: Right;   SHOULDER ARTHROSCOPY W/ ROTATOR CUFF REPAIR Right ~ 2011   TARSAL METATARSAL FUSION WITH WEIL OSTEOTOMY Left 2013   TONSILLECTOMY     TOTAL SHOULDER ARTHROPLASTY       OB History   No obstetric history on file.     Family History  Problem Relation Age of Onset   Thyroid disease Mother    Colon polyps Father    Colon polyps Sister    Heart attack Maternal Grandfather    Colon polyps Paternal Grandmother    Colon cancer Neg Hx  Social History   Tobacco Use   Smoking status: Every Day    Packs/day: 1.00    Years: 34.00    Pack years: 34.00    Types: Cigarettes   Smokeless tobacco: Never  Vaping Use   Vaping Use: Never used  Substance Use Topics   Alcohol use: No   Drug use: Not Currently    Types: Cocaine    Comment: Has not used cocaine since 05/12/2019    Home Medications Prior to Admission medications   Medication Sig Start Date End Date Taking? Authorizing Provider  sulfamethoxazole-trimethoprim (BACTRIM DS) 800-160 MG tablet Take 1 tablet by mouth 2 (two) times daily for 7 days. 01/04/21 01/11/21 Yes Milton Ferguson, MD  albuterol (VENTOLIN HFA) 108  (90 Base) MCG/ACT inhaler Inhale 2 puffs into the lungs every 4 (four) hours as needed. 02/22/19   [provider]  ALPRAZolam Duanne Moron) 1 MG tablet Take 1 mg by mouth 2 (two) times daily. 07/23/19   [provider]  ARIPiprazole (ABILIFY) 5 MG tablet Take 5 mg by mouth daily. 09/03/19   [provider]  bumetanide (BUMEX) 1 MG tablet Take by mouth. 10/03/20   [provider]  Buprenorphine HCl-Naloxone HCl 8-2 MG FILM Place under the tongue 3 (three) times daily. 07/25/19   [provider]  clindamycin (CLEOCIN) 300 MG capsule Take 300 mg by mouth 4 (four) times daily. 12/30/20   [provider]  cyclobenzaprine (FLEXERIL) 10 MG tablet Take 10 mg by mouth 3 (three) times daily. Patient not taking: Reported on 01/02/2021 11/27/20   [provider]  diclofenac (VOLTAREN) 50 MG EC tablet Take 50 mg by mouth 2 (two) times daily as needed. Patient not taking: Reported on 01/02/2021 01/25/19   [provider]  doxycycline (MONODOX) 100 MG capsule Take by mouth. Patient not taking: Reported on 01/02/2021 10/03/20   [provider]  DULoxetine (CYMBALTA) 60 MG capsule Take 120 mg by mouth daily. 2 '60mg'$ s in am    [provider]  DULoxetine (CYMBALTA) 60 MG capsule 2 capsules    [provider]  ergocalciferol (VITAMIN D2) 1.25 MG (50000 UT) capsule Take 1 capsule by mouth once a week. 10/06/20   [provider]  ketorolac (TORADOL) 10 MG tablet Take 10 mg by mouth 4 (four) times daily. Patient not taking: Reported on 01/02/2021 11/27/20   [provider]  levocetirizine (XYZAL) 5 MG tablet Take 5 mg by mouth daily. Patient not taking: Reported on 01/02/2021 01/26/19   [provider]  levothyroxine (SYNTHROID) 112 MCG tablet Take by mouth. Patient not taking: Reported on 01/02/2021 10/03/20   [provider]  levothyroxine (SYNTHROID) 137 MCG tablet Take by mouth daily before breakfast.      [provider]  pregabalin (LYRICA) 200 MG capsule Take 200 mg by mouth 2 (two) times daily.    [provider]  pregabalin (LYRICA) 200 MG capsule 1 capsule    [provider]  torsemide (DEMADEX) 20 MG tablet Take 1 tablet (20 mg total) by mouth 2 (two) times daily. 07/24/19 10/24/19  Adrian Prows, MD  traZODone (DESYREL) 100 MG tablet Take 100 mg by mouth at bedtime.     [provider]  VALIUM 5 MG tablet Take by mouth. Patient not taking: Reported on 01/02/2021 01/08/20   [provider]    Allergies    Amitriptyline and Methotrexate  Review of Systems   Review of Systems  Constitutional:  Negative for appetite change  and fatigue.  HENT:  Negative for congestion, ear discharge and sinus pressure.   Eyes:  Negative for discharge.  Respiratory:  Negative for cough.   Cardiovascular:  Negative for chest pain.  Gastrointestinal:  Negative for abdominal pain and diarrhea.  Genitourinary:  Negative for frequency and hematuria.  Musculoskeletal:  Negative for back pain.       Swollen red right lower leg  Skin:  Positive for rash.  Neurological:  Negative for seizures and headaches.  Psychiatric/Behavioral:  Negative for hallucinations.    Physical Exam Updated Vital Signs BP 128/80   Pulse (!) 57   Temp (!) 97.5 F (36.4 C) (Oral)   Resp 20   SpO2 98%   Physical Exam Vitals and nursing note reviewed.  Constitutional:      Appearance: She is well-developed.  HENT:     Head: Normocephalic.     Nose: Nose normal.  Eyes:     General: No scleral icterus.    Conjunctiva/sclera: Conjunctivae normal.  Neck:     Thyroid: No thyromegaly.  Cardiovascular:     Rate and Rhythm: Normal rate and regular rhythm.     Heart sounds: No murmur heard.   No friction rub. No gallop.  Pulmonary:     Breath sounds: No stridor. No wheezing or rales.  Chest:     Chest wall: No tenderness.  Abdominal:     General: There is no distension.      Tenderness: There is no abdominal tenderness. There is no rebound.  Musculoskeletal:        General: Normal range of motion.     Cervical back: Neck supple.     Comments: Swelling with right lower leg.  Possible cellulitis.  Lymphadenopathy:     Cervical: No cervical adenopathy.  Skin:    Findings: No erythema or rash.  Neurological:     Mental Status: She is alert and oriented to person, place, and time.     Motor: No abnormal muscle tone.     Coordination: Coordination normal.  Psychiatric:        Behavior: Behavior normal.    ED Results / Procedures / Treatments   Labs (all labs ordered are listed, but only abnormal results are displayed) Labs Reviewed  COMPREHENSIVE METABOLIC PANEL - Abnormal; Notable for the following components:      Result Value   Calcium 8.8 (*)    Total Protein 6.1 (*)    Albumin 3.2 (*)    All other components within normal limits  LACTIC ACID, PLASMA  LACTIC ACID, PLASMA  CBC WITH DIFFERENTIAL/PLATELET    EKG None  Radiology No results found.  Procedures Procedures   Medications Ordered in ED Medications  vancomycin (VANCOREADY) IVPB 1500 mg/300 mL (1,500 mg Intravenous New Bag/Given 01/04/21 1729)    ED Course  I have reviewed the triage vital signs and the nursing notes.  Pertinent labs & imaging results that were available during my care of the patient were reviewed by me and considered in my medical decision making (see chart for details).    MDM Rules/Calculators/A&P                           Patient is nontoxic.  Labs unremarkable.  She will be started on Bactrim add to the Cleocin and get checked this week by her doctor at [facility] Impression(s) / ED Diagnoses Final diagnoses:  Cellulitis of right lower extremity  Rx / DC Orders ED Discharge Orders          Ordered    sulfamethoxazole-trimethoprim (BACTRIM DS) 800-160 MG tablet  2 times daily        01/04/21 Magdalen Spatz,  MD 01/05/21 [phone]

## 2021-01-05 ENCOUNTER — Telehealth: Payer: Self-pay | Admitting: *Deleted

## 2021-01-05 ENCOUNTER — Telehealth: Payer: Self-pay | Admitting: Podiatry

## 2021-01-05 DIAGNOSIS — Z7151 Drug abuse counseling and surveillance of drug abuser: Secondary | ICD-10-CM | POA: Diagnosis not present

## 2021-01-05 DIAGNOSIS — F1120 Opioid dependence, uncomplicated: Secondary | ICD-10-CM | POA: Diagnosis not present

## 2021-01-05 NOTE — Telephone Encounter (Signed)
I attempted to call patient back to see if she had any further questions/concerns. Rang twice then went to voicemail. Left message to call back if needed.

## 2021-01-05 NOTE — Telephone Encounter (Signed)
Called and spoke with [name] at [facility] , have no availability until Sept. They have an opening in [place] with NP(Samantha Schweitzer)'@11'$ :00,. I informed patient of this,will call back if she decides to take that appointment or keep existing  one on Thursday w/ Dr Amendola'@11'$ :00. Is wanting to make sure she is seeing a good doctor.

## 2021-01-05 NOTE — Telephone Encounter (Signed)
Patient is requesting a call back.

## 2021-01-05 NOTE — Telephone Encounter (Signed)
Patient returned call back and she is going to keep appointment on Thursday'@11'$ :00 at [facility].

## 2021-01-07 ENCOUNTER — Ambulatory Visit: Payer: BC Managed Care – PPO | Admitting: Infectious Disease

## 2021-01-07 DIAGNOSIS — F1120 Opioid dependence, uncomplicated: Secondary | ICD-10-CM | POA: Diagnosis not present

## 2021-01-07 NOTE — Telephone Encounter (Signed)
Called and spoke with Prentiss Bells at [facility] and I stated I was calling to see what the status of the CT was and Prentiss Bells stated that it was still in authorization and that she would call the patient and schedule patient out. Lattie Haw

## 2021-01-08 DIAGNOSIS — M778 Other enthesopathies, not elsewhere classified: Secondary | ICD-10-CM | POA: Diagnosis not present

## 2021-01-08 DIAGNOSIS — G8929 Other chronic pain: Secondary | ICD-10-CM | POA: Diagnosis not present

## 2021-01-08 DIAGNOSIS — M2141 Flat foot [pes planus] (acquired), right foot: Secondary | ICD-10-CM | POA: Diagnosis not present

## 2021-01-08 DIAGNOSIS — M19071 Primary osteoarthritis, right ankle and foot: Secondary | ICD-10-CM | POA: Diagnosis not present

## 2021-01-08 DIAGNOSIS — M19072 Primary osteoarthritis, left ankle and foot: Secondary | ICD-10-CM | POA: Diagnosis not present

## 2021-01-08 DIAGNOSIS — Z981 Arthrodesis status: Secondary | ICD-10-CM | POA: Diagnosis not present

## 2021-01-08 DIAGNOSIS — M2142 Flat foot [pes planus] (acquired), left foot: Secondary | ICD-10-CM | POA: Diagnosis not present

## 2021-01-09 ENCOUNTER — Telehealth: Payer: Self-pay | Admitting: *Deleted

## 2021-01-09 NOTE — Telephone Encounter (Signed)
Called and spoke with the patient and patient stated that her dad has over rode the authorization and that if any one had an issue to call the dad at [phone] Marlou Sa) and I stated that I would relay the message and that Dr Jacqualyn Posey is still trying to get the authorization for the CT at [place] and spoke with Solmon Ice and Solmon Ice stated that they left a voice mail for the patient to call [facility] and I called the patient back to let her know. Lattie Haw

## 2021-01-12 DIAGNOSIS — F1120 Opioid dependence, uncomplicated: Secondary | ICD-10-CM | POA: Diagnosis not present

## 2021-01-12 DIAGNOSIS — Z7151 Drug abuse counseling and surveillance of drug abuser: Secondary | ICD-10-CM | POA: Diagnosis not present

## 2021-01-13 ENCOUNTER — Telehealth: Payer: Self-pay | Admitting: *Deleted

## 2021-01-13 ENCOUNTER — Other Ambulatory Visit: Payer: BC Managed Care – PPO

## 2021-01-13 NOTE — Telephone Encounter (Signed)
-----   Message from Trula Slade, DPM sent at 12/26/2020  2:54 PM EDT ----- I ordered a CT scan of her right foot. Can someone please follow up on this? Thanks!

## 2021-01-13 NOTE — Telephone Encounter (Signed)
CT is scheduled for 01-20-2021 at [facility]. [name]

## 2021-01-14 DIAGNOSIS — F1120 Opioid dependence, uncomplicated: Secondary | ICD-10-CM | POA: Diagnosis not present

## 2021-01-15 DIAGNOSIS — Z7151 Drug abuse counseling and surveillance of drug abuser: Secondary | ICD-10-CM | POA: Diagnosis not present

## 2021-01-15 DIAGNOSIS — F1120 Opioid dependence, uncomplicated: Secondary | ICD-10-CM | POA: Diagnosis not present

## 2021-01-20 ENCOUNTER — Ambulatory Visit
Admission: RE | Admit: 2021-01-20 | Discharge: 2021-01-20 | Disposition: A | Discharge status: Home / Self Care | Payer: BC Managed Care – PPO | Source: Ambulatory Visit | Attending: Podiatry | Admitting: Podiatry

## 2021-01-20 ENCOUNTER — Telehealth: Payer: Self-pay

## 2021-01-20 DIAGNOSIS — S91301D Unspecified open wound, right foot, subsequent encounter: Secondary | ICD-10-CM

## 2021-01-20 DIAGNOSIS — Q666 Other congenital valgus deformities of feet: Secondary | ICD-10-CM | POA: Diagnosis not present

## 2021-01-20 DIAGNOSIS — M7731 Calcaneal spur, right foot: Secondary | ICD-10-CM | POA: Diagnosis not present

## 2021-01-20 DIAGNOSIS — M7989 Other specified soft tissue disorders: Secondary | ICD-10-CM | POA: Diagnosis not present

## 2021-01-20 DIAGNOSIS — T84498A Other mechanical complication of other internal orthopedic devices, implants and grafts, initial encounter: Secondary | ICD-10-CM

## 2021-01-20 DIAGNOSIS — Z981 Arthrodesis status: Secondary | ICD-10-CM | POA: Diagnosis not present

## 2021-01-20 NOTE — Telephone Encounter (Signed)
Patient called office today to follow up on her visit from 8/19. Would like to know if she has cured her MRSA infection. Patient has not followed up with office since initial visit. Has been following podiatry team. Is scheduled for CT of right foot today at [facility]. Would like for provider to review these once available.  Pt is scheduled to follow up with ID team on 9/29. Would like a call if possible before visit to discuss results. Leatrice Jewels, RMA

## 2021-01-22 DIAGNOSIS — F1120 Opioid dependence, uncomplicated: Secondary | ICD-10-CM | POA: Diagnosis not present

## 2021-01-22 DIAGNOSIS — Z03818 Encounter for observation for suspected exposure to other biological agents ruled out: Secondary | ICD-10-CM | POA: Diagnosis not present

## 2021-01-23 NOTE — Telephone Encounter (Signed)
Addendum: her work doctor is NOT going to refill the clinda. We discussed complications of antibiotics such as C. Diff.

## 2021-01-26 DIAGNOSIS — Z7151 Drug abuse counseling and surveillance of drug abuser: Secondary | ICD-10-CM | POA: Diagnosis not present

## 2021-01-26 DIAGNOSIS — F1120 Opioid dependence, uncomplicated: Secondary | ICD-10-CM | POA: Diagnosis not present

## 2021-02-03 DIAGNOSIS — F1120 Opioid dependence, uncomplicated: Secondary | ICD-10-CM | POA: Diagnosis not present

## 2021-02-04 DIAGNOSIS — Z7151 Drug abuse counseling and surveillance of drug abuser: Secondary | ICD-10-CM | POA: Diagnosis not present

## 2021-02-04 DIAGNOSIS — F1120 Opioid dependence, uncomplicated: Secondary | ICD-10-CM | POA: Diagnosis not present

## 2021-02-09 DIAGNOSIS — F1120 Opioid dependence, uncomplicated: Secondary | ICD-10-CM | POA: Diagnosis not present

## 2021-02-09 DIAGNOSIS — Z7151 Drug abuse counseling and surveillance of drug abuser: Secondary | ICD-10-CM | POA: Diagnosis not present

## 2021-02-09 DIAGNOSIS — M170 Bilateral primary osteoarthritis of knee: Secondary | ICD-10-CM | POA: Diagnosis not present

## 2021-02-10 ENCOUNTER — Telehealth: Payer: Self-pay

## 2021-02-10 NOTE — Telephone Encounter (Signed)
Received call from NP affiliated with father's clinic, [facility]. She states that she obtained a wound culture (later clarified that it was from fluid that was weeping from wound) that showed MRSA that was susceptible to Ciprofloxacin, Tetracycline and Vancomycin. Faxed results to office (results placed in provider box). NP wanted to speak with Dr. Tommy Medal about antibiotics and next steps for patient. RN spoke with provider who stated the patient did not return to office for follow up visit and would not stop antibiotics as he requested to ensure that possible surgical cultures were accurate.   RN contacted Jimmye Norman again and explained this to her; she stated she has started the patient on Cipro and Doxycycline. RN stated that the patient is welcome to follow up with our office as instructed. Also informed NP that the patient was seen by ortho and podiatry who don't believe she's a good surgical candidate (high risk candidate) which complicates obtaining cultures as tissue cultures are needed to determine antibiotic treatment. NP verbalized understanding and stated she would contact the patient and inform her to follow up with [place].   NP reiterated numerous times that the patient is related to "multimillionaire" in [place] and that telling her "no" would be difficult. RN explained that her family connections do not influence treatment plan and that the provider was making recommendations off of information in front of him. Recommendations were made to the patient and she did not appear to be compliant as she has missed follow up and did not discontinue antibiotic regimen.   Forwarding to Dr. Jacqualyn Posey with Podiatry and Dr. Tommy Medal.   Carlean Purl, RN

## 2021-02-11 NOTE — Telephone Encounter (Signed)
They would prefer bad things not to happen is what my note should read

## 2021-02-12 ENCOUNTER — Ambulatory Visit: Payer: BC Managed Care – PPO | Admitting: Infectious Diseases

## 2021-02-12 ENCOUNTER — Ambulatory Visit: Payer: BC Managed Care – PPO | Admitting: Infectious Disease

## 2021-02-16 DIAGNOSIS — F1120 Opioid dependence, uncomplicated: Secondary | ICD-10-CM | POA: Diagnosis not present

## 2021-02-16 DIAGNOSIS — Z7151 Drug abuse counseling and surveillance of drug abuser: Secondary | ICD-10-CM | POA: Diagnosis not present

## 2021-02-18 ENCOUNTER — Ambulatory Visit: Payer: BC Managed Care – PPO | Admitting: Internal Medicine

## 2021-02-18 DIAGNOSIS — Z7151 Drug abuse counseling and surveillance of drug abuser: Secondary | ICD-10-CM | POA: Diagnosis not present

## 2021-02-18 DIAGNOSIS — F1120 Opioid dependence, uncomplicated: Secondary | ICD-10-CM | POA: Diagnosis not present

## 2021-02-19 DIAGNOSIS — Z03818 Encounter for observation for suspected exposure to other biological agents ruled out: Secondary | ICD-10-CM | POA: Diagnosis not present

## 2021-02-19 DIAGNOSIS — F1120 Opioid dependence, uncomplicated: Secondary | ICD-10-CM | POA: Diagnosis not present

## 2021-02-20 ENCOUNTER — Telehealth: Payer: Self-pay

## 2021-02-20 NOTE — Telephone Encounter (Signed)
Shelley Iron, NP called with patient on the line to inquire why patient has not been followed up with.   Notified him that patient has no-showed multiple times. She states she did not "do it on purpose," but that she "has a lot going on."  RN explained to the patient that we prefer a courtesy call to let the office know if she is unable to keep an appointment.   She accepts appointment for 10/17, Ernst Breach, NP aware of appointment as well and says he will keep patient off antibiotics to allow for biopsy.  Beryle Flock, RN

## 2021-03-02 ENCOUNTER — Ambulatory Visit: Payer: BC Managed Care – PPO | Admitting: Internal Medicine

## 2021-03-02 DIAGNOSIS — Z7151 Drug abuse counseling and surveillance of drug abuser: Secondary | ICD-10-CM | POA: Diagnosis not present

## 2021-03-02 DIAGNOSIS — F1120 Opioid dependence, uncomplicated: Secondary | ICD-10-CM | POA: Diagnosis not present

## 2021-03-04 DIAGNOSIS — F1120 Opioid dependence, uncomplicated: Secondary | ICD-10-CM | POA: Diagnosis not present

## 2021-03-04 DIAGNOSIS — Z7151 Drug abuse counseling and surveillance of drug abuser: Secondary | ICD-10-CM | POA: Diagnosis not present

## 2021-03-09 DIAGNOSIS — Z7151 Drug abuse counseling and surveillance of drug abuser: Secondary | ICD-10-CM | POA: Diagnosis not present

## 2021-03-09 DIAGNOSIS — F1120 Opioid dependence, uncomplicated: Secondary | ICD-10-CM | POA: Diagnosis not present

## 2021-03-10 DIAGNOSIS — F1120 Opioid dependence, uncomplicated: Secondary | ICD-10-CM | POA: Diagnosis not present

## 2021-03-11 DIAGNOSIS — F1120 Opioid dependence, uncomplicated: Secondary | ICD-10-CM | POA: Diagnosis not present

## 2021-03-11 DIAGNOSIS — Z7151 Drug abuse counseling and surveillance of drug abuser: Secondary | ICD-10-CM | POA: Diagnosis not present

## 2021-03-16 DIAGNOSIS — Z7151 Drug abuse counseling and surveillance of drug abuser: Secondary | ICD-10-CM | POA: Diagnosis not present

## 2021-03-16 DIAGNOSIS — F1120 Opioid dependence, uncomplicated: Secondary | ICD-10-CM | POA: Diagnosis not present

## 2021-05-21 ENCOUNTER — Encounter (HOSPITAL_BASED_OUTPATIENT_CLINIC_OR_DEPARTMENT_OTHER): Payer: BC Managed Care – PPO | Attending: Internal Medicine | Admitting: Internal Medicine

## 2021-06-07 ENCOUNTER — Emergency Department (HOSPITAL_BASED_OUTPATIENT_CLINIC_OR_DEPARTMENT_OTHER)
Admission: EM | Admit: 2021-06-07 | Discharge: 2021-06-07 | Discharge status: Absent without leave | Payer: BC Managed Care – PPO | Source: Home / Self Care

## 2021-06-18 IMAGING — DX DG LUMBAR SPINE COMPLETE 4+V
5 series · 5 of 5 positions shown · non-contrast
Comparison: None.

CLINICAL DATA: Bilateral leg pain.

EXAM:
LUMBAR SPINE - COMPLETE 4+ VIEW

[l-spine ap]
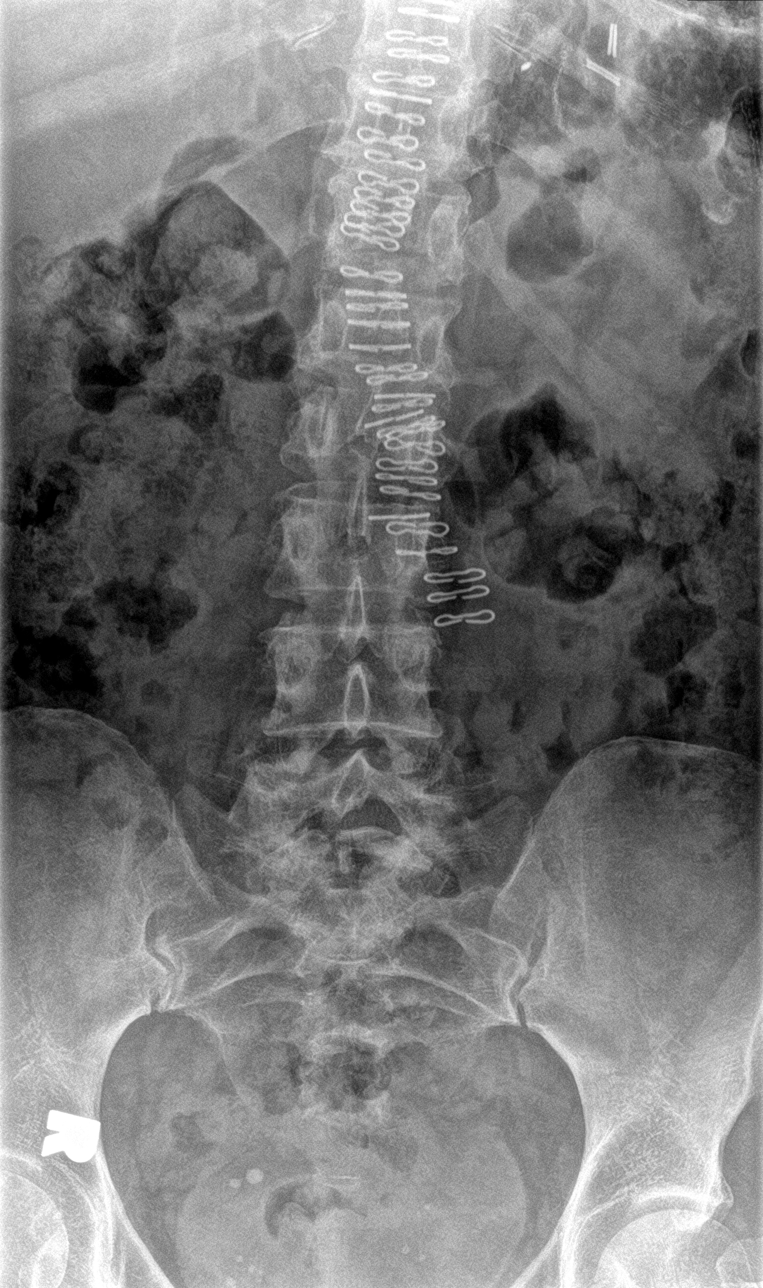

[l-spine obl (1 of 2)]
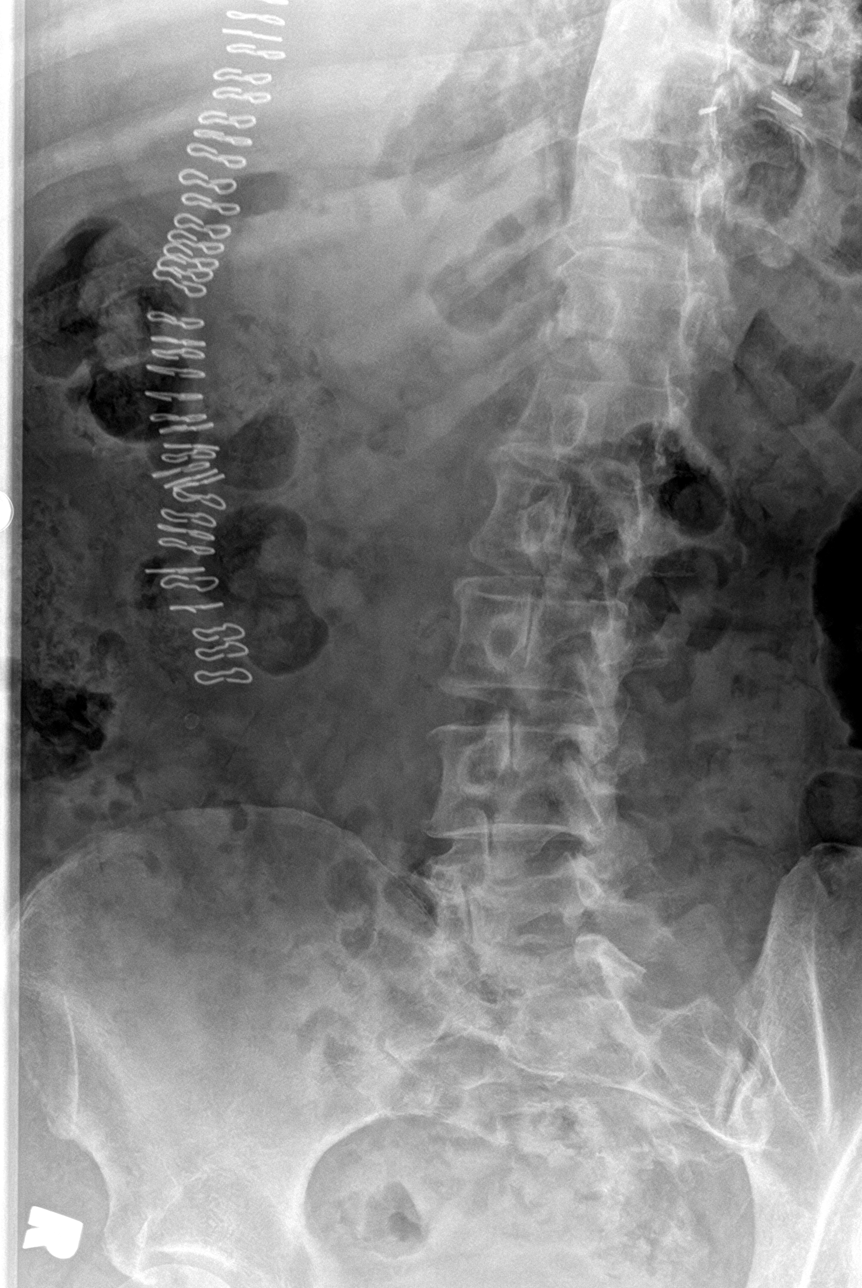

[l-spine obl (2 of 2)]
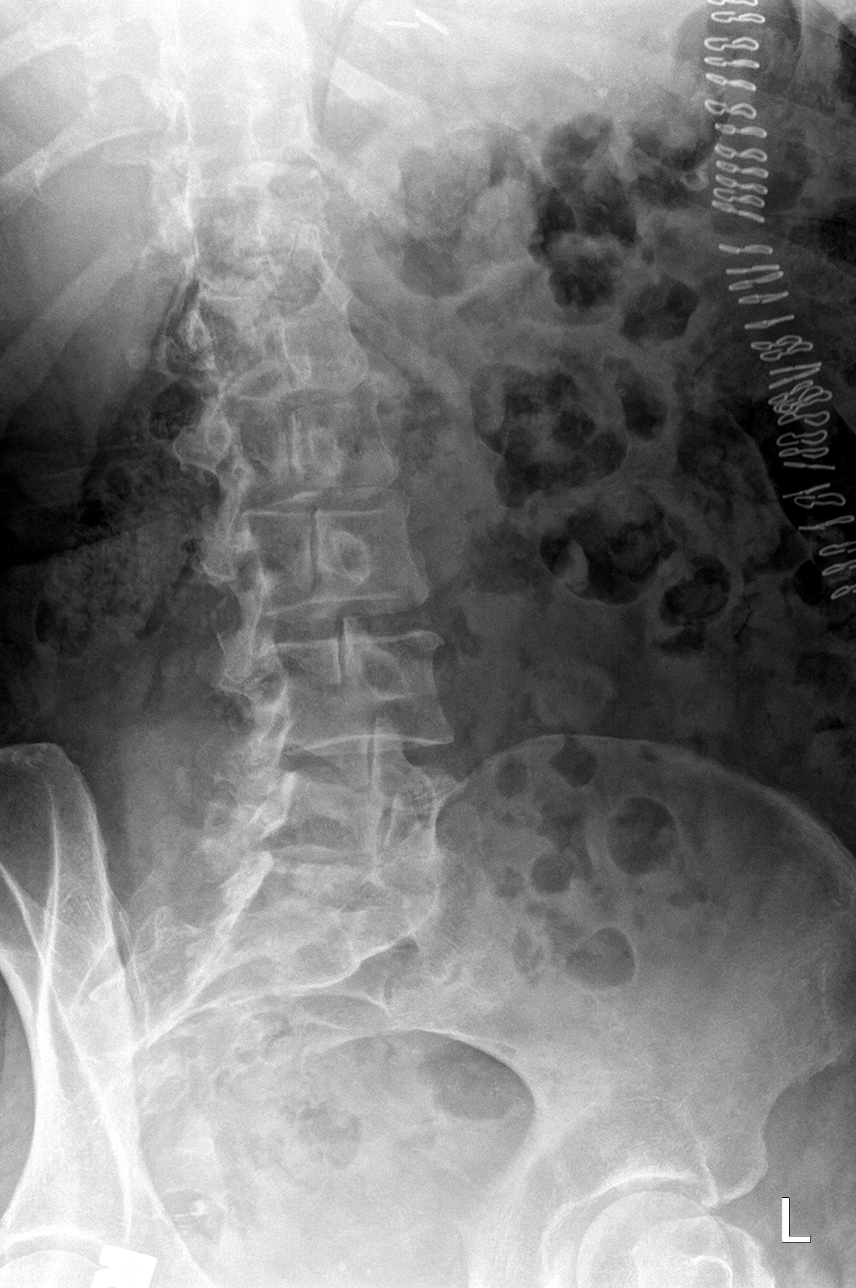

[l-spine lat]
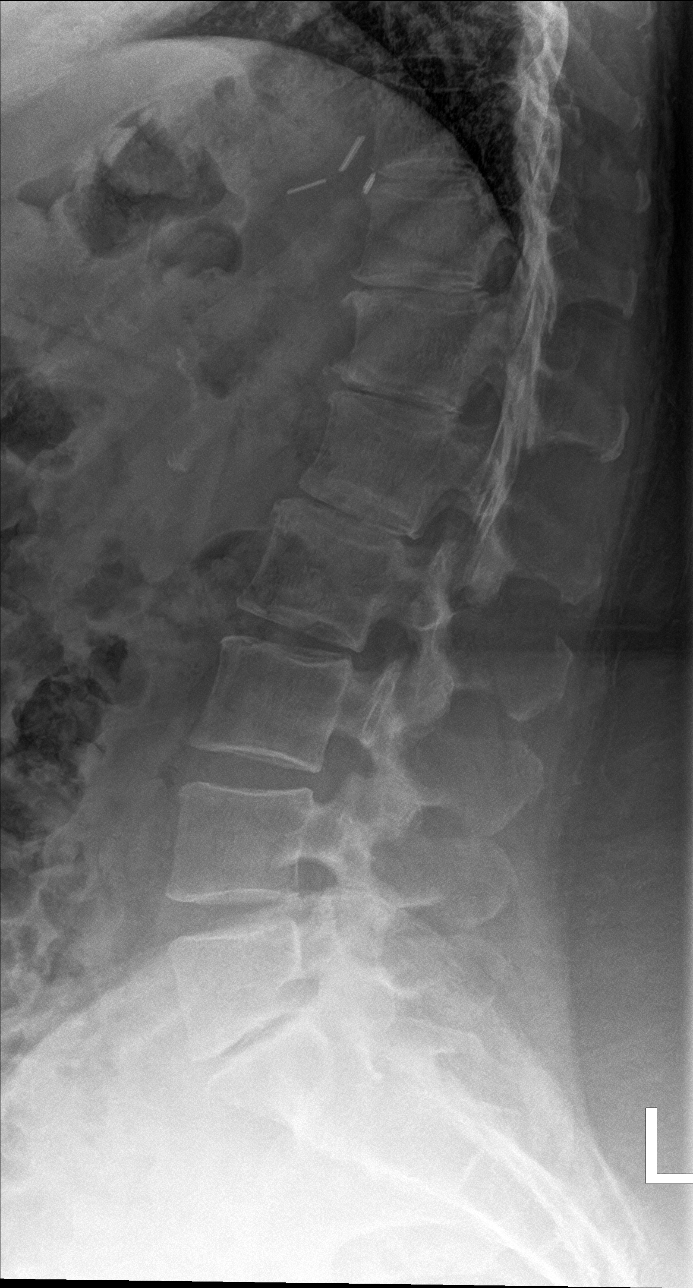

[l-spine spot]
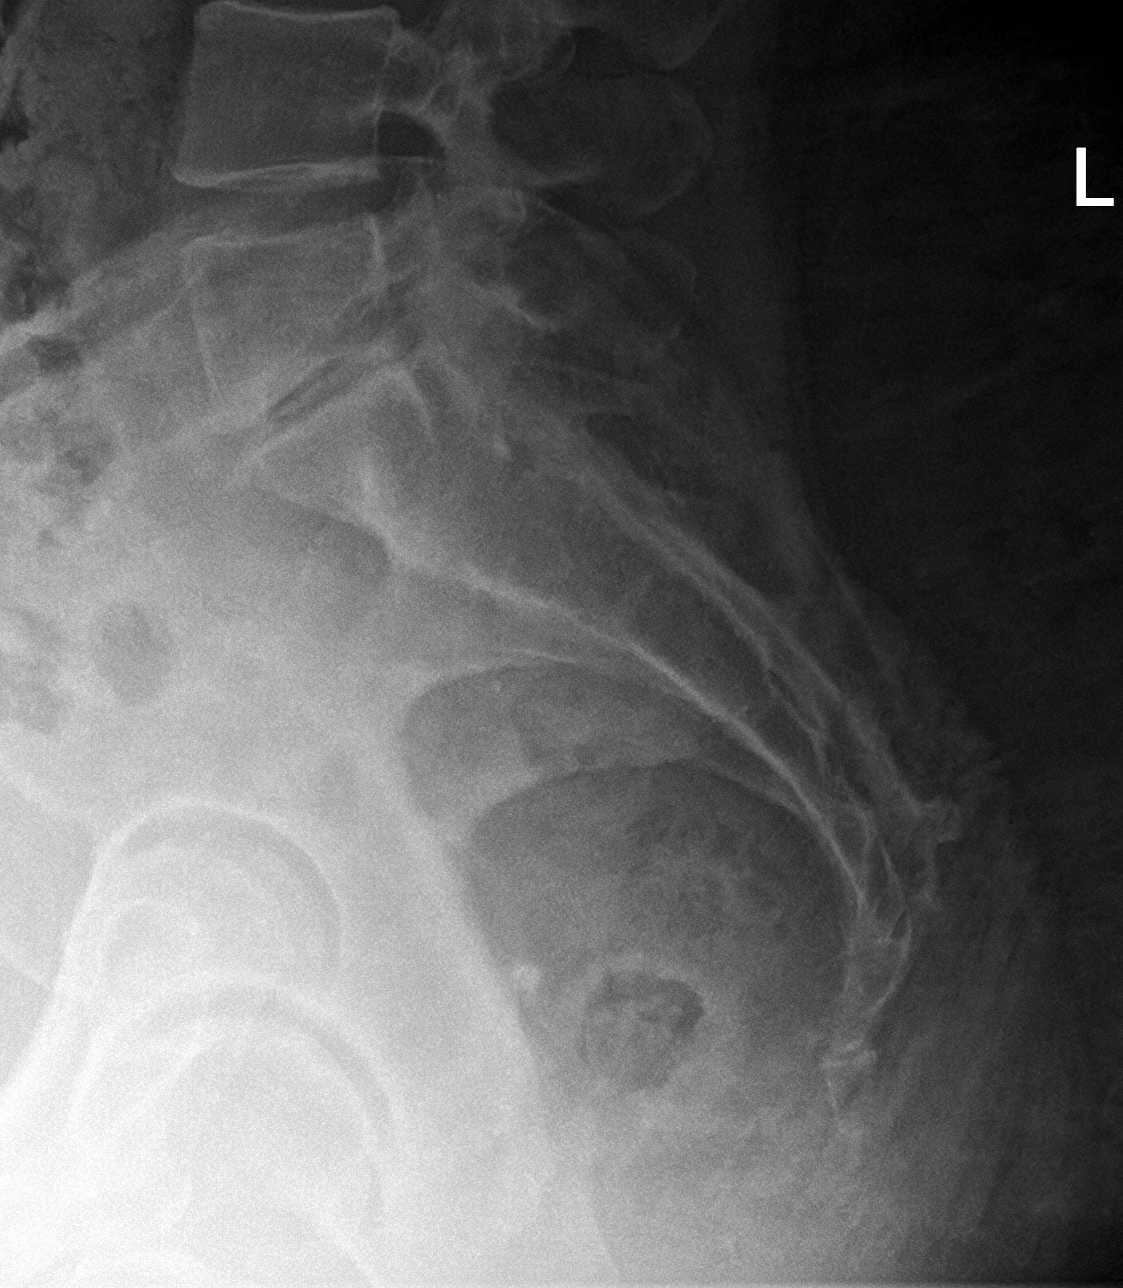

[5 of 5 positions shown; findings below may reference images not displayed]

FINDINGS: There is no evidence of lumbar spine fracture. There is 3 mm
retrolisthesis of L1 on L2. Mild-to-moderate degenerative disc and
joint disease is noted in the lumbar spine.
IMPRESSION: 1. No acute osseous abnormality.
2. Mild to moderate degenerative disc disease and joint disease.

## 2021-06-19 IMAGING — US US EXTREM LOW VENOUS*R*
1 series · 13 of 24 positions shown · non-contrast
Comparison: None.

CLINICAL DATA: Right lower extremity pain and edema

EXAM:
RIGHT LOWER EXTREMITY VENOUS DUPLEX ULTRASOUND
TECHNIQUE: Gray-scale sonography with graded compression, as well as color
Doppler and duplex ultrasound were performed to evaluate the right
lower extremity deep venous system from the level of the common
femoral vein and including the common femoral, femoral, profunda
femoral, popliteal and calf veins including the posterior tibial,
peroneal and gastrocnemius veins when visible. The superficial great
saphenous vein was also interrogated. Spectral Doppler was utilized
to evaluate flow at rest and with distal augmentation maneuvers in
the common femoral, femoral and popliteal veins.

[Series 1: us extrem low venous*right* · 13 of 35 slices shown]
[im 1/35]
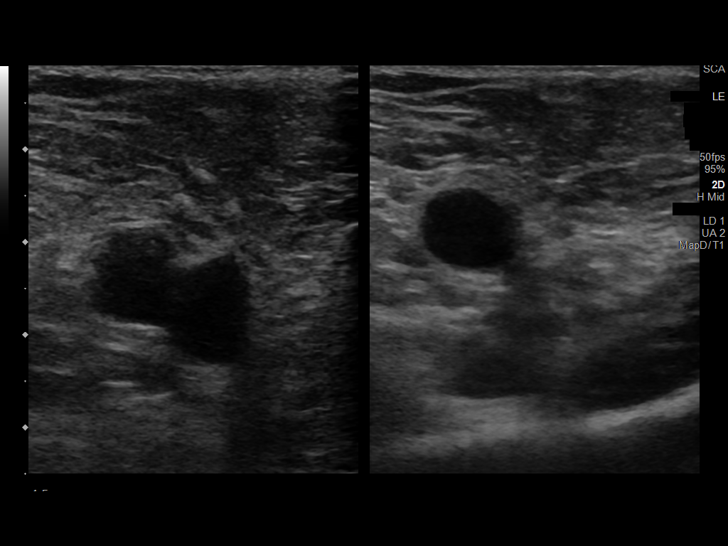
[im 3/35]
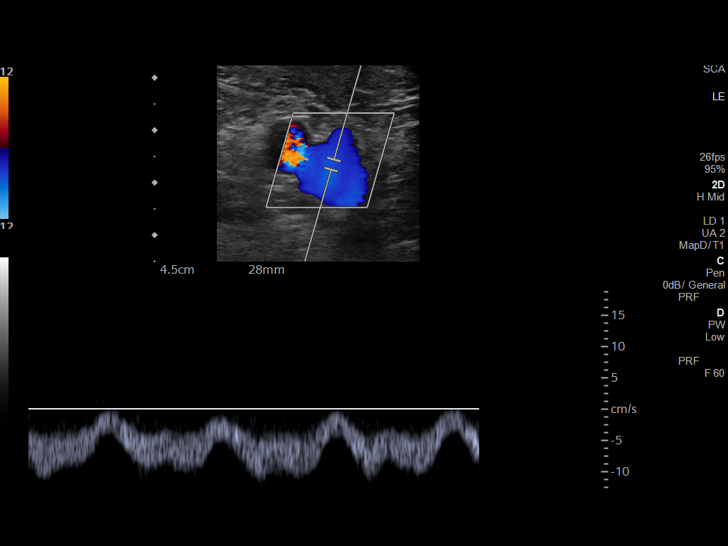
[im 6/35]
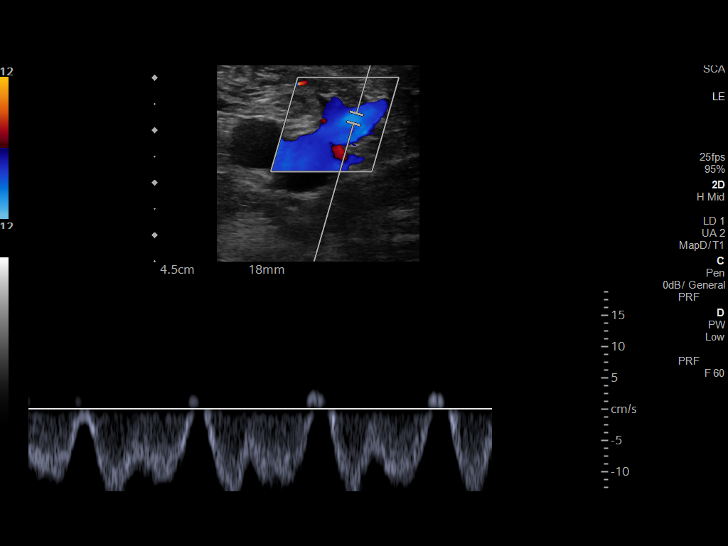
[im 9/35]
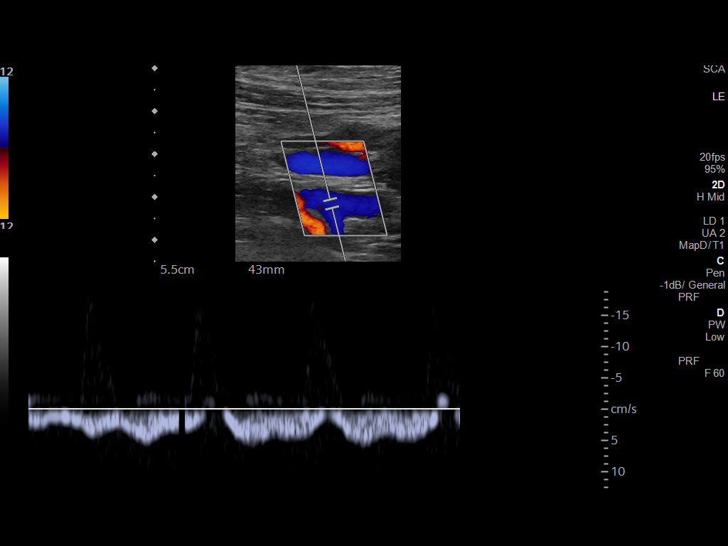
[im 12/35]
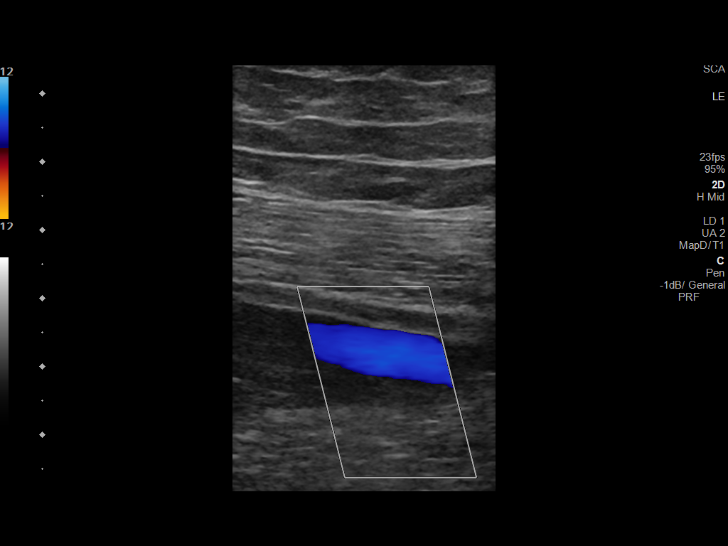
[im 15/35]
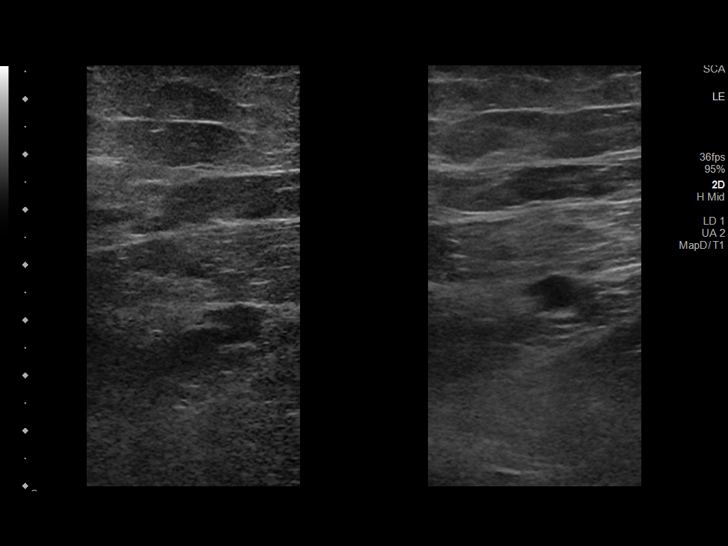
[im 18/35]
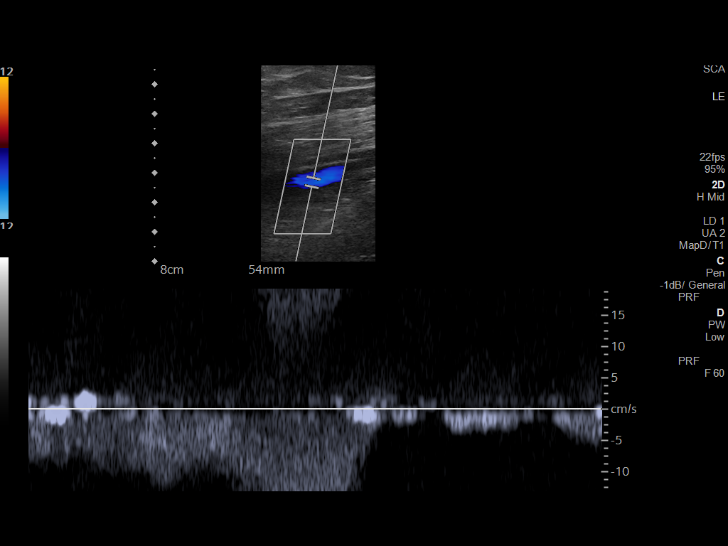
[im 20/35]
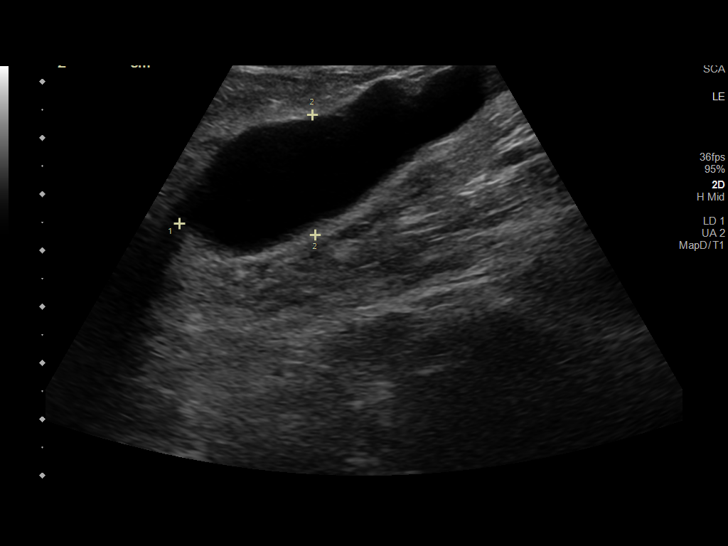
[im 23/35]
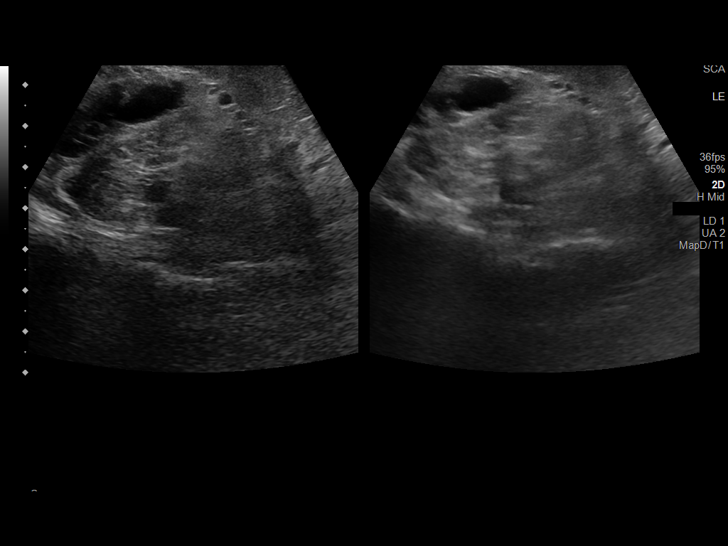
[im 26/35]
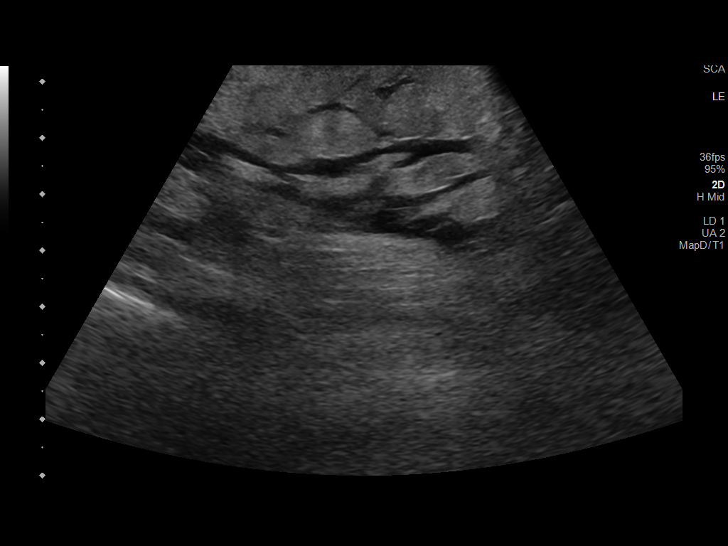
[im 29/35]
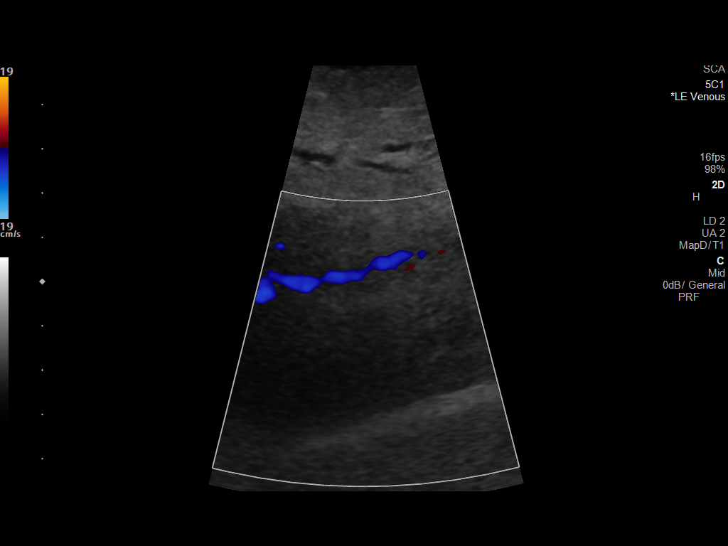
[im 32/35]
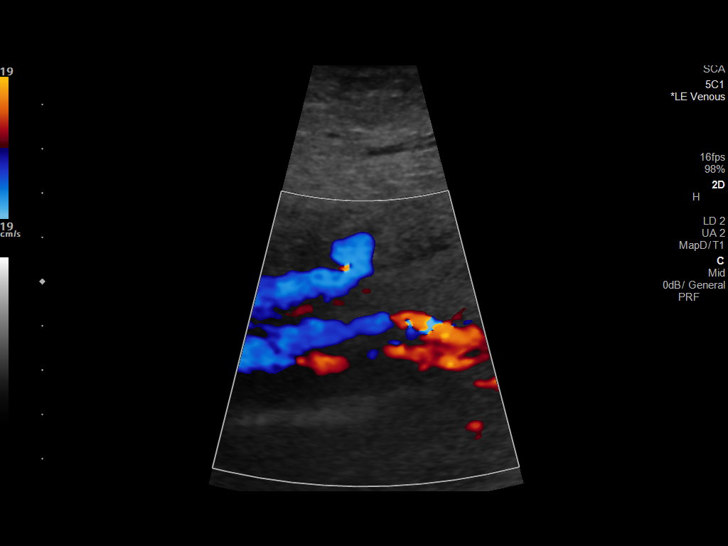
[im 35/35]
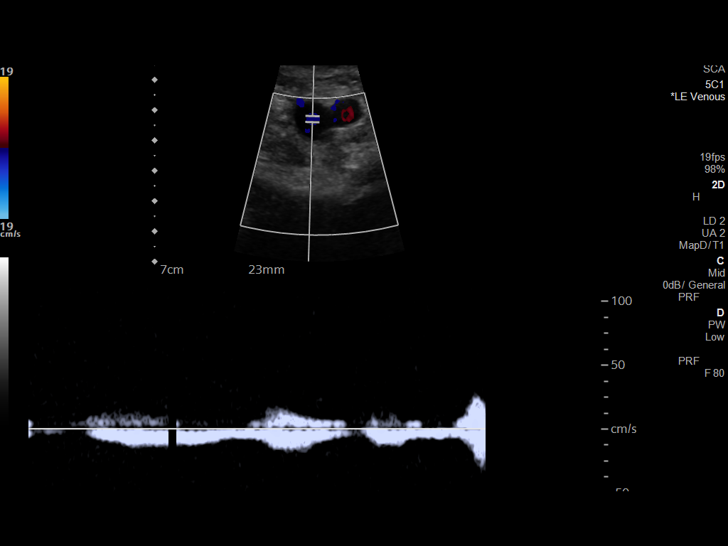

[13 of 24 positions shown; findings below may reference images not displayed]

FINDINGS: Contralateral Common Femoral Vein: Respiratory phasicity is normal
and symmetric with the symptomatic side. No evidence of thrombus.
Normal compressibility.

Common Femoral Vein: No evidence of thrombus. Normal
compressibility, respiratory phasicity and response to augmentation.

Saphenofemoral Junction: No evidence of thrombus. Normal
compressibility and flow on color Doppler imaging.

Profunda Femoral Vein: No evidence of thrombus. Normal
compressibility and flow on color Doppler imaging.

Femoral Vein: No evidence of thrombus. Normal compressibility,
respiratory phasicity and response to augmentation.

Popliteal Vein: No evidence of thrombus. Normal compressibility,
respiratory phasicity and response to augmentation.

Calf Veins: No evidence of thrombus. Normal compressibility and flow
on color Doppler imaging. Note that calf vein visualization is
somewhat limited due to soft tissue edema in the lower extremity.

Superficial Great Saphenous Vein: No evidence of thrombus. Normal
compressibility.

Venous Reflux:  None.

Other Findings: There is a fluid collection in the popliteal fossa
on the right measuring 6.2 x 2.1 x 3.1 cm.
IMPRESSION: 1. No evidence of deep venous thrombosis in the right lower
extremity. Note that visualization of the calf veins on the right is
somewhat limited due to soft tissue edema in the right lower
extremity. Left common femoral vein also patent.

2. Fluid collection in the right popliteal fossa region measuring
6.2 x 2.1 x 3.1 cm, most likely due to a Baker cyst.

## 2021-11-13 ENCOUNTER — Ambulatory Visit: Payer: Self-pay

## 2021-11-13 NOTE — Telephone Encounter (Signed)
Screw has come out and pt stated she thinks she has a "MRSA infection"- hardware coming through bottom of foot right foot- pt stated she needs to see someone right away because it looks infected.. Advised pt to go to nearest ED. Pt verbalized understanding.

## 2022-03-15 ENCOUNTER — Encounter (INDEPENDENT_AMBULATORY_CARE_PROVIDER_SITE_OTHER): Payer: Self-pay

## 2022-08-23 ENCOUNTER — Other Ambulatory Visit: Payer: Self-pay

## 2022-08-23 ENCOUNTER — Encounter (HOSPITAL_COMMUNITY): Payer: Self-pay

## 2022-08-23 ENCOUNTER — Inpatient Hospital Stay (HOSPITAL_COMMUNITY)
Admission: EM | Admit: 2022-08-23 | Discharge: 2022-08-25 | DRG: 603 | Disposition: A | Discharge status: Home / Self Care | Payer: BC Managed Care – PPO | Attending: Internal Medicine | Admitting: Internal Medicine

## 2022-08-23 DIAGNOSIS — I890 Lymphedema, not elsewhere classified: Secondary | ICD-10-CM

## 2022-08-23 DIAGNOSIS — Z96619 Presence of unspecified artificial shoulder joint: Secondary | ICD-10-CM | POA: Diagnosis present

## 2022-08-23 DIAGNOSIS — E785 Hyperlipidemia, unspecified: Secondary | ICD-10-CM | POA: Diagnosis present

## 2022-08-23 DIAGNOSIS — Z90710 Acquired absence of both cervix and uterus: Secondary | ICD-10-CM | POA: Diagnosis not present

## 2022-08-23 DIAGNOSIS — E039 Hypothyroidism, unspecified: Secondary | ICD-10-CM | POA: Diagnosis present

## 2022-08-23 DIAGNOSIS — Z83719 Family history of colon polyps, unspecified: Secondary | ICD-10-CM | POA: Diagnosis not present

## 2022-08-23 DIAGNOSIS — I872 Venous insufficiency (chronic) (peripheral): Secondary | ICD-10-CM | POA: Diagnosis present

## 2022-08-23 DIAGNOSIS — L03116 Cellulitis of left lower limb: Secondary | ICD-10-CM | POA: Diagnosis present

## 2022-08-23 DIAGNOSIS — Z9884 Bariatric surgery status: Secondary | ICD-10-CM | POA: Diagnosis not present

## 2022-08-23 DIAGNOSIS — E876 Hypokalemia: Secondary | ICD-10-CM | POA: Diagnosis not present

## 2022-08-23 DIAGNOSIS — F1120 Opioid dependence, uncomplicated: Secondary | ICD-10-CM | POA: Diagnosis present

## 2022-08-23 DIAGNOSIS — F17210 Nicotine dependence, cigarettes, uncomplicated: Secondary | ICD-10-CM | POA: Diagnosis present

## 2022-08-23 DIAGNOSIS — E871 Hypo-osmolality and hyponatremia: Secondary | ICD-10-CM | POA: Diagnosis present

## 2022-08-23 DIAGNOSIS — E861 Hypovolemia: Secondary | ICD-10-CM | POA: Diagnosis present

## 2022-08-23 DIAGNOSIS — Z8249 Family history of ischemic heart disease and other diseases of the circulatory system: Secondary | ICD-10-CM | POA: Diagnosis not present

## 2022-08-23 DIAGNOSIS — Z79899 Other long term (current) drug therapy: Secondary | ICD-10-CM

## 2022-08-23 DIAGNOSIS — K219 Gastro-esophageal reflux disease without esophagitis: Secondary | ICD-10-CM | POA: Diagnosis present

## 2022-08-23 DIAGNOSIS — Z8349 Family history of other endocrine, nutritional and metabolic diseases: Secondary | ICD-10-CM

## 2022-08-23 DIAGNOSIS — G8929 Other chronic pain: Secondary | ICD-10-CM | POA: Diagnosis present

## 2022-08-23 DIAGNOSIS — Z79890 Hormone replacement therapy (postmenopausal): Secondary | ICD-10-CM

## 2022-08-23 DIAGNOSIS — D509 Iron deficiency anemia, unspecified: Secondary | ICD-10-CM | POA: Diagnosis present

## 2022-08-23 DIAGNOSIS — M545 Low back pain, unspecified: Secondary | ICD-10-CM | POA: Diagnosis present

## 2022-08-23 DIAGNOSIS — L03119 Cellulitis of unspecified part of limb: Secondary | ICD-10-CM | POA: Diagnosis present

## 2022-08-23 DIAGNOSIS — L03115 Cellulitis of right lower limb: Secondary | ICD-10-CM

## 2022-08-23 DIAGNOSIS — S81802A Unspecified open wound, left lower leg, initial encounter: Secondary | ICD-10-CM | POA: Diagnosis present

## 2022-08-23 DIAGNOSIS — M797 Fibromyalgia: Secondary | ICD-10-CM | POA: Diagnosis present

## 2022-08-23 DIAGNOSIS — F319 Bipolar disorder, unspecified: Secondary | ICD-10-CM | POA: Diagnosis present

## 2022-08-23 DIAGNOSIS — M069 Rheumatoid arthritis, unspecified: Secondary | ICD-10-CM | POA: Diagnosis present

## 2022-08-23 DIAGNOSIS — L0390 Cellulitis, unspecified: Secondary | ICD-10-CM | POA: Diagnosis not present

## 2022-08-23 DIAGNOSIS — Z888 Allergy status to other drugs, medicaments and biological substances status: Secondary | ICD-10-CM

## 2022-08-23 LAB — CBC WITH DIFFERENTIAL/PLATELET
Abs Immature Granulocytes: 0.04 10*3/uL (ref 0.00–0.07)
Basophils Absolute: 0.1 10*3/uL (ref 0.0–0.1)
Basophils Relative: 1 %
Eosinophils Absolute: 0 10*3/uL (ref 0.0–0.5)
Eosinophils Relative: 0 %
HCT: 31.7 % — ABNORMAL LOW (ref 36.0–46.0)
Hemoglobin: 10.6 g/dL — ABNORMAL LOW (ref 12.0–15.0)
Immature Granulocytes: 0 %
Lymphocytes Relative: 15 %
Lymphs Abs: 1.5 10*3/uL (ref 0.7–4.0)
MCH: 29.9 pg (ref 26.0–34.0)
MCHC: 33.4 g/dL (ref 30.0–36.0)
MCV: 89.3 fL (ref 80.0–100.0)
Monocytes Absolute: 1.1 10*3/uL — ABNORMAL HIGH (ref 0.1–1.0)
Monocytes Relative: 11 %
Neutro Abs: 7.2 10*3/uL (ref 1.7–7.7)
Neutrophils Relative %: 73 %
Platelets: 288 10*3/uL (ref 150–400)
RBC: 3.55 MIL/uL — ABNORMAL LOW (ref 3.87–5.11)
RDW: 15.8 % — ABNORMAL HIGH (ref 11.5–15.5)
WBC: 10 10*3/uL (ref 4.0–10.5)
nRBC: 0 % (ref 0.0–0.2)

## 2022-08-23 LAB — BASIC METABOLIC PANEL
Anion gap: 12 (ref 5–15)
BUN: 7 mg/dL (ref 6–20)
CO2: 23 mmol/L (ref 22–32)
Calcium: 8.6 mg/dL — ABNORMAL LOW (ref 8.9–10.3)
Chloride: 97 mmol/L — ABNORMAL LOW (ref 98–111)
Creatinine, Ser: 0.83 mg/dL (ref 0.44–1.00)
GFR, Estimated: >60 mL/min (ref >60)
Glucose, Bld: 82 mg/dL (ref 70–99)
Potassium: 3.6 mmol/L (ref 3.5–5.1)
Sodium: 132 mmol/L — ABNORMAL LOW (ref 135–145)

## 2022-08-23 LAB — LACTIC ACID, PLASMA
Lactic Acid, Venous: 1.1 mmol/L (ref 0.5–1.9)
Lactic Acid, Venous: 0.8 mmol/L (ref 0.5–1.9)

## 2022-08-23 MED ORDER — VANCOMYCIN HCL 1250 MG/250ML IV SOLN
1250.0000 mg | Freq: Two times a day (BID) | INTRAVENOUS | Status: DC
  Filled 2022-08-23: qty 250

## 2022-08-23 MED ORDER — SODIUM CHLORIDE 0.9 % IV SOLN: INTRAVENOUS | Status: AC

## 2022-08-23 MED ORDER — LEVOTHYROXINE SODIUM 150 MCG PO TABS
150.0000 ug | ORAL_TABLET | Freq: Every day | ORAL | Status: DC
  Administered 2022-08-24 – 2022-08-25 (×2): 150 ug via ORAL
  Filled 2022-08-23 (×2): qty 1

## 2022-08-23 MED ORDER — BUPRENORPHINE HCL-NALOXONE HCL 8-2 MG SL SUBL
1.0000 | SUBLINGUAL_TABLET | Freq: Three times a day (TID) | SUBLINGUAL | Status: DC
  Administered 2022-08-23 – 2022-08-24 (×4): 1 via SUBLINGUAL
  Filled 2022-08-23 (×6): qty 1

## 2022-08-23 MED ORDER — KETOROLAC TROMETHAMINE 15 MG/ML IJ SOLN
15.0000 mg | Freq: Four times a day (QID) | INTRAMUSCULAR | Status: DC | PRN
  Administered 2022-08-23 – 2022-08-25 (×6): 15 mg via INTRAVENOUS
  Filled 2022-08-23 (×7): qty 1

## 2022-08-23 MED ORDER — ACETAMINOPHEN 325 MG PO TABS: 650.0000 mg | ORAL_TABLET | Freq: Four times a day (QID) | ORAL | Status: DC | PRN

## 2022-08-23 MED ORDER — LEVOTHYROXINE SODIUM 25 MCG PO TABS: 137.0000 ug | ORAL_TABLET | Freq: Every day | ORAL | Status: DC

## 2022-08-23 MED ORDER — ARIPIPRAZOLE 5 MG PO TABS: 5.0000 mg | ORAL_TABLET | Freq: Every day | ORAL | Status: DC

## 2022-08-23 MED ORDER — BUPRENORPHINE HCL-NALOXONE HCL 2-0.5 MG SL SUBL
1.0000 | SUBLINGUAL_TABLET | Freq: Every day | SUBLINGUAL | Status: DC
  Filled 2022-08-23: qty 1

## 2022-08-23 MED ORDER — ALPRAZOLAM 0.5 MG PO TABS: 1.0000 mg | ORAL_TABLET | Freq: Two times a day (BID) | ORAL | Status: DC

## 2022-08-23 MED ORDER — NICOTINE 21 MG/24HR TD PT24
21.0000 mg | MEDICATED_PATCH | Freq: Once | TRANSDERMAL | Status: AC
  Administered 2022-08-23: 21 mg via TRANSDERMAL
  Filled 2022-08-23: qty 1

## 2022-08-23 MED ORDER — VANCOMYCIN HCL IN DEXTROSE 1-5 GM/200ML-% IV SOLN
1000.0000 mg | Freq: Once | INTRAVENOUS | Status: AC
  Administered 2022-08-23: 1000 mg via INTRAVENOUS
  Filled 2022-08-23: qty 200

## 2022-08-23 MED ORDER — LACTATED RINGERS IV SOLN: INTRAVENOUS | Status: DC

## 2022-08-23 MED ORDER — BUMETANIDE 1 MG PO TABS: 1.0000 mg | ORAL_TABLET | Freq: Every day | ORAL | Status: DC

## 2022-08-23 MED ORDER — HYDROXYCHLOROQUINE SULFATE 200 MG PO TABS
200.0000 mg | ORAL_TABLET | Freq: Two times a day (BID) | ORAL | Status: DC
  Administered 2022-08-23 – 2022-08-25 (×4): 200 mg via ORAL
  Filled 2022-08-23 (×4): qty 1

## 2022-08-23 MED ORDER — SODIUM CHLORIDE 0.9 % IV SOLN
1.0000 g | INTRAVENOUS | Status: DC
  Administered 2022-08-23 – 2022-08-24 (×2): 1 g via INTRAVENOUS
  Filled 2022-08-23 (×3): qty 10

## 2022-08-23 MED ORDER — DULOXETINE HCL 60 MG PO CPEP
60.0000 mg | ORAL_CAPSULE | Freq: Every day | ORAL | Status: DC
  Administered 2022-08-24 – 2022-08-25 (×2): 60 mg via ORAL
  Filled 2022-08-23: qty 1
  Filled 2022-08-23: qty 2
  Filled 2022-08-23: qty 1

## 2022-08-23 MED ORDER — ONDANSETRON HCL 4 MG/2ML IJ SOLN: 4.0000 mg | Freq: Four times a day (QID) | INTRAMUSCULAR | Status: DC | PRN

## 2022-08-23 MED ORDER — TRAZODONE HCL 100 MG PO TABS
100.0000 mg | ORAL_TABLET | Freq: Every day | ORAL | Status: DC
  Filled 2022-08-23 (×2): qty 1

## 2022-08-23 MED ORDER — ENOXAPARIN SODIUM 40 MG/0.4ML IJ SOSY: 40.0000 mg | PREFILLED_SYRINGE | INTRAMUSCULAR | Status: DC

## 2022-08-23 MED ORDER — PREGABALIN 75 MG PO CAPS
200.0000 mg | ORAL_CAPSULE | Freq: Two times a day (BID) | ORAL | Status: DC
  Administered 2022-08-23 – 2022-08-25 (×4): 200 mg via ORAL
  Filled 2022-08-23 (×4): qty 1

## 2022-08-23 MED ORDER — VANCOMYCIN HCL 1250 MG/250ML IV SOLN
1250.0000 mg | Freq: Every day | INTRAVENOUS | Status: DC
  Administered 2022-08-23 – 2022-08-24 (×2): 1250 mg via INTRAVENOUS
  Filled 2022-08-23 (×2): qty 250

## 2022-08-23 MED ORDER — ACETAMINOPHEN 650 MG RE SUPP: 650.0000 mg | Freq: Four times a day (QID) | RECTAL | Status: DC | PRN

## 2022-08-23 MED ORDER — ONDANSETRON HCL 4 MG PO TABS: 4.0000 mg | ORAL_TABLET | Freq: Four times a day (QID) | ORAL | Status: DC | PRN

## 2022-08-23 MED ORDER — MORPHINE SULFATE (PF) 4 MG/ML IV SOLN
6.0000 mg | Freq: Once | INTRAVENOUS | Status: AC
  Administered 2022-08-23: 6 mg via INTRAVENOUS
  Filled 2022-08-23: qty 2

## 2022-08-23 MED ORDER — POLYETHYLENE GLYCOL 3350 17 G PO PACK: 17.0000 g | PACK | Freq: Every day | ORAL | Status: DC | PRN

## 2022-08-23 NOTE — Plan of Care (Signed)

## 2022-08-23 NOTE — Progress Notes (Signed)
Patient refused dose of suboxone 2-0.5 mg tablet in stericycle.  Witnessed by Cecille Amsterdam, RN.  MD made aware that patient's home does did not match ordered dose.  Pharmacy aware as well.  MD changed ordered dose to match home dose.  Patient made aware.    Levora Angel, RN

## 2022-08-23 NOTE — ED Notes (Signed)
ED TO INPATIENT HANDOFF REPORT  Name/Age/Gender [name] 62 y.o. female  Code Status Code Status History     Date Active Date Inactive Code Status Order ID Comments User Context   05/12/2019 0046 05/12/2019 1002 Full Code [id number]  Omar Person, MD ED   04/23/2014 1413 04/24/2014 1430 Full Code [id number]  Kathleene Hazel, MD Inpatient       Home/SNF/Other Home  Chief Complaint Lower extremity cellulitis [L03.119]  Level of Care/Admitting Diagnosis ED Disposition     ED Disposition  Admit   Condition  --   Comment  Hospital Area: [facility] [[id number]]  Level of Care: Med-Surg [16]  May admit patient to [facility] or [facility] if equivalent level of care is available:: No  Covid Evaluation: Asymptomatic - no recent exposure (last 10 days) testing not required  Diagnosis: Lower extremity cellulitis [250539]  Admitting Physician: Marinda Elk [[id number]]  Attending Physician: Marinda Elk [[id number]]  Certification:: I certify this patient will need inpatient services for at least 2 midnights  Estimated Length of Stay: 2          Medical History Past Medical History:  Diagnosis Date   Acute pericarditis    a. 04/2014 Cath: nl cors, EF 65-70%;  b. 04/2014 Echo: EF 65-70%, mild TR.   Allergy    Anal fissure    Anemia    Anxiety    Bipolar 1 disorder    Chronic lower back pain    Chronic pain    Exposed orthopaedic hardware 01/02/2021   Fibromyalgia    GERD (gastroesophageal reflux disease)    HLD (hyperlipidemia)    Hypothyroidism    MRSA carrier 01/02/2021   RA (rheumatoid arthritis)    "all over"   Substance abuse    Wrist fracture    right    Allergies Allergies  Allergen Reactions   Amitriptyline Palpitations, Nausea And Vomiting and Swelling    Increases heart rate  Increases heart rate  Increases heart rate  Increases heart rate  Increases heart rate  Increases heart rate, Increases heart rate,  Increases heart rate, Increases heart rate   Methotrexate Other (See Comments) and Nausea And Vomiting    IV Location/Drains/Wounds Patient Lines/Drains/Airways Status     Active Line/Drains/Airways     Name Placement date Placement time Site Days   Peripheral IV 08/23/22 20 G 1" Left Antecubital 08/23/22  1233  Antecubital  less than 1   Peripheral IV 08/23/22 20 G 1" Anterior;Right Forearm 08/23/22  1234  Forearm  less than 1   Incision (Closed) 10/31/17 Arm Right 10/31/17  1556  -- 1757            Labs/Imaging Results for orders placed or performed during the hospital encounter of 08/23/22 (from the past 48 hour(s))  CBC with Differential/Platelet     Status: Abnormal   Collection Time: 08/23/22 12:43 PM  Result Value Ref Range   WBC 10.0 4.0 - 10.5 K/uL   RBC 3.55 (L) 3.87 - 5.11 MIL/uL   Hemoglobin 10.6 (L) 12.0 - 15.0 g/dL   HCT 76.7 (L) 34.1 - 93.7 %   MCV 89.3 80.0 - 100.0 fL   MCH 29.9 26.0 - 34.0 pg   MCHC 33.4 30.0 - 36.0 g/dL   RDW 90.2 (H) 40.9 - 73.5 %   Platelets 288 150 - 400 K/uL   nRBC 0.0 0.0 - 0.2 %   Neutrophils Relative % 73 %   Neutro  Abs 7.2 1.7 - 7.7 K/uL   Lymphocytes Relative 15 %   Lymphs Abs 1.5 0.7 - 4.0 K/uL   Monocytes Relative 11 %   Monocytes Absolute 1.1 (H) 0.1 - 1.0 K/uL   Eosinophils Relative 0 %   Eosinophils Absolute 0.0 0.0 - 0.5 K/uL   Basophils Relative 1 %   Basophils Absolute 0.1 0.0 - 0.1 K/uL   Immature Granulocytes 0 %   Abs Immature Granulocytes 0.04 0.00 - 0.07 K/uL    Comment: Performed at [facility], [address], [place], [place] [address]  Basic metabolic panel     Status: Abnormal   Collection Time: 08/23/22 12:43 PM  Result Value Ref Range   Sodium 132 (L) 135 - 145 mmol/L   Potassium 3.6 3.5 - 5.1 mmol/L   Chloride 97 (L) 98 - 111 mmol/L   CO2 23 22 - 32 mmol/L   Glucose, Bld 82 70 - 99 mg/dL    Comment: Glucose reference range applies only to samples taken after fasting for at  least 8 hours.   BUN 7 6 - 20 mg/dL   Creatinine, Ser 4.27 0.44 - 1.00 mg/dL   Calcium 8.6 (L) 8.9 - 10.3 mg/dL   GFR, Estimated >06 >23 mL/min    Comment: (NOTE) Calculated using the CKD-EPI Creatinine Equation (2021)    Anion gap 12 5 - 15    Comment: Performed at [facility], [address] [place], [place] [address]  Lactic acid, plasma     Status: None   Collection Time: 08/23/22 12:43 PM  Result Value Ref Range   Lactic Acid, Venous 1.1 0.5 - 1.9 mmol/L    Comment: Performed at [facility], [address] [place], [place] [address]  Lactic acid, plasma     Status: None   Collection Time: 08/23/22  2:00 PM  Result Value Ref Range   Lactic Acid, Venous 0.8 0.5 - 1.9 mmol/L    Comment: Performed at [facility], [address], [place], [place] [address]   No results found.  Pending Labs Unresulted Labs (From admission, onward)     Start     Ordered   08/23/22 1203  Culture, blood (Routine X 2) w Reflex to ID Panel  BLOOD CULTURE X 2,   R (with STAT occurrences)     Question:  Patient immune status  Answer:  Normal   08/23/22 1202            Vitals/Pain Today's Vitals   08/23/22 1200 08/23/22 1345 08/23/22 1349 08/23/22 1445  BP: 101/70   106/69  Pulse: 100 83  88  Resp:    18  Temp:      TempSrc:      SpO2: 95% 96%  95%  Weight:      PainSc:   4      Isolation Precautions No active isolations  Medications Medications  lactated ringers infusion ( Intravenous New Bag/Given 08/23/22 1238)  nicotine (NICODERM CQ - dosed in mg/24 hours) patch 21 mg (21 mg Transdermal Patch Applied 08/23/22 1357)  vancomycin (VANCOCIN) IVPB 1000 mg/200 mL premix (0 mg Intravenous Stopped 08/23/22 1403)  morphine (PF) 4 MG/ML injection 6 mg (6 mg Intravenous Given 08/23/22 1239)    Mobility walks with device

## 2022-08-23 NOTE — ED Notes (Signed)
Assumed care of this pt. Pt CAOx4. Equal chest rise and fall noted. Pt is resting in bed.

## 2022-08-23 NOTE — ED Triage Notes (Signed)
C/o right lower leg swelling with redness and heat x2 days.  Pt reports IV abx in ER on 4/6 and sent home on PO abx w/o relief.

## 2022-08-23 NOTE — Progress Notes (Signed)
Pharmacy Antibiotic Note  [name] is a 62 y.o. female admitted on 08/23/2022 with cellulitis.  Pharmacy has been consulted for vancomycin dosing.  Plan: Vancomycin 1000 mg IV now, then 1250 mg IV q24 hr (est AUC 488 based on SCr 0.83; Vd 0.5) Measure vancomycin AUC at steady state as indicated SCr q48 while on vanc MRSA PCR ordered; f/u and narrow vanc as appropriate   Weight: 94 kg (207 lb 3.7 oz)  Temp (24hrs), Avg:98.5 F (36.9 C), Min:98.4 F (36.9 C), Max:98.5 F (36.9 C)  Recent Labs  Lab 08/23/22 1243 08/23/22 1400  WBC 10.0  --   CREATININE 0.83  --   LATICACIDVEN 1.1 0.8    CrCl cannot be calculated (Unknown ideal weight.).    Allergies  Allergen Reactions   Amitriptyline Palpitations, Nausea And Vomiting and Swelling    Increases heart rate  Increases heart rate  Increases heart rate  Increases heart rate  Increases heart rate  Increases heart rate, Increases heart rate, Increases heart rate, Increases heart rate   Methotrexate Other (See Comments) and Nausea And Vomiting    Antimicrobials this admission: 4/8 vancomycin >>  4/8 Rocephin >>   Dose adjustments this admission: N/a  Microbiology results: 4/8 BCx: sent   Thank you for allowing pharmacy to be a part of this patient's care.  Verl Kitson A 08/23/2022 7:19 PM

## 2022-08-23 NOTE — H&P (Signed)
History and Physical  Shelley AlineLynn [place] ZOX:[id number] DOB: 11/19/1960 DOA: 08/23/2022  PCP: Pcp, No Patient coming from: Home  I have personally briefly reviewed patient's old medical records in [facility]   Chief Complaint: Right lower extremity cellulitis and left lower extremity wound  HPI: [name] is a 62 y.o. female past medical history of fibromyalgia, [facility] extremity cellulitis comes in for no improvement in her right lower extremity cellulitis but open wound on the left on the day of admission after trauma, she relate she has been taking her oral antibiotics with no improvement.  In the ED: Found to be afebrile with a white count of 10 started on IV Vanco and Rocephin blood cultures have been ordered.   Review of Systems: All systems reviewed and apart from history of presenting illness, are negative.  Past Medical History:  Diagnosis Date   Acute pericarditis    a. 04/2014 Cath: nl cors, EF 65-70%;  b. 04/2014 Echo: EF 65-70%, mild TR.   Allergy    Anal fissure    Anemia    Anxiety    Bipolar 1 disorder    Chronic lower back pain    Chronic pain    Exposed orthopaedic hardware 01/02/2021   Fibromyalgia    GERD (gastroesophageal reflux disease)    HLD (hyperlipidemia)    Hypothyroidism    MRSA carrier 01/02/2021   RA (rheumatoid arthritis)    "all over"   Substance abuse    Wrist fracture    right   Past Surgical History:  Procedure Laterality Date   ABDOMINAL HYSTERECTOMY  03/2004   BACK SURGERY     CESAREAN SECTION  1993; 1999   COLONOSCOPY     FOOT SURGERY Right 01/10/2016   GASTRIC BYPASS  2001   LEFT HEART CATHETERIZATION WITH CORONARY ANGIOGRAM N/A 04/23/2014   Normal coronaries and LVF   OPEN REDUCTION INTERNAL FIXATION (ORIF) DISTAL RADIAL FRACTURE Right 10/31/2017   Procedure: OPEN REDUCTION INTERNAL FIXATION (ORIF) DISTAL RADIAL FRACTURE;  Surgeon: Bjorn PippinVarkey, Dax T, MD;  Location: [facility] OR;  Service:  Orthopedics;  Laterality: Right;   SHOULDER ARTHROSCOPY W/ ROTATOR CUFF REPAIR Right ~ 2011   TARSAL METATARSAL FUSION WITH WEIL OSTEOTOMY Left 2013   TONSILLECTOMY     TOTAL SHOULDER ARTHROPLASTY     Social History:  reports that she has been smoking cigarettes. She has a 34.00 pack-year smoking history. She has never used smokeless tobacco. She reports that she does not currently use drugs after having used the following drugs: Cocaine. She reports that she does not drink alcohol.   Allergies  Allergen Reactions   Amitriptyline Palpitations, Nausea And Vomiting and Swelling    Increases heart rate  Increases heart rate  Increases heart rate  Increases heart rate  Increases heart rate  Increases heart rate, Increases heart rate, Increases heart rate, Increases heart rate   Methotrexate Other (See Comments) and Nausea And Vomiting    Family History  Problem Relation Age of Onset   Thyroid disease Mother    Colon polyps Father    Colon polyps Sister    Heart attack Maternal Grandfather    Colon polyps Paternal Grandmother    Colon cancer Neg Hx     Prior to Admission medications   Medication Sig Start Date End Date Taking? Authorizing Provider  albuterol (VENTOLIN HFA) 108 (90 Base) MCG/ACT inhaler Inhale 2 puffs into the lungs every 4 (four) hours as needed. 02/22/19  [provider]  ALPRAZolam Prudy Feeler) 1 MG tablet Take 1 mg by mouth 2 (two) times daily. 07/23/19   [provider]  ARIPiprazole (ABILIFY) 5 MG tablet Take 5 mg by mouth daily. 09/03/19   [provider]  bumetanide (BUMEX) 1 MG tablet Take by mouth. 10/03/20   [provider]  Buprenorphine HCl-Naloxone HCl 8-2 MG FILM Place under the tongue 3 (three) times daily. 07/25/19   [provider]  clindamycin (CLEOCIN) 300 MG capsule Take 300 mg by mouth 4 (four) times daily. 12/30/20   [provider]  cyclobenzaprine (FLEXERIL) 10 MG tablet Take 10 mg by mouth 3  (three) times daily. Patient not taking: Reported on 01/02/2021 11/27/20   [provider]  diclofenac (VOLTAREN) 50 MG EC tablet Take 50 mg by mouth 2 (two) times daily as needed. Patient not taking: Reported on 01/02/2021 01/25/19   [provider]  doxycycline (MONODOX) 100 MG capsule Take by mouth. Patient not taking: Reported on 01/02/2021 10/03/20   [provider]  DULoxetine (CYMBALTA) 60 MG capsule Take 120 mg by mouth daily. 2 60mg s in am    [provider]  ergocalciferol (VITAMIN D2) 1.25 MG (50000 UT) capsule Take 1 capsule by mouth once a week. 10/06/20   [provider]  ketorolac (TORADOL) 10 MG tablet Take 10 mg by mouth 4 (four) times daily. Patient not taking: Reported on 01/02/2021 11/27/20   [provider]  levocetirizine (XYZAL) 5 MG tablet Take 5 mg by mouth daily. Patient not taking: Reported on 01/02/2021 01/26/19   [provider]  levothyroxine (SYNTHROID) 112 MCG tablet Take by mouth. Patient not taking: Reported on 01/02/2021 10/03/20   [provider]  levothyroxine (SYNTHROID) 137 MCG tablet Take by mouth daily before breakfast.     [provider]  pregabalin (LYRICA) 200 MG capsule Take 200 mg by mouth 2 (two) times daily.    [provider]  pregabalin (LYRICA) 200 MG capsule 1 capsule    [provider]  torsemide (DEMADEX) 20 MG tablet Take 1 tablet (20 mg total) by mouth 2 (two) times daily. 07/24/19 10/24/19  Yates Decamp, MD  traZODone (DESYREL) 100 MG tablet Take 100 mg by mouth at bedtime.     [provider]  VALIUM 5 MG tablet Take by mouth. Patient not taking: Reported on 01/02/2021 01/08/20   [provider]   Physical Exam: Vitals:   08/23/22 1124 08/23/22 1200 08/23/22 1345 08/23/22 1445  BP:  101/70  106/69  Pulse:  100 83 88  Resp:    18  Temp:      TempSrc:      SpO2:  95% 96% 95%  Weight: 94 kg       General exam: Moderately built and  nourished patient, lying comfortably supine on the gurney in no obvious distress. Head, eyes and ENT: Nontraumatic and normocephalic.  Neck: Supple. No JVD, carotid bruit or thyromegaly. Lymphatics: No lymphadenopathy. Respiratory system: Clear to auscultation. No increased work of breathing. Cardiovascular system: S1 and S2 heard, RRR. No JVD. Gastrointestinal system: Abdomen is nondistended, soft and nontender. Normal bowel sounds heard. No organomegaly or masses appreciated. Central nervous system: Alert and oriented. No focal neurological deficits. Extremities: Symmetric 5 x 5 power. Peripheral pulses symmetrically felt.  Skin: Right lower extremity has lymphedema changes, is erythematous and warm to touch, left lower extremity has a torn superficial skin about 4 x 4 now it is purplish color with a  small hematoma below. Musculoskeletal system: Negative exam. Psychiatry: Pleasant and cooperative.   Labs on Admission:  Basic Metabolic Panel: Recent Labs  Lab 08/23/22 1243  NA 132*  K 3.6  CL 97*  CO2 23  GLUCOSE 82  BUN 7  CREATININE 0.83  CALCIUM 8.6*   Liver Function Tests: No results for input(s): "AST", "ALT", "ALKPHOS", "BILITOT", "PROT", "ALBUMIN" in the last 168 hours. No results for input(s): "LIPASE", "AMYLASE" in the last 168 hours. No results for input(s): "AMMONIA" in the last 168 hours. CBC: Recent Labs  Lab 08/23/22 1243  WBC 10.0  NEUTROABS 7.2  HGB 10.6*  HCT 31.7*  MCV 89.3  PLT 288   Cardiac Enzymes: No results for input(s): "CKTOTAL", "CKMB", "CKMBINDEX", "TROPONINI" in the last 168 hours.  BNP (last 3 results) No results for input(s): "PROBNP" in the last 8760 hours. CBG: No results for input(s): "GLUCAP" in the last 168 hours.  Radiological Exams on Admission: No results found.  EKG: Independently reviewed.  None  Assessment/Plan Right lower extremity cellulitis Blood cultures obtained by the ED agree with IV vancomycin and  Rocephin. Try to keep her leg elevated above the heart level as she has lymphedema and the right lymphedema is worse than the left. Tylenol for fevers allow her diet she is not nauseated. She had lower extremity Doppler at [facility] which are negative. Check an ESR.  Open wound of the left lower extremity: Consult wound care there is likely due to trauma today she relates she did not hit any cabinets she just hit the rug which is hard to believe.  Fibromyalgia: Resume Cymbalta.  Chronic venous insufficiency/Lymphedema We have consulted wound care, good questions wound care will be if we can place Ace bandages on the left lower extremity as she now has an open wound.  Hypovolemic hyponatremia: Hold diuretic started on IV fluids.  Recheck basic metabolic panel in the morning  Normocytic anemia: Probably iron deficiency anemia MCV is borderline RDW is high, unlikely anemia panel to be reliable due to her infectious etiology. Will need to follow-up with PCP as an outpatient.   DVT Prophylaxis: lovenox Code Status: Full Family Communication: none  Disposition Plan: Inpatient     It is my clinical opinion that admission to INPATIENT is reasonable and necessary in this 62 y.o. female lymphedema and chronic venous insufficiency who comes in for failed outpatient cellulitis treatment  Given the aforementioned, the predictability of an adverse outcome is felt to be significant. I expect that the patient will require at least 2 midnights in the hospital to treat this condition.  Marinda Elk MD [facility]   08/23/2022, 3:31 PM

## 2022-08-23 NOTE — ED Provider Notes (Signed)
[place] EMERGENCY DEPARTMENT AT [facility] Provider Note   CSN: [id number] Arrival date & time: 08/23/22  1113     History  Chief Complaint  Patient presents with   Leg Swelling    [name] is a 62 y.o. female.  62 year old female presents with worsening pain and swelling to her right leg.  Patient seen at [facility] 2 days ago and that visit was reviewed.  It appears that patient received dose of vancomycin as well as ceftriaxone.  She also had lower extremity Dopplers of her right leg which reviewing the old record did not show any evidence of DVT.  Patient states that she has had increasing redness going up her right leg.  She has been taking oral antibiotics.  Subjective fevers noted.  No emesis noted.       Home Medications Prior to Admission medications   Medication Sig Start Date End Date Taking? Authorizing Provider  albuterol (VENTOLIN HFA) 108 (90 Base) MCG/ACT inhaler Inhale 2 puffs into the lungs every 4 (four) hours as needed. 02/22/19   [provider]  ALPRAZolam Prudy Feeler) 1 MG tablet Take 1 mg by mouth 2 (two) times daily. 07/23/19   [provider]  ARIPiprazole (ABILIFY) 5 MG tablet Take 5 mg by mouth daily. 09/03/19   [provider]  bumetanide (BUMEX) 1 MG tablet Take by mouth. 10/03/20   [provider]  Buprenorphine HCl-Naloxone HCl 8-2 MG FILM Place under the tongue 3 (three) times daily. 07/25/19   [provider]  clindamycin (CLEOCIN) 300 MG capsule Take 300 mg by mouth 4 (four) times daily. 12/30/20   [provider]  cyclobenzaprine (FLEXERIL) 10 MG tablet Take 10 mg by mouth 3 (three) times daily. Patient not taking: Reported on 01/02/2021 11/27/20   [provider]  diclofenac (VOLTAREN) 50 MG EC tablet Take 50 mg by mouth 2 (two) times daily as needed. Patient not taking: Reported on 01/02/2021 01/25/19   [provider]  doxycycline (MONODOX) 100 MG capsule Take  by mouth. Patient not taking: Reported on 01/02/2021 10/03/20   [provider]  DULoxetine (CYMBALTA) 60 MG capsule Take 120 mg by mouth daily. 2 60mg s in am    [provider]  DULoxetine (CYMBALTA) 60 MG capsule 2 capsules    [provider]  ergocalciferol (VITAMIN D2) 1.25 MG (50000 UT) capsule Take 1 capsule by mouth once a week. 10/06/20   [provider]  ketorolac (TORADOL) 10 MG tablet Take 10 mg by mouth 4 (four) times daily. Patient not taking: Reported on 01/02/2021 11/27/20   [provider]  levocetirizine (XYZAL) 5 MG tablet Take 5 mg by mouth daily. Patient not taking: Reported on 01/02/2021 01/26/19   [provider]  levothyroxine (SYNTHROID) 112 MCG tablet Take by mouth. Patient not taking: Reported on 01/02/2021 10/03/20   [provider]  levothyroxine (SYNTHROID) 137 MCG tablet Take by mouth daily before breakfast.     [provider]  pregabalin (LYRICA) 200 MG capsule Take 200 mg by mouth 2 (two) times daily.    [provider]  pregabalin (LYRICA) 200 MG capsule 1 capsule    [provider]  torsemide (DEMADEX) 20 MG tablet Take 1 tablet (20 mg total) by mouth 2 (two) times daily. 07/24/19 10/24/19  Yates Decamp, MD  traZODone (DESYREL) 100 MG tablet Take 100 mg by mouth at bedtime.     [provider]  VALIUM 5 MG tablet Take  by mouth. Patient not taking: Reported on 01/02/2021 01/08/20   [provider]      Allergies    Amitriptyline and Methotrexate    Review of Systems   Review of Systems  All other systems reviewed and are negative.   Physical Exam Updated Vital Signs BP 101/70   Pulse 100   Temp 98.4 F (36.9 C) (Oral)   Resp 17   Wt 94 kg   SpO2 95%   BMI 36.71 kg/m  Physical Exam Vitals and nursing note reviewed.  Constitutional:      General: She is not in acute distress.    Appearance: Normal appearance. She is well-developed. She is not  toxic-appearing.  HENT:     Head: Normocephalic and atraumatic.  Eyes:     General: Lids are normal.     Conjunctiva/sclera: Conjunctivae normal.     Pupils: Pupils are equal, round, and reactive to light.  Neck:     Thyroid: No thyroid mass.     Trachea: No tracheal deviation.  Cardiovascular:     Rate and Rhythm: Normal rate and regular rhythm.     Heart sounds: Normal heart sounds. No murmur heard.    No gallop.  Pulmonary:     Effort: Pulmonary effort is normal. No respiratory distress.     Breath sounds: Normal breath sounds. No stridor. No decreased breath sounds, wheezing, rhonchi or rales.  Abdominal:     General: There is no distension.     Palpations: Abdomen is soft.     Tenderness: There is no abdominal tenderness. There is no rebound.  Musculoskeletal:        General: No tenderness. Normal range of motion.     Cervical back: Normal range of motion and neck supple.       Legs:  Skin:    General: Skin is warm and dry.     Findings: No abrasion or rash.  Neurological:     Mental Status: She is alert and oriented to person, place, and time. Mental status is at baseline.     GCS: GCS eye subscore is 4. GCS verbal subscore is 5. GCS motor subscore is 6.     Cranial Nerves: No cranial nerve deficit.     Sensory: No sensory deficit.     Motor: Motor function is intact.  Psychiatric:        Attention and Perception: Attention normal.        Speech: Speech normal.        Behavior: Behavior normal.     ED Results / Procedures / Treatments   Labs (all labs ordered are listed, but only abnormal results are displayed) Labs Reviewed - No data to display  EKG None  Radiology No results found.  Procedures Procedures    Medications Ordered in ED Medications  lactated ringers infusion (has no administration in time range)  vancomycin (VANCOCIN) IVPB 1000 mg/200 mL premix (has no administration in time range)    ED Course/ Medical Decision Making/ A&P                              Medical Decision Making Amount and/or Complexity of Data Reviewed Labs: ordered.  Risk OTC drugs. Prescription drug management.   Patient here with severe cellulitis of her extremity.  Patient also has a skin tear of her left lower extremity that we will only require local wound care.  Patient has failed outpatient  treatment with oral antibiotics.  Started on vancomycin and will require admission.  Will consult hospitalist team        Final Clinical Impression(s) / ED Diagnoses Final diagnoses:  None    Rx / DC Orders ED Discharge Orders     None         Lorre Nick, MD 08/23/22 1440

## 2022-08-24 DIAGNOSIS — L03115 Cellulitis of right lower limb: Secondary | ICD-10-CM | POA: Diagnosis not present

## 2022-08-24 DIAGNOSIS — I872 Venous insufficiency (chronic) (peripheral): Secondary | ICD-10-CM | POA: Diagnosis not present

## 2022-08-24 DIAGNOSIS — S81802A Unspecified open wound, left lower leg, initial encounter: Secondary | ICD-10-CM | POA: Diagnosis not present

## 2022-08-24 DIAGNOSIS — L0390 Cellulitis, unspecified: Secondary | ICD-10-CM | POA: Diagnosis not present

## 2022-08-24 LAB — BASIC METABOLIC PANEL
Anion gap: 9 (ref 5–15)
BUN: 5 mg/dL — ABNORMAL LOW (ref 6–20)
CO2: 24 mmol/L (ref 22–32)
Calcium: 8.4 mg/dL — ABNORMAL LOW (ref 8.9–10.3)
Chloride: 102 mmol/L (ref 98–111)
Creatinine, Ser: 0.67 mg/dL (ref 0.44–1.00)
GFR, Estimated: >60 mL/min (ref >60)
Glucose, Bld: 97 mg/dL (ref 70–99)
Potassium: 3.4 mmol/L — ABNORMAL LOW (ref 3.5–5.1)
Sodium: 135 mmol/L (ref 135–145)

## 2022-08-24 LAB — CBC
HCT: 32.5 % — ABNORMAL LOW (ref 36.0–46.0)
Hemoglobin: 10.6 g/dL — ABNORMAL LOW (ref 12.0–15.0)
MCH: 29.5 pg (ref 26.0–34.0)
MCHC: 32.6 g/dL (ref 30.0–36.0)
MCV: 90.5 fL (ref 80.0–100.0)
Platelets: 302 10*3/uL (ref 150–400)
RBC: 3.59 MIL/uL — ABNORMAL LOW (ref 3.87–5.11)
RDW: 15.9 % — ABNORMAL HIGH (ref 11.5–15.5)
WBC: 7.2 10*3/uL (ref 4.0–10.5)
nRBC: 0 % (ref 0.0–0.2)

## 2022-08-24 LAB — HIV ANTIBODY (ROUTINE TESTING W REFLEX): HIV Screen 4th Generation wRfx: NONREACTIVE

## 2022-08-24 MED ORDER — MORPHINE SULFATE (PF) 4 MG/ML IV SOLN
6.0000 mg | INTRAVENOUS | Status: AC | PRN
  Administered 2022-08-24 (×2): 6 mg via INTRAVENOUS
  Filled 2022-08-24 (×3): qty 2

## 2022-08-24 MED ORDER — POTASSIUM CHLORIDE CRYS ER 20 MEQ PO TBCR
40.0000 meq | EXTENDED_RELEASE_TABLET | Freq: Two times a day (BID) | ORAL | Status: AC
  Administered 2022-08-24 (×2): 40 meq via ORAL
  Filled 2022-08-24 (×2): qty 2

## 2022-08-24 MED ORDER — NICOTINE 21 MG/24HR TD PT24
21.0000 mg | MEDICATED_PATCH | Freq: Every day | TRANSDERMAL | Status: DC
  Administered 2022-08-24 – 2022-08-25 (×2): 21 mg via TRANSDERMAL
  Filled 2022-08-24 (×2): qty 1

## 2022-08-24 NOTE — Consult Note (Addendum)
WOC Nurse Consult Note: Reason for Consult: Consult requested for bilat legs.   Left anterior calf with full thickness wound related to a skin tear which occurred prior to admission; wound is 9X7X.2cm, 80% black-dark purple skin flap adhered over wound bed, 20% red moist exposed woundbed.  Approx 2 cm area surrounding the wound edges is dark purple hematoma, slightly raised above skin level. Right leg with cellulitis; patient is currently being treated with systemic antibiotics. Generalized edema and erythremia. No open wounds of drainage, painful to touch.  Dressing procedure/placement/frequency:  Topical treatment orders provided for bedside nurses to perform as follows to promote healing:  1. Apply kerlex and ace wrap to right leg Q day, beginning just behind toes to below knee in a spiral fashion to reduce edema. 2. Apply double-folded xeroform gauze to left anterior leg Q day, then cover with ABD pad and kerlex  Discussed with patient that the skin flap to the left leg is high risk to evolve into eschar within one or two weeks. Pt has been followed by the outpatient wound care center at [facility] prior to admission for a previous wound that has healed and she plans to call them today to make an appointment for the left leg wound after discharge for further input and assessment.  Please re-consult if further assistance is needed.  Thank-you,  Cammie Mcgee MSN, RN, CWOCN, [place], CNS [phone]

## 2022-08-24 NOTE — TOC Initial Note (Signed)
Transition of Care [facility]) - Initial/Assessment Note    Patient Details  Name: [name] MRN: [id number] Date of Birth: Apr 21, 1961  Transition of Care [facility]) CM/SW Contact:    Durenda Guthrie, RN Phone Number: 08/24/2022, 8:49 AM  Clinical Narrative:                  62 yr old female recently discharged from [facility] for right lower extremity cellulitis, came to hospital because of no improvement o right leg and open wound on left leg. Receiving IV antibiotics.   Transition of Care [facility]) Department has reviewed patient and no TOC needs have been identified at this time. We will continue to monitor patient advancement through Interdisciplinary progressions and if new patient needs arise, please place a consult.      Patient Goals and CMS Choice            Expected Discharge Plan and Services                                              Prior Living Arrangements/Services                       Activities of Daily Living      Permission Sought/Granted                  Emotional Assessment              Admission diagnosis:  Lower extremity cellulitis [L03.119] Cellulitis, unspecified cellulitis site [L03.90] Non-healing wound of left lower extremity [S81.802A] Patient Active Problem List   Diagnosis Date Noted   Lower extremity cellulitis 08/23/2022   Open wound of left lower extremity 08/23/2022   Non-healing wound of left lower extremity 08/23/2022   Exposed orthopaedic hardware 01/02/2021   MRSA carrier 01/02/2021   Body mass index (BMI) 38.0-38.9, adult 12/04/2020   DDD (degenerative disc disease), cervical 12/04/2020   Primary osteoarthritis 12/04/2020   Chronic venous insufficiency 08/13/2019   Lymphedema 08/13/2019   Rheumatoid arthritis involving multiple sites with positive rheumatoid factor 05/21/2016   Primary osteoarthritis of both hands 05/21/2016   Primary osteoarthritis of both feet 05/21/2016   Primary osteoarthritis  of both knees 05/21/2016   Primary osteoarthritis of right shoulder 05/21/2016   Acute pericarditis 04/23/2014   Rheumatoid arthritis 04/11/2012   History of narcotic addiction 03/31/2012   Fibromyalgia 03/31/2012   PCP:  Pcp, No Pharmacy:   [facility] - [place], [place] - [address] [place] [address] Phone: [phone] Fax: [phone]  [id number] [place], [place] - [address] AT [facility] OF [address] [place] [address] Phone: [phone] Fax: [phone]  [facility] - [place], [place] - [address] [place] [address] [place] [address] Phone: [phone] Fax: [phone]  [facility] - [place], [place] - [address] [facility] [address] [place] [address] Phone: [phone] Fax: [phone]     Social Determinants of Health (SDOH) Social History: SDOH Screenings   Depression [phone]): Low Risk  (01/02/2021)  Tobacco Use: High Risk (08/23/2022)   SDOH Interventions:     Readmission Risk Interventions     No data to display

## 2022-08-24 NOTE — Progress Notes (Signed)
RN has continued to educate patient on her increased fall risk due to multiple sedating medications and unsteady gait. Pt refuses bed alarm at this time.

## 2022-08-24 NOTE — Progress Notes (Signed)
[facility] PROGRESS NOTE    Progress Note  [name]  GXQ:[id number] DOB: December 29, 1960 DOA: 08/23/2022 PCP: Oneita Hurt, No     Brief Narrative:   [name] is an 62 y.o. female  past medical history of fibromyalgia, recently discharged from [facility] for right lower extremity cellulitis comes in for no improvement in her right lower extremity cellulitis but open wound on the left on the day of admission after trauma, she relate she has been taking her oral antibiotics with no improvement.   Assessment/Plan:   Right lower extremity cellulitis: Started empirically on IV vancomycin and Rocephin. Has remained afebrile leukocytosis improved. Try to keep leg elevated above heart level. Blood cultures have remained negative till date. Erythema is extending proximally we have demarcated the area today. Toradol and 2 doses of IV Lasix.  Chronic venous insufficiency/ Lymphedema/Open wound of left lower extremity Wound care has been consulted, for this left lower extremity tear. Wound care has been consulted recommended Ace bandages in 1 leg and wet-to-dry dressing changes on the opposite leg. Does not appear to be infected.  Fibromyalgia: Continue Cymbalta.  Hypovolemic hyponatremia: Resolved with IV fluid resuscitation and holding diuretics, now resolved.  Hypokalemia: Replete orally recheck in the morning.  Normocytic anemia: MCV is borderline at [facility] high likely iron deficiency anemia. Anemia panel will be unreliable in the setting of infectious etiology. Will need to follow-up with PCP as an outpatient. No signs of overt bleeding.  Narcotics seeking behavior: Continue Suboxone will be cautious with narcotics.   DVT prophylaxis: lovenox Family Communication: Mother Status is: Inpatient Remains inpatient appropriate because: Left lower extremity cellulitis    Code Status:     Code Status Orders  (From admission, onward)           Start     Ordered   08/23/22 1530   Full code  Continuous       Question:  By:  Answer:  Consent: discussion documented in EHR   08/23/22 1535           Code Status History     Date Active Date Inactive Code Status Order ID Comments User Context   05/12/2019 0046 05/12/2019 1002 Full Code [id number]  Omar Person, MD ED   04/23/2014 1413 04/24/2014 1430 Full Code [id number]  Kathleene Hazel, MD Inpatient         IV Access:   Peripheral IV   Procedures and diagnostic studies:   No results found.   Medical Consultants:   None.   Subjective:    [name] relates she still in pain.  Objective:    Vitals:   08/23/22 1612 08/23/22 1929 08/24/22 0000 08/24/22 0356  BP: 121/88 114/60 111/72 100/64  Pulse: 87 78 82 81  Resp: 18 18 18 20   Temp: 98.5 F (36.9 C) 97.9 F (36.6 C) 98.2 F (36.8 C) 97.7 F (36.5 C)  TempSrc: Oral Oral Oral Oral  SpO2: 100% 99% 97% 96%  Weight:       SpO2: 96 %   Intake/Output Summary (Last 24 hours) at 08/24/2022 0827 Last data filed at 08/23/2022 2146 Gross per 24 hour  Intake 120 ml  Output --  Net 120 ml   Filed Weights   08/23/22 1124  Weight: 94 kg    Exam: General exam: In no acute distress. Respiratory system: Good air movement and clear to auscultation. Cardiovascular system: S1 & S2 heard, RRR. No JVD. Gastrointestinal system: Abdomen is nondistended, soft and nontender.  Extremities: Right lower extremity erythema warm to touch erythematous extending proximally Skin: No rashes, lesions or ulcers Psychiatry: Judgement and insight appear normal. Mood & affect appropriate.    Data Reviewed:    Labs: Basic Metabolic Panel: Recent Labs  Lab 08/23/22 1243 08/24/22 0556  NA 132* 135  K 3.6 3.4*  CL 97* 102  CO2 23 24  GLUCOSE 82 97  BUN 7 5*  CREATININE 0.83 0.67  CALCIUM 8.6* 8.4*   GFR CrCl cannot be calculated (Unknown ideal weight.). Liver Function Tests: No results for input(s): "AST", "ALT", "ALKPHOS", "BILITOT",  "PROT", "ALBUMIN" in the last 168 hours. No results for input(s): "LIPASE", "AMYLASE" in the last 168 hours. No results for input(s): "AMMONIA" in the last 168 hours. Coagulation profile No results for input(s): "INR", "PROTIME" in the last 168 hours. COVID-19 Labs  No results for input(s): "DDIMER", "FERRITIN", "LDH", "CRP" in the last 72 hours.  Lab Results  Component Value Date   SARSCOV2NAA NEGATIVE 05/11/2019    CBC: Recent Labs  Lab 08/23/22 1243 08/24/22 0556  WBC 10.0 7.2  NEUTROABS 7.2  --   HGB 10.6* 10.6*  HCT 31.7* 32.5*  MCV 89.3 90.5  PLT 288 302   Cardiac Enzymes: No results for input(s): "CKTOTAL", "CKMB", "CKMBINDEX", "TROPONINI" in the last 168 hours. BNP (last 3 results) No results for input(s): "PROBNP" in the last 8760 hours. CBG: No results for input(s): "GLUCAP" in the last 168 hours. D-Dimer: No results for input(s): "DDIMER" in the last 72 hours. Hgb A1c: No results for input(s): "HGBA1C" in the last 72 hours. Lipid Profile: No results for input(s): "CHOL", "HDL", "LDLCALC", "TRIG", "CHOLHDL", "LDLDIRECT" in the last 72 hours. Thyroid function studies: No results for input(s): "TSH", "T4TOTAL", "T3FREE", "THYROIDAB" in the last 72 hours.  Invalid input(s): "FREET3" Anemia work up: No results for input(s): "VITAMINB12", "FOLATE", "FERRITIN", "TIBC", "IRON", "RETICCTPCT" in the last 72 hours. Sepsis Labs: Recent Labs  Lab 08/23/22 1243 08/23/22 1400 08/24/22 0556  WBC 10.0  --  7.2  LATICACIDVEN 1.1 0.8  --    Microbiology Recent Results (from the past 240 hour(s))  Culture, blood (Routine X 2) w Reflex to ID Panel     Status: None (Preliminary result)   Collection Time: 08/23/22 12:43 PM   Specimen: Left Antecubital; Blood  Result Value Ref Range Status   Specimen Description   Final    LEFT ANTECUBITAL BLOOD Performed at [facility], [address] [place], [place] [address]    Special Requests   Final    BOTTLES DRAWN  AEROBIC AND ANAEROBIC Blood Culture adequate volume Performed at [facility], [address] [place], [place] [address]    Culture   Final    NO GROWTH < 12 HOURS Performed at [facility], [address] [place], [place] [address]    Report Status PENDING  Incomplete  Culture, blood (Routine X 2) w Reflex to ID Panel     Status: None (Preliminary result)   Collection Time: 08/23/22 12:43 PM   Specimen: BLOOD RIGHT FOREARM  Result Value Ref Range Status   Specimen Description   Final    BLOOD RIGHT FOREARM Performed at [facility], [address] [place], [place] [address]    Special Requests   Final    BOTTLES DRAWN AEROBIC AND ANAEROBIC Blood Culture results may not be optimal due to an excessive volume of blood received in culture bottles Performed at [facility], [address] [place], [place] [address]    Culture   Final    NO GROWTH < 12 HOURS Performed at [facility], [address] [place], [place] [address]    Report Status PENDING  Incomplete     Medications:    buprenorphine-naloxone  1 tablet Sublingual TID   DULoxetine  60 mg Oral Daily   enoxaparin (LOVENOX) injection  40 mg Subcutaneous Q24H   hydroxychloroquine  200 mg Oral BID   levothyroxine  150 mcg Oral Q0600   nicotine  21 mg Transdermal Once   pregabalin  200 mg Oral BID   traZODone  100 mg Oral QHS   Continuous Infusions:  cefTRIAXone (ROCEPHIN)  IV 1 g (08/23/22 1701)   vancomycin 1,250 mg (08/23/22 2146)      LOS: 1 day   Marinda Elk  [facility]  08/24/2022, 8:27 AM

## 2022-08-25 DIAGNOSIS — L03115 Cellulitis of right lower limb: Secondary | ICD-10-CM | POA: Diagnosis not present

## 2022-08-25 DIAGNOSIS — I872 Venous insufficiency (chronic) (peripheral): Secondary | ICD-10-CM | POA: Diagnosis not present

## 2022-08-25 DIAGNOSIS — I890 Lymphedema, not elsewhere classified: Secondary | ICD-10-CM | POA: Diagnosis not present

## 2022-08-25 LAB — BASIC METABOLIC PANEL
Anion gap: 4 — ABNORMAL LOW (ref 5–15)
BUN: 5 mg/dL — ABNORMAL LOW (ref 6–20)
CO2: 24 mmol/L (ref 22–32)
Calcium: 8.5 mg/dL — ABNORMAL LOW (ref 8.9–10.3)
Chloride: 107 mmol/L (ref 98–111)
Creatinine, Ser: 0.61 mg/dL (ref 0.44–1.00)
GFR, Estimated: >60 mL/min (ref >60)
Glucose, Bld: 101 mg/dL — ABNORMAL HIGH (ref 70–99)
Potassium: 4.5 mmol/L (ref 3.5–5.1)
Sodium: 135 mmol/L (ref 135–145)

## 2022-08-25 MED ORDER — KETOROLAC TROMETHAMINE 15 MG/ML IJ SOLN: 15.0000 mg | Freq: Four times a day (QID) | INTRAMUSCULAR | Status: DC

## 2022-08-25 MED ORDER — ACETAMINOPHEN 325 MG PO TABS: 650.0000 mg | ORAL_TABLET | ORAL | Status: DC | PRN

## 2022-08-25 MED ORDER — MORPHINE SULFATE (PF) 2 MG/ML IV SOLN
2.0000 mg | INTRAVENOUS | Status: DC | PRN
  Administered 2022-08-25: 2 mg via INTRAVENOUS
  Filled 2022-08-25: qty 1

## 2022-08-25 MED ORDER — MORPHINE SULFATE (PF) 2 MG/ML IV SOLN: 2.0000 mg | INTRAVENOUS | Status: DC | PRN

## 2022-08-25 NOTE — Discharge Summary (Signed)
Physician Discharge Summary   [name] ZOX:[id number] DOB: 03/29/1961 DOA: 08/23/2022  PCP: Oneita HurtPcp, No  Admit date: 08/23/2022 Discharge date: 08/25/2022   Admitted From: Home Disposition:  Home Discharging physician: Lewie Chamberavid Arrion Burruel, MD Barriers to discharge: none  Recommendations at discharge: Continue outpatient wound care  Discharge Condition: stable CODE STATUS: Full Diet recommendation:  Diet Orders (From admission, onward)     Start     Ordered   08/25/22 0000  Diet general        08/25/22 1316   08/23/22 1539  Diet regular Room service appropriate? Yes; Fluid consistency: Thin  Diet effective now       Question Answer Comment  Room service appropriate? Yes   Fluid consistency: Thin      08/23/22 1539            Hospital Course: Right lower extremity cellulitis: Started empirically on IV vancomycin and Rocephin. Has remained afebrile; no leukocytosis -Erythema marked with skin marker on admission and showed improvement and recession prior to discharge -Patient recommended to resume home antibiotic regimen at discharge   Chronic venous insufficiency/ Lymphedema/Open wound of left lower extremity -Patient follows outpatient with wound care and recommended to continue at discharge -Vaseline dressing and bandage recommended for left lower extremity skin tear -No signs of infection involving left leg   Fibromyalgia: Continue Cymbalta.   Hypovolemic hyponatremia: -Treated with IV fluids and recommended to continue oral hydration at discharge   Hypokalemia: Repleted   Normocytic anemia: MCV is borderline, high likely iron deficiency anemia. Anemia panel will be unreliable in the setting of infectious etiology. Will need to follow-up with PCP as an outpatient. No signs of overt bleeding.   Opioid dependence Continue Suboxone   Principal Diagnosis: Lower extremity cellulitis  Discharge Diagnoses: Active Hospital Problems   Diagnosis Date Noted   Lower  extremity cellulitis 08/23/2022   Open wound of left lower extremity 08/23/2022   Non-healing wound of left lower extremity 08/23/2022   Lymphedema 08/13/2019   Chronic venous insufficiency 08/13/2019   Fibromyalgia 03/31/2012    Resolved Hospital Problems  No resolved problems to display.     Discharge Instructions     Diet general   Complete by: As directed    Discharge wound care:   Complete by: As directed    Resume wound care outpatient. Keep left leg skin laceration covered with Vaseline dressing   Increase activity slowly   Complete by: As directed       Allergies as of 08/25/2022       Reactions   Amitriptyline Nausea And Vomiting, Swelling, Palpitations   Increases heart rate   Methotrexate Nausea And Vomiting        Medication List     STOP taking these medications    torsemide 20 MG tablet Commonly known as: DEMADEX       TAKE these medications    albuterol 108 (90 Base) MCG/ACT inhaler Commonly known as: VENTOLIN HFA Inhale 2 puffs into the lungs every 4 (four) hours as needed for wheezing or shortness of breath.   BAYER BACK & BODY PO Take 2-3 tablets by mouth as needed (pain).   Buprenorphine HCl-Naloxone HCl 8-2 MG Film Place 1 Film under the tongue 3 (three) times daily.   carisoprodol 350 MG tablet Commonly known as: SOMA Take 350 mg by mouth daily.   cephALEXin 500 MG capsule Commonly known as: KEFLEX Take 500 mg by mouth 4 (four) times daily. For 10 days   DULoxetine  60 MG capsule Commonly known as: CYMBALTA Take 60 mg by mouth daily.   HAIR SKIN & NAILS PO Take 1 tablet by mouth daily.   hydroxychloroquine 200 MG tablet Commonly known as: PLAQUENIL Take 200 mg by mouth 2 (two) times daily.   hydrOXYzine 50 MG capsule Commonly known as: VISTARIL Take 50 mg by mouth 3 (three) times daily as needed for anxiety.   levothyroxine 150 MCG tablet Commonly known as: SYNTHROID Take 150 mcg by mouth daily before breakfast.    loratadine 10 MG tablet Commonly known as: CLARITIN Take 10 mg by mouth daily.   OVER THE COUNTER MEDICATION Take 1 tablet by mouth daily. Natures B[employer] Fruits and Veggies   OVER THE COUNTER MEDICATION Take 1 tablet by mouth daily. Lymph Drainage supplement   pantoprazole 40 MG tablet Commonly known as: PROTONIX Take 40 mg by mouth daily.   POTASSIUM PO Take 1 tablet by mouth daily.   pregabalin 200 MG capsule Commonly known as: LYRICA Take 200 mg by mouth 2 (two) times daily.   PROBIOTIC PO Take 1 capsule by mouth daily.   sulfamethoxazole-trimethoprim 800-160 MG tablet Commonly known as: BACTRIM DS Take 1 tablet by mouth 2 (two) times daily. For 10 days   traZODone 100 MG tablet Commonly known as: DESYREL Take 100 mg by mouth at bedtime as needed for sleep.   VITAMIN C PO Take 1 tablet by mouth daily.   VITAMIN D PO Take 1 capsule by mouth daily.               Discharge Care Instructions  (From admission, onward)           Start     Ordered   08/25/22 0000  Discharge wound care:       Comments: Resume wound care outpatient. Keep left leg skin laceration covered with Vaseline dressing   08/25/22 1316            Allergies  Allergen Reactions   Amitriptyline Nausea And Vomiting, Swelling and Palpitations    Increases heart rate    Methotrexate Nausea And Vomiting    Consultations:   Procedures:   Discharge Exam: BP (!) 133/91 (BP Location: Left Arm)   Pulse 91   Temp 98 F (36.7 C) (Oral)   Resp 18   Ht 5\' 3"  (1.6 m)   Wt 94 kg   SpO2 97%   BMI 36.71 kg/m  Physical Exam Constitutional:      General: She is not in acute distress.    Appearance: Normal appearance. She is not ill-appearing.  HENT:     Head: Normocephalic and atraumatic.     Mouth/Throat:     Mouth: Mucous membranes are moist.  Eyes:     Extraocular Movements: Extraocular movements intact.  Cardiovascular:     Rate and Rhythm: Normal rate and regular  rhythm.  Pulmonary:     Effort: Pulmonary effort is normal. No respiratory distress.     Breath sounds: Normal breath sounds. No wheezing.  Abdominal:     General: Bowel sounds are normal. There is no distension.     Palpations: Abdomen is soft.     Tenderness: There is no abdominal tenderness.  Musculoskeletal:     Cervical back: Normal range of motion and neck supple.     Comments: B/L LE  noted with lymphedema worse in RLE. The RLE has chronic stasis dermatitis skin changes (worse than LLE). LLE is wrapped with dressing and underlying skin tear  noted from picture. RLE unwrapped and has no open wound or weeping ulcer. The erythema has shows recession from skin marker. There is mild calor. Compartment is soft but patient showing disproportionate reaction to light palpation of right leg and is not consistent with compartment syndrome pain; leg is less tender with palpation under distraction   Skin:    General: Skin is warm and dry.  Neurological:     General: No focal deficit present.     Mental Status: She is alert.  Psychiatric:        Mood and Affect: Mood normal.      The results of significant diagnostics from this hospitalization (including imaging, microbiology, ancillary and laboratory) are listed below for reference.   Microbiology: Recent Results (from the past 240 hour(s))  Culture, blood (Routine X 2) w Reflex to ID Panel     Status: None (Preliminary result)   Collection Time: 08/23/22 12:43 PM   Specimen: Left Antecubital; Blood  Result Value Ref Range Status   Specimen Description   Final    LEFT ANTECUBITAL BLOOD Performed at [facility], [address], [place], [place] [address]    Special Requests   Final    BOTTLES DRAWN AEROBIC AND ANAEROBIC Blood Culture adequate volume Performed at [facility], [address], [place], [place] [address]    Culture   Final    NO GROWTH 2 DAYS Performed at [facility], [address],  [place], [place] [address]    Report Status PENDING  Incomplete  Culture, blood (Routine X 2) w Reflex to ID Panel     Status: None (Preliminary result)   Collection Time: 08/23/22 12:43 PM   Specimen: BLOOD RIGHT FOREARM  Result Value Ref Range Status   Specimen Description   Final    BLOOD RIGHT FOREARM Performed at [facility], [address], [place], [place] [address]    Special Requests   Final    BOTTLES DRAWN AEROBIC AND ANAEROBIC Blood Culture results may not be optimal due to an excessive volume of blood received in culture bottles Performed at [facility], [address], [place], [place] [address]    Culture   Final    NO GROWTH 2 DAYS Performed at [facility], [address], [place], [place] [address]    Report Status PENDING  Incomplete     Labs: BNP (last 3 results) No results for input(s): "BNP" in the last 8760 hours. Basic Metabolic Panel: Recent Labs  Lab 08/23/22 1243 08/24/22 0556 08/25/22 0535  NA 132* 135 135  K 3.6 3.4* 4.5  CL 97* 102 107  CO2 23 24 24   GLUCOSE 82 97 101*  BUN 7 5* 5*  CREATININE 0.83 0.67 0.61  CALCIUM 8.6* 8.4* 8.5*   Liver Function Tests: No results for input(s): "AST", "ALT", "ALKPHOS", "BILITOT", "PROT", "ALBUMIN" in the last 168 hours. No results for input(s): "LIPASE", "AMYLASE" in the last 168 hours. No results for input(s): "AMMONIA" in the last 168 hours. CBC: Recent Labs  Lab 08/23/22 1243 08/24/22 0556  WBC 10.0 7.2  NEUTROABS 7.2  --   HGB 10.6* 10.6*  HCT 31.7* 32.5*  MCV 89.3 90.5  PLT 288 302   Cardiac Enzymes: No results for input(s): "CKTOTAL", "CKMB", "CKMBINDEX", "TROPONINI" in the last 168 hours. BNP: Invalid input(s): "POCBNP" CBG: No results for input(s): "GLUCAP" in the last 168 hours. D-Dimer No results for input(s): "DDIMER"  in the last 72 hours. Hgb A1c No results for input(s): "HGBA1C" in the last 72 hours. Lipid Profile No results for  input(s): "CHOL", "HDL", "LDLCALC", "TRIG", "CHOLHDL", "LDLDIRECT" in the last 72 hours. Thyroid function studies No results for input(s): "TSH", "T4TOTAL", "T3FREE", "THYROIDAB" in the last 72 hours.  Invalid input(s): "FREET3" Anemia work up No results for input(s): "VITAMINB12", "FOLATE", "FERRITIN", "TIBC", "IRON", "RETICCTPCT" in the last 72 hours. Urinalysis    Component Value Date/Time   COLORURINE YELLOW 10/04/2019 2200   APPEARANCEUR CLEAR 10/04/2019 2200   LABSPEC 1.015 10/04/2019 2200   PHURINE 7.5 10/04/2019 2200   GLUCOSEU NEGATIVE 10/04/2019 2200   HGBUR NEGATIVE 10/04/2019 2200   BILIRUBINUR NEGATIVE 10/04/2019 2200   BILIRUBINUR neg 11/25/2013 0916   KETONESUR NEGATIVE 10/04/2019 2200   PROTEINUR NEGATIVE 10/04/2019 2200   UROBILINOGEN 0.2 11/25/2013 0916   UROBILINOGEN 0.2 12/05/2008 1236   NITRITE NEGATIVE 10/04/2019 2200   LEUKOCYTESUR TRACE (A) 10/04/2019 2200   Sepsis Labs Recent Labs  Lab 08/23/22 1243 08/24/22 0556  WBC 10.0 7.2   Microbiology Recent Results (from the past 240 hour(s))  Culture, blood (Routine X 2) w Reflex to ID Panel     Status: None (Preliminary result)   Collection Time: 08/23/22 12:43 PM   Specimen: Left Antecubital; Blood  Result Value Ref Range Status   Specimen Description   Final    LEFT ANTECUBITAL BLOOD Performed at [facility], [address], [place], [place] [address]    Special Requests   Final    BOTTLES DRAWN AEROBIC AND ANAEROBIC Blood Culture adequate volume Performed at [facility], [address] [place], [place] [address]    Culture   Final    NO GROWTH 2 DAYS Performed at [facility], [address], [place], [place] [address]    Report Status PENDING  Incomplete  Culture, blood (Routine X 2) w Reflex to ID Panel     Status: None (Preliminary result)   Collection Time: 08/23/22 12:43 PM   Specimen: BLOOD RIGHT FOREARM  Result Value Ref Range Status   Specimen  Description   Final    BLOOD RIGHT FOREARM Performed at [facility], [address], [place], [place] [address]    Special Requests   Final    BOTTLES DRAWN AEROBIC AND ANAEROBIC Blood Culture results may not be optimal due to an excessive volume of blood received in culture bottles Performed at [facility], [address], [place], [place] [address]    Culture   Final    NO GROWTH 2 DAYS Performed at [facility], [address] [place], [place] [address]    Report Status PENDING  Incomplete    Procedures/Studies: No results found.   Time coordinating discharge: Over 30 minutes    Lewie Chamber, MD  [facility] 08/25/2022, 3:35 PM

## 2022-08-25 NOTE — Hospital Course (Signed)
Right lower extremity cellulitis: Started empirically on IV vancomycin and Rocephin. Has remained afebrile; no leukocytosis -Erythema marked with skin marker on admission and showed improvement and recession prior to discharge -Patient recommended to resume home antibiotic regimen at discharge   Chronic venous insufficiency/ Lymphedema/Open wound of left lower extremity -Patient follows outpatient with wound care and recommended to continue at discharge -Vaseline dressing and bandage recommended for left lower extremity skin tear -No signs of infection involving left leg   Fibromyalgia: Continue Cymbalta.   Hypovolemic hyponatremia: -Treated with IV fluids and recommended to continue oral hydration at discharge   Hypokalemia: Repleted   Normocytic anemia: MCV is borderline, high likely iron deficiency anemia. Anemia panel will be unreliable in the setting of infectious etiology. Will need to follow-up with PCP as an outpatient. No signs of overt bleeding.   Opioid dependence Continue Suboxone

## 2022-08-28 LAB — CULTURE, BLOOD (ROUTINE X 2)
Culture: NO GROWTH
Culture: NO GROWTH
Special Requests: ADEQUATE

## 2023-07-29 ENCOUNTER — Encounter: Payer: Self-pay | Admitting: Physician Assistant

## 2023-07-29 ENCOUNTER — Other Ambulatory Visit: Payer: Self-pay | Admitting: Physician Assistant

## 2023-07-29 ENCOUNTER — Other Ambulatory Visit: Payer: Self-pay

## 2023-07-29 ENCOUNTER — Encounter (HOSPITAL_COMMUNITY): Payer: Self-pay | Admitting: Orthopedic Surgery

## 2023-07-29 DIAGNOSIS — S52502A Unspecified fracture of the lower end of left radius, initial encounter for closed fracture: Secondary | ICD-10-CM

## 2023-07-29 NOTE — H&P (Signed)
 PREOPERATIVE H&P  Chief Complaint: LEFT WRIST FRACTURE  HPI: [name] is a 63 y.o. female who presents with a diagnosis of LEFT WRIST FRACTURE. Symptoms are rated as moderate to severe, and have been worsening.  This is significantly impairing activities of daily living.  She has elected for surgical management.   Past Medical History:  Diagnosis Date   Acute pericarditis    a. 04/2014 Cath: nl cors, EF 65-70%;  b. 04/2014 Echo: EF 65-70%, mild TR.   Allergy    Anal fissure    Anemia    Anxiety    Bipolar 1 disorder (HCC)    Chronic lower back pain    Chronic pain    Exposed orthopaedic hardware (HCC) 01/02/2021   Fibromyalgia    GERD (gastroesophageal reflux disease)    HLD (hyperlipidemia)    Hypothyroidism    MRSA carrier 01/02/2021   RA (rheumatoid arthritis) (HCC)    "all over"   Substance abuse (HCC)    Wrist fracture    right   Past Surgical History:  Procedure Laterality Date   ABDOMINAL HYSTERECTOMY  03/2004   BACK SURGERY     CESAREAN SECTION  1993; 1999   COLONOSCOPY     FOOT SURGERY Right 01/10/2016   GASTRIC BYPASS  2001   LEFT HEART CATHETERIZATION WITH CORONARY ANGIOGRAM N/A 04/23/2014   Normal coronaries and LVF   OPEN REDUCTION INTERNAL FIXATION (ORIF) DISTAL RADIAL FRACTURE Right 10/31/2017   Procedure: OPEN REDUCTION INTERNAL FIXATION (ORIF) DISTAL RADIAL FRACTURE;  Surgeon: Bjorn Pippin, MD;  Location: [facility] OR;  Service: Orthopedics;  Laterality: Right;   SHOULDER ARTHROSCOPY W/ ROTATOR CUFF REPAIR Right ~ 2011   TARSAL METATARSAL FUSION WITH WEIL OSTEOTOMY Left 2013   TONSILLECTOMY     TOTAL SHOULDER ARTHROPLASTY     Social History   Socioeconomic History   Marital status: Divorced    Spouse name: Not on file   Number of children: 2   Years of education: Not on file   Highest education level: Not on file  Occupational History   Occupation: Unemployed  Tobacco Use   Smoking status: Every Day    Current packs/day: 1.00    Average packs/day:  1 pack/day for 34.0 years (34.0 ttl pk-yrs)    Types: Cigarettes   Smokeless tobacco: Never  Vaping Use   Vaping status: Never Used  Substance and Sexual Activity   Alcohol use: No   Drug use: Not Currently    Types: Cocaine    Comment: Has not used cocaine since 05/12/2019   Sexual activity: Not Currently  Other Topics Concern   Not on file  Social History Narrative   Right handed   Lives in two story home alone with a stair lift.   Trying to get disability   Social Drivers of Health   Financial Resource Strain: Patient Declined (02/12/2022)   Received from [facility], [facility]   Overall Financial Resource Strain (CARDIA)    Difficulty of Paying Living Expenses: Patient declined  Food Insecurity: Low Risk  (12/13/2022)   Received from [facility]   Hunger Vital Sign    Worried About Running Out of Food in the Last Year: Never true    Ran Out of Food in the Last Year: Never true  Transportation Needs: No Transportation Needs (12/13/2022)   Received from [facility]    In the past 12 months, has lack of reliable transportation kept you from medical appointments, meetings, work  or from getting things needed for daily living? : No  Physical Activity: Not on file  Stress: Stress Concern Present (02/12/2022)   Received from [facility], [facility] - Occupational Stress Questionnaire    Feeling of Stress : Rather much  Social Connections: Unknown (09/25/2021)   Received from [facility]   Social Network    Social Network: Not on file   Family History  Problem Relation Age of Onset   Thyroid disease Mother    Colon polyps Father    Colon polyps Sister    Heart attack Maternal Grandfather    Colon polyps Paternal Grandmother    Colon cancer Neg Hx    Allergies  Allergen Reactions   Amitriptyline Nausea And Vomiting, Swelling and Palpitations    Increases heart rate    Methotrexate Nausea And  Vomiting   Prior to Admission medications   Medication Sig Start Date End Date Taking? Authorizing Provider  albuterol (VENTOLIN HFA) 108 (90 Base) MCG/ACT inhaler Inhale 2 puffs into the lungs every 4 (four) hours as needed for wheezing or shortness of breath. 02/22/19  Yes [provider]  Ascorbic Acid (VITAMIN C PO) Take 750 mg by mouth daily.   Yes [provider]  Aspirin-Caffeine (BAYER BACK & BODY PO) Take 2 tablets by mouth daily as needed (pain).   Yes [provider]  fluticasone (FLONASE) 50 MCG/ACT nasal spray Place 2 sprays into both nostrils daily as needed for allergies or rhinitis.   Yes [provider]  HYDROcodone-acetaminophen (NORCO) 10-325 MG tablet Take 1 tablet by mouth every 8 (eight) hours as needed for moderate pain (pain score 4-6). 07/21/23  Yes [provider]  hydroxychloroquine (PLAQUENIL) 200 MG tablet Take 200 mg by mouth 2 (two) times daily.   Yes [provider]  ibuprofen (ADVIL) 800 MG tablet Take 800 mg by mouth every 8 (eight) hours as needed for mild pain (pain score 1-3) or moderate pain (pain score 4-6).   Yes [provider]  levothyroxine (SYNTHROID) 150 MCG tablet Take 150 mcg by mouth daily before breakfast.   Yes [provider]  Lysine 1000 MG TABS Take 1,000 mg by mouth daily.   Yes [provider]  MAGNESIUM OXIDE PO Take 500 mg by mouth daily.   Yes [provider]  minoxidil (ROGAINE) 2 % external solution Apply 1 application  topically 2 (two) times daily.   Yes [provider]  Multiple Vitamins-Minerals (HAIR SKIN & NAILS PO) Take 2 tablets by mouth daily.   Yes [provider]  pantoprazole (PROTONIX) 40 MG tablet Take 40 mg by mouth daily as needed (heartburn). 03/22/22  Yes [provider]  pregabalin (LYRICA) 200 MG capsule Take 200 mg by mouth 2 (two) times daily.   Yes [provider]  Probiotic Product (PROBIOTIC PO)  Take 1 capsule by mouth 2 (two) times daily. prebiotic   Yes [provider]  rosuvastatin (CRESTOR) 10 MG tablet Take 10 mg by mouth daily.   Yes [provider]  traZODone (DESYREL) 100 MG tablet Take 100 mg by mouth at bedtime as needed for sleep.   Yes [provider]  Vitamin D-Vitamin K (VITAMIN K2-VITAMIN D3 PO) Take 2 mg by mouth daily.   Yes [provider]     Positive ROS: All other systems have been reviewed and were otherwise negative with the exception of those mentioned in the HPI and as above.  Physical Exam: General: Alert, no acute distress Cardiovascular: No pedal edema Respiratory: No cyanosis, no use of accessory musculature GI: No organomegaly, abdomen is soft and non-tender Skin: No lesions in the area of chief complaint Neurologic: Sensation intact distally Psychiatric: Patient is competent for consent with normal mood and affect Lymphatic: No axillary or cervical lymphadenopathy  MUSCULOSKELETAL: in splint, can wiggle fingers, NVI   Imaging: CT pending but xrays in office show comminuted intra-articular distal radius fracture    Assessment: LEFT WRIST FRACTURE  Plan: Plan for Procedure(s): OPEN REDUCTION INTERNAL FIXATION (ORIF) DISTAL RADIUS FRACTURE  The risks benefits and alternatives were discussed with the patient including but not limited to the risks of nonoperative treatment, versus surgical intervention including infection, bleeding, nerve injury,  blood clots, cardiopulmonary complications, morbidity, mortality, among others, and they were willing to proceed.   Weightbearing: NWB LUE Orthopedic devices: splint Showering: POD 3 Dressing: reinforce PRN Medicines: on chronic Norco 10-325mg  q8h; ASA, Oxy, Mobic, Zofran  Discharge: home Follow up: 08/12/23 at 11:15am    Jenne Pane, PA-C Office [phone] 07/29/2023 2:00 PM

## 2023-07-29 NOTE — Progress Notes (Addendum)
 PCP - no  Cardiologist - not seen in years  PPM/ICD -  Device Orders -  Rep Notified -   Chest x-ray -  EKG -  Stress Test -  ECHO - 04-29-23 CE Cardiac Cath -   Sleep Study -  CPAP -   Fasting Blood Sugar -  Checks Blood Sugar _____ times a day  Blood Thinner Instructions:N/A Aspirin Instructions:  ERAS Protcol -    COVID vaccine -  Activity--Able to climb a flight of stairs without CP or SOB  Anesthesia review: lymphedema bil legs with open sores pt. States Eulah Pont and [place] office aware. Pt receives wound care 2 x a week  Patient denies shortness of breath, fever, cough and chest pain at PAT appointment   All instructions explained to the patient, with a verbal understanding of the material. Patient agrees to go over the instructions while at home for a better understanding. Patient also instructed to self quarantine after being tested for COVID-19. The opportunity to ask questions was provided.

## 2023-07-29 NOTE — Progress Notes (Signed)
Attempted to obtain medical history. Unable to reach pt. At this time. HIPAA complaint voicemail left with pre-surgical testing number. 

## 2023-07-29 NOTE — Progress Notes (Signed)
 Surgery orders requested via Epic inbox.

## 2023-08-01 ENCOUNTER — Ambulatory Visit
Admission: RE | Admit: 2023-08-01 | Discharge: 2023-08-01 | Disposition: A | Discharge status: Home / Self Care | Source: Ambulatory Visit | Attending: Physician Assistant | Admitting: Physician Assistant

## 2023-08-01 DIAGNOSIS — S52502A Unspecified fracture of the lower end of left radius, initial encounter for closed fracture: Secondary | ICD-10-CM

## 2023-08-01 NOTE — Progress Notes (Signed)
 37 Spoke with [name] made aware of surgery time change, asked to arrive at 0900 for 1116 surgery tomorrow.  Told she could have clear liquids until 0800.  [name] verbalized understanding of new time change for surgery and other mentioned instructions.

## 2023-08-02 ENCOUNTER — Ambulatory Visit (HOSPITAL_COMMUNITY)
Admission: RE | Admit: 2023-08-02 | Discharge: 2023-08-02 | Disposition: A | Discharge status: Home / Self Care | Attending: Orthopedic Surgery | Admitting: Orthopedic Surgery

## 2023-08-02 ENCOUNTER — Other Ambulatory Visit: Payer: Self-pay

## 2023-08-02 ENCOUNTER — Encounter (HOSPITAL_COMMUNITY): Payer: Self-pay | Admitting: Orthopedic Surgery

## 2023-08-02 ENCOUNTER — Encounter (HOSPITAL_COMMUNITY)
Admission: RE | Disposition: A | Discharge status: Home / Self Care | Payer: Self-pay | Source: Home / Self Care | Attending: Orthopedic Surgery

## 2023-08-02 ENCOUNTER — Ambulatory Visit (HOSPITAL_COMMUNITY): Payer: Self-pay

## 2023-08-02 DIAGNOSIS — K219 Gastro-esophageal reflux disease without esophagitis: Secondary | ICD-10-CM | POA: Diagnosis not present

## 2023-08-02 DIAGNOSIS — S52502A Unspecified fracture of the lower end of left radius, initial encounter for closed fracture: Secondary | ICD-10-CM | POA: Diagnosis present

## 2023-08-02 DIAGNOSIS — M797 Fibromyalgia: Secondary | ICD-10-CM | POA: Diagnosis not present

## 2023-08-02 DIAGNOSIS — X58XXXA Exposure to other specified factors, initial encounter: Secondary | ICD-10-CM | POA: Diagnosis not present

## 2023-08-02 DIAGNOSIS — F17210 Nicotine dependence, cigarettes, uncomplicated: Secondary | ICD-10-CM | POA: Insufficient documentation

## 2023-08-02 DIAGNOSIS — Z7982 Long term (current) use of aspirin: Secondary | ICD-10-CM | POA: Diagnosis not present

## 2023-08-02 DIAGNOSIS — Z560 Unemployment, unspecified: Secondary | ICD-10-CM | POA: Insufficient documentation

## 2023-08-02 DIAGNOSIS — E039 Hypothyroidism, unspecified: Secondary | ICD-10-CM | POA: Insufficient documentation

## 2023-08-02 DIAGNOSIS — Z01818 Encounter for other preprocedural examination: Secondary | ICD-10-CM

## 2023-08-02 DIAGNOSIS — Z79899 Other long term (current) drug therapy: Secondary | ICD-10-CM | POA: Insufficient documentation

## 2023-08-02 HISTORY — PX: OPEN REDUCTION INTERNAL FIXATION (ORIF) DISTAL RADIAL FRACTURE: SHX5989

## 2023-08-02 LAB — BASIC METABOLIC PANEL
Anion gap: 10 (ref 5–15)
BUN: 8 mg/dL (ref 6–20)
CO2: 27 mmol/L (ref 22–32)
Calcium: 8.7 mg/dL — ABNORMAL LOW (ref 8.9–10.3)
Chloride: 96 mmol/L — ABNORMAL LOW (ref 98–111)
Creatinine, Ser: 0.34 mg/dL — ABNORMAL LOW (ref 0.44–1.00)
GFR, Estimated: >60 mL/min (ref >60)
Glucose, Bld: 92 mg/dL (ref 70–99)
Potassium: 4 mmol/L (ref 3.5–5.1)
Sodium: 133 mmol/L — ABNORMAL LOW (ref 135–145)

## 2023-08-02 LAB — CBC
HCT: 33.3 % — ABNORMAL LOW (ref 36.0–46.0)
Hemoglobin: 10.2 g/dL — ABNORMAL LOW (ref 12.0–15.0)
MCH: 29.3 pg (ref 26.0–34.0)
MCHC: 30.6 g/dL (ref 30.0–36.0)
MCV: 95.7 fL (ref 80.0–100.0)
Platelets: 325 10*3/uL (ref 150–400)
RBC: 3.48 MIL/uL — ABNORMAL LOW (ref 3.87–5.11)
RDW: 14.8 % (ref 11.5–15.5)
WBC: 6 10*3/uL (ref 4.0–10.5)
nRBC: 0 % (ref 0.0–0.2)

## 2023-08-02 LAB — SURGICAL PCR SCREEN
MRSA, PCR: POSITIVE — AB
Staphylococcus aureus: POSITIVE — AB

## 2023-08-02 SURGERY — OPEN REDUCTION INTERNAL FIXATION (ORIF) DISTAL RADIUS FRACTURE
Anesthesia: Regional | Laterality: Left

## 2023-08-02 MED ORDER — FENTANYL CITRATE PF 50 MCG/ML IJ SOSY
100.0000 ug | PREFILLED_SYRINGE | Freq: Once | INTRAMUSCULAR | Status: AC
  Administered 2023-08-02: 50 ug via INTRAVENOUS
  Filled 2023-08-02: qty 2

## 2023-08-02 MED ORDER — DROPERIDOL 2.5 MG/ML IJ SOLN: 0.6250 mg | Freq: Once | INTRAMUSCULAR | Status: DC | PRN

## 2023-08-02 MED ORDER — LACTATED RINGERS IV SOLN: INTRAVENOUS | Status: DC

## 2023-08-02 MED ORDER — ONDANSETRON HCL 4 MG/2ML IJ SOLN
INTRAMUSCULAR | Status: AC
  Filled 2023-08-02: qty 2

## 2023-08-02 MED ORDER — MIDAZOLAM HCL 2 MG/2ML IJ SOLN
INTRAMUSCULAR | Status: AC
  Filled 2023-08-02: qty 2

## 2023-08-02 MED ORDER — CEFAZOLIN SODIUM-DEXTROSE 2-4 GM/100ML-% IV SOLN
2.0000 g | INTRAVENOUS | Status: AC
  Administered 2023-08-02: 2 g via INTRAVENOUS
  Filled 2023-08-02: qty 100

## 2023-08-02 MED ORDER — DEXAMETHASONE SODIUM PHOSPHATE 10 MG/ML IJ SOLN
INTRAMUSCULAR | Status: DC | PRN
  Administered 2023-08-02: 8 mg via INTRAVENOUS

## 2023-08-02 MED ORDER — ONDANSETRON HCL 4 MG/2ML IJ SOLN
INTRAMUSCULAR | Status: DC | PRN
  Administered 2023-08-02: 4 mg via INTRAVENOUS

## 2023-08-02 MED ORDER — PHENYLEPHRINE 80 MCG/ML (10ML) SYRINGE FOR IV PUSH (FOR BLOOD PRESSURE SUPPORT)
PREFILLED_SYRINGE | INTRAVENOUS | Status: DC | PRN
Start: 2023-08-02 — End: 2023-08-02
  Administered 2023-08-02: 120 ug via INTRAVENOUS

## 2023-08-02 MED ORDER — PROPOFOL 10 MG/ML IV BOLUS
INTRAVENOUS | Status: AC
  Filled 2023-08-02: qty 20

## 2023-08-02 MED ORDER — FENTANYL CITRATE (PF) 100 MCG/2ML IJ SOLN
INTRAMUSCULAR | Status: AC
  Filled 2023-08-02: qty 2

## 2023-08-02 MED ORDER — ACETAMINOPHEN 10 MG/ML IV SOLN: 1000.0000 mg | Freq: Once | INTRAVENOUS | Status: DC | PRN

## 2023-08-02 MED ORDER — FENTANYL CITRATE PF 50 MCG/ML IJ SOSY: 25.0000 ug | PREFILLED_SYRINGE | INTRAMUSCULAR | Status: DC | PRN

## 2023-08-02 MED ORDER — EPHEDRINE SULFATE-NACL 50-0.9 MG/10ML-% IV SOSY
PREFILLED_SYRINGE | INTRAVENOUS | Status: DC | PRN
  Administered 2023-08-02: 7.5 mg via INTRAVENOUS

## 2023-08-02 MED ORDER — ACETAMINOPHEN 500 MG PO TABS: 500.0000 mg | ORAL_TABLET | Freq: Four times a day (QID) | ORAL | 0 refills | Status: AC | PRN

## 2023-08-02 MED ORDER — ACETAMINOPHEN 325 MG PO TABS: 325.0000 mg | ORAL_TABLET | ORAL | Status: DC | PRN

## 2023-08-02 MED ORDER — MIDAZOLAM HCL 2 MG/2ML IJ SOLN
2.0000 mg | Freq: Once | INTRAMUSCULAR | Status: AC
  Administered 2023-08-02: 1 mg via INTRAVENOUS
  Filled 2023-08-02: qty 2

## 2023-08-02 MED ORDER — OXYCODONE HCL 5 MG PO TABS: 5.0000 mg | ORAL_TABLET | Freq: Three times a day (TID) | ORAL | 0 refills | Status: AC | PRN

## 2023-08-02 MED ORDER — MELOXICAM 15 MG PO TABS: 15.0000 mg | ORAL_TABLET | Freq: Every day | ORAL | 0 refills | Status: AC | PRN

## 2023-08-02 MED ORDER — MIDAZOLAM HCL 5 MG/5ML IJ SOLN
INTRAMUSCULAR | Status: DC | PRN
Start: 2023-08-02 — End: 2023-08-02
  Administered 2023-08-02: 2 mg via INTRAVENOUS

## 2023-08-02 MED ORDER — FENTANYL CITRATE (PF) 100 MCG/2ML IJ SOLN
INTRAMUSCULAR | Status: DC | PRN
  Administered 2023-08-02: 25 ug via INTRAVENOUS

## 2023-08-02 MED ORDER — LIDOCAINE HCL (PF) 2 % IJ SOLN
INTRAMUSCULAR | Status: DC | PRN
  Administered 2023-08-02: 40 mg via INTRADERMAL

## 2023-08-02 MED ORDER — OXYCODONE HCL 5 MG PO TABS: 5.0000 mg | ORAL_TABLET | Freq: Once | ORAL | Status: DC | PRN

## 2023-08-02 MED ORDER — LIDOCAINE HCL (PF) 2 % IJ SOLN
INTRAMUSCULAR | Status: AC
  Filled 2023-08-02: qty 5

## 2023-08-02 MED ORDER — SODIUM CHLORIDE 0.9 % IR SOLN
Status: DC | PRN
  Administered 2023-08-02: 1000 mL

## 2023-08-02 MED ORDER — ONDANSETRON 4 MG PO TBDP: 4.0000 mg | ORAL_TABLET | Freq: Three times a day (TID) | ORAL | 0 refills | Status: AC | PRN

## 2023-08-02 MED ORDER — BUPIVACAINE-EPINEPHRINE (PF) 0.5% -1:200000 IJ SOLN
INTRAMUSCULAR | Status: DC | PRN
  Administered 2023-08-02: 20 mL via PERINEURAL

## 2023-08-02 MED ORDER — ORAL CARE MOUTH RINSE: 15.0000 mL | Freq: Once | OROMUCOSAL | Status: AC

## 2023-08-02 MED ORDER — ACETAMINOPHEN 160 MG/5ML PO SOLN: 325.0000 mg | ORAL | Status: DC | PRN

## 2023-08-02 MED ORDER — DEXAMETHASONE SODIUM PHOSPHATE 10 MG/ML IJ SOLN
INTRAMUSCULAR | Status: AC
  Filled 2023-08-02: qty 1

## 2023-08-02 MED ORDER — CHLORHEXIDINE GLUCONATE 0.12 % MT SOLN
15.0000 mL | Freq: Once | OROMUCOSAL | Status: AC
  Administered 2023-08-02: 15 mL via OROMUCOSAL

## 2023-08-02 MED ORDER — POVIDONE-IODINE 10 % EX SWAB: 2.0000 | Freq: Once | CUTANEOUS | Status: DC

## 2023-08-02 MED ORDER — ACETAMINOPHEN 500 MG PO TABS
1000.0000 mg | ORAL_TABLET | Freq: Once | ORAL | Status: AC
  Administered 2023-08-02: 1000 mg via ORAL
  Filled 2023-08-02: qty 2

## 2023-08-02 MED ORDER — OXYCODONE HCL 5 MG/5ML PO SOLN: 5.0000 mg | Freq: Once | ORAL | Status: DC | PRN

## 2023-08-02 MED ORDER — ASPIRIN 81 MG PO TBEC: 81.0000 mg | DELAYED_RELEASE_TABLET | Freq: Two times a day (BID) | ORAL | 0 refills | Status: AC

## 2023-08-02 MED ORDER — PROPOFOL 500 MG/50ML IV EMUL
INTRAVENOUS | Status: DC | PRN
  Administered 2023-08-02: 160 ug/kg/min via INTRAVENOUS

## 2023-08-02 SURGICAL SUPPLY — 58 items
BAG COUNTER SPONGE SURGICOUNT (BAG) IMPLANT
BAG ZIPLOCK 12X15 (MISCELLANEOUS) ×1 IMPLANT
BIT DRILL 2.2 SS TIBIAL (BIT) IMPLANT
BNDG ELASTIC 3INX 5YD STR LF (GAUZE/BANDAGES/DRESSINGS) ×1 IMPLANT
BNDG ELASTIC 4INX 5YD STR LF (GAUZE/BANDAGES/DRESSINGS) ×1 IMPLANT
BNDG ESMARK 4X9 LF (GAUZE/BANDAGES/DRESSINGS) ×1 IMPLANT
CORD BIPOLAR FORCEPS 12FT (ELECTRODE) ×1 IMPLANT
COVER SURGICAL LIGHT HANDLE (MISCELLANEOUS) ×1 IMPLANT
CUFF TOURN SGL QUICK 18X4 (TOURNIQUET CUFF) ×1 IMPLANT
CUFF TRNQT CYL 24X4X16.5-23 (TOURNIQUET CUFF) IMPLANT
DRAPE C-ARM 42X120 X-RAY (DRAPES) ×1 IMPLANT
DRAPE IMP U-DRAPE 54X76 (DRAPES) ×2 IMPLANT
DRAPE OEC MINIVIEW 54X84 (DRAPES) ×1 IMPLANT
DRAPE U-SHAPE 47X51 STRL (DRAPES) ×1 IMPLANT
DRSG ADAPTIC 3X8 NADH LF (GAUZE/BANDAGES/DRESSINGS) ×1 IMPLANT
DRSG EMULSION OIL 3X3 NADH (GAUZE/BANDAGES/DRESSINGS) IMPLANT
DURAPREP 26ML APPLICATOR (WOUND CARE) ×1 IMPLANT
ELECT REM PT RETURN 15FT ADLT (MISCELLANEOUS) ×1 IMPLANT
GAUZE PAD ABD 8X10 STRL (GAUZE/BANDAGES/DRESSINGS) ×1 IMPLANT
GAUZE SPONGE 4X4 12PLY STRL (GAUZE/BANDAGES/DRESSINGS) ×1 IMPLANT
GLOVE BIO SURGEON STRL SZ7.5 (GLOVE) ×2 IMPLANT
GLOVE BIOGEL PI IND STRL 7.5 (GLOVE) ×1 IMPLANT
GLOVE BIOGEL PI IND STRL 8 (GLOVE) ×1 IMPLANT
GLOVE ECLIPSE 8.0 STRL XLNG CF (GLOVE) ×1 IMPLANT
GLOVE PI ORTHO PRO STRL 7.5 (GLOVE) ×1 IMPLANT
GOWN STRL REUS W/ TWL LRG LVL3 (GOWN DISPOSABLE) ×1 IMPLANT
K-WIRE FX5X1.6XNS BN SS (WIRE) ×2 IMPLANT
KIT BASIN OR (CUSTOM PROCEDURE TRAY) ×1 IMPLANT
KIT TURNOVER KIT A (KITS) IMPLANT
KWIRE FX5X1.6XNS BN SS (WIRE) IMPLANT
NS IRRIG 1000ML POUR BTL (IV SOLUTION) ×1 IMPLANT
PACK ORTHO EXTREMITY (CUSTOM PROCEDURE TRAY) ×1 IMPLANT
PAD CAST 3X4 CTTN HI CHSV (CAST SUPPLIES) ×1 IMPLANT
PAD CAST 4YDX4 CTTN HI CHSV (CAST SUPPLIES) ×1 IMPLANT
PADDING CAST SYNTHETIC 3X4 NS (CAST SUPPLIES) IMPLANT
PEG LOCKING SMOOTH 2.2X16 (Screw) IMPLANT
PEG LOCKING SMOOTH 2.2X20 (Screw) IMPLANT
PENCIL SMOKE EVACUATOR (MISCELLANEOUS) IMPLANT
PLATE STANDARD DVR LEFT (Plate) ×1 IMPLANT
PLATE STD DVR LEFT (Plate) ×1 IMPLANT
PLATE STD DVR LT 24X51 (Plate) IMPLANT
PROTECTOR NERVE ULNAR (MISCELLANEOUS) ×1 IMPLANT
SCREW BN 14X2.7XNONLOCK 3 LD (Screw) IMPLANT
SCREW LP NL 2.7X15MM (Screw) IMPLANT
SCREW LP NL 2.7X16MM (Screw) IMPLANT
SLING ARM FOAM STRAP MED (SOFTGOODS) IMPLANT
SPLINT PLASTER CAST XFAST 4X15 (CAST SUPPLIES) IMPLANT
STRIP CLOSURE SKIN 1/2X4 (GAUZE/BANDAGES/DRESSINGS) ×1 IMPLANT
SUCTION TUBE FRAZIER 10FR DISP (SUCTIONS) IMPLANT
SUT ETHILON 3 0 PS 1 (SUTURE) IMPLANT
SUT ETHILON 4 0 PS 2 18 (SUTURE) ×2 IMPLANT
SUT MNCRL AB 4-0 PS2 18 (SUTURE) ×1 IMPLANT
SUT VIC AB 0 CT1 27XBRD ANTBC (SUTURE) ×2 IMPLANT
SUT VIC AB 2-0 CT1 TAPERPNT 27 (SUTURE) ×1 IMPLANT
TOWEL OR 17X26 10 PK STRL BLUE (TOWEL DISPOSABLE) ×2 IMPLANT
TUBING CONNECTING 10 (TUBING) IMPLANT
UNDERPAD 30X36 HEAVY ABSORB (UNDERPADS AND DIAPERS) ×1 IMPLANT
WATER STERILE IRR 1000ML POUR (IV SOLUTION) ×1 IMPLANT

## 2023-08-02 NOTE — Anesthesia Postprocedure Evaluation (Signed)
 Anesthesia Post Note  Patient: [name]  Procedure(s) Performed: OPEN REDUCTION INTERNAL FIXATION (ORIF) DISTAL RADIUS FRACTURE (Left)     Patient location during evaluation: PACU Anesthesia Type: Regional Level of consciousness: awake and alert Pain management: pain level controlled Vital Signs Assessment: post-procedure vital signs reviewed and stable Respiratory status: spontaneous breathing, nonlabored ventilation, respiratory function stable and patient connected to nasal cannula oxygen Cardiovascular status: stable and blood pressure returned to baseline Postop Assessment: no apparent nausea or vomiting Anesthetic complications: no  No notable events documented.  Last Vitals:  Vitals:   08/02/23 1345 08/02/23 1402  BP: 122/81 (!) 134/90  Pulse: 89 89  Resp: 16 10  Temp:  36.9 C  SpO2: 94% 92%    Last Pain:  Vitals:   08/02/23 1402  TempSrc:   PainSc: 0-No pain                 Shelton Silvas

## 2023-08-02 NOTE — Anesthesia Procedure Notes (Signed)
 Anesthesia Regional Block: Supraclavicular block   Pre-Anesthetic Checklist: , timeout performed,  Correct Patient, Correct Site, Correct Laterality,  Correct Procedure, Correct Position, site marked,  Risks and benefits discussed,  Surgical consent,  Pre-op evaluation,  At surgeon's request and post-op pain management  Laterality: Left  Prep: chloraprep       Needles:  Injection technique: Single-shot  Needle Type: Echogenic Stimulator Needle     Needle Length: 9cm  Needle Gauge: 21     Additional Needles:   Procedures:,,,, ultrasound used (permanent image in chart),,    Narrative:  Start time: 08/02/2023 11:15 AM End time: 08/02/2023 11:20 AM Injection made incrementally with aspirations every 5 mL.  Performed by: Personally  Anesthesiologist: Shelton Silvas, MD  Additional Notes: Discussed risks and benefits of the nerve block in detail, including but not limited vascular injury, permanent nerve damage and infection.   Patient tolerated the procedure well. Local anesthetic introduced in an incremental fashion under minimal resistance after negative aspirations. No paresthesias were elicited during injection. After completion of the procedure, no acute issues were identified and patient continued to be monitored by RN.   Pt complained of neck pain prior to, during and after the procedure until neck returned to the neutral position.

## 2023-08-02 NOTE — Interval H&P Note (Signed)
 History and Physical Interval Note:  08/02/2023 10:15 AM  [name]  has presented today for surgery, with the diagnosis of LEFT WRIST FRACTURE.  The various methods of treatment have been discussed with the patient and family. After consideration of risks, benefits and other options for treatment, the patient has consented to  Procedure(s): OPEN REDUCTION INTERNAL FIXATION (ORIF) DISTAL RADIUS FRACTURE (Left) as a surgical intervention.  The patient's history has been reviewed, patient examined, no change in status, stable for surgery.  I have reviewed the patient's chart and labs.  Questions were answered to the patient's satisfaction.     Sheral Apley

## 2023-08-02 NOTE — Op Note (Signed)
 08/02/2023  12:57 PM  PATIENT:  [name]    PRE-OPERATIVE DIAGNOSIS:  LEFT WRIST FRACTURE  POST-OPERATIVE DIAGNOSIS:  Same  PROCEDURE:  OPEN REDUCTION INTERNAL FIXATION (ORIF) DISTAL RADIUS FRACTURE  SURGEON:  Sheral Apley, MD  ASSISTANT: Levester Fresh, PA-C, he was present and scrubbed throughout the case, critical for completion in a timely fashion, and for retraction, instrumentation, and closure.   ANESTHESIA:   blcok/mac  PREOPERATIVE INDICATIONS:  [name] is a  63 y.o. female with a diagnosis of LEFT WRIST FRACTURE who failed conservative measures and elected for surgical management.    The risks benefits and alternatives were discussed with the patient preoperatively including but not limited to the risks of infection, bleeding, nerve injury, cardiopulmonary complications, the need for revision surgery, among others, and the patient was willing to proceed.  OPERATIVE IMPLANTS: DVR plate  OPERATIVE FINDINGS: unstable fx  BLOOD LOSS: min  COMPLICATIONS: none  TOURNIQUET TIME:  OPERATIVE PROCEDURE:  Patient was identified in the preoperative holding area and site was marked by me She was transported to the operating theater and placed on the table in supine position taking care to pad all bony prominences. After a preincinduction time out anesthesia was induced. The left upper extremity was prepped and draped in normal sterile fashion and a pre-incision timeout was performed. She received ancef for preoperative antibiotics.   I made a 5 cm incision centered over her FCR tendon and dissected down carefully to the level of the flexor tendon sheath and incise this longitudinally and retracted the FCR radially and incised the dorsal aspect of the sheath.   I bluntly dissected the FPL muscle belly away from the brachioradialis and then sharply incised the pronator tendon from the distal radius and from the wrist capsule. I Elevated this off the bone the fractures  visible.   I released the brachioradialis from its insertion. I then debrided the fracture and performed a manual reduction. There were at least 3 articular pieces of the fracture.  I selected a plate and I placed it on the bone. I pinned it into place and was happy on multiple radiographic views with it's placement. I then fixed the plate distally with the locking pegs. I confirmed no articular penetration with the pegs and that none were prominent dorsally.   I then reduced the plate to the shaft improving the volar and radial tilt of her distal radius.  I was happy with the final fluoro xrays. I reviewed more than 3 views of the wrist including obliques and ap/lat   I thoroughly irrigated the wound and closed the pronator over top of the plate and then closed the skin in layers with absorbable stitch. Sterile dressing was applied using the PACU in stable condition.  POST OPERATIVE PLAN: NWB, Splint full time. Ambulate for DVT px.

## 2023-08-02 NOTE — Anesthesia Preprocedure Evaluation (Addendum)
 Anesthesia Evaluation  Patient identified by MRN, date of birth, ID band Patient awake    Reviewed: Allergy & Precautions, NPO status , Patient's Chart, lab work & pertinent test results  Airway Mallampati: I       Dental  (+) Teeth Intact, Dental Advisory Given, Poor Dentition, Missing   Pulmonary Current Smoker and Patient abstained from smoking.    + decreased breath sounds  rales    Cardiovascular negative cardio ROS  Rhythm:Regular Rate:Normal     Neuro/Psych  PSYCHIATRIC DISORDERS Anxiety  Bipolar Disorder    Neuromuscular disease    GI/Hepatic Neg liver ROS,GERD  Medicated,,  Endo/Other  Hypothyroidism    Renal/GU negative Renal ROS     Musculoskeletal  (+) Arthritis , Rheumatoid disorders,  Fibromyalgia -  Abdominal   Peds  Hematology  (+) Blood dyscrasia, anemia   Anesthesia Other Findings   Reproductive/Obstetrics                             Anesthesia Physical Anesthesia Plan  ASA: 3  Anesthesia Plan: Regional   Post-op Pain Management:    Induction: Intravenous  PONV Risk Score and Plan: 2 and Ondansetron, Dexamethasone and Propofol infusion  Airway Management Planned: Natural Airway and Nasal Cannula  Additional Equipment: None  Intra-op Plan:   Post-operative Plan:   Informed Consent: I have reviewed the patients History and Physical, chart, labs and discussed the procedure including the risks, benefits and alternatives for the proposed anesthesia with the patient or authorized representative who has indicated his/her understanding and acceptance.       Plan Discussed with: CRNA  Anesthesia Plan Comments:        Anesthesia Quick Evaluation

## 2023-08-02 NOTE — Transfer of Care (Signed)
 Immediate Anesthesia Transfer of Care Note  Patient: [name]  Procedure(s) Performed: OPEN REDUCTION INTERNAL FIXATION (ORIF) DISTAL RADIUS FRACTURE (Left)  Patient Location: PACU  Anesthesia Type:MAC and Regional  Level of Consciousness: awake and alert   Airway & Oxygen Therapy: Patient Spontanous Breathing  Post-op Assessment: Report given to RN  Post vital signs: Reviewed and stable  Last Vitals:  Vitals Value Taken Time  BP 112/76 08/02/23 1320  Temp    Pulse 95 08/02/23 1324  Resp 22 08/02/23 1324  SpO2 96 % 08/02/23 1324  Vitals shown include unfiled device data.  Last Pain:  Vitals:   08/02/23 0950  TempSrc:   PainSc: 10-Worst pain ever         Complications: No notable events documented.

## 2023-08-02 NOTE — Interval H&P Note (Signed)
 History and Physical Interval Note:  08/02/2023 8:44 AM  [name]  has presented today for surgery, with the diagnosis of LEFT WRIST FRACTURE.  The various methods of treatment have been discussed with the patient and family. After consideration of risks, benefits and other options for treatment, the patient has consented to  Procedure(s): OPEN REDUCTION INTERNAL FIXATION (ORIF) DISTAL RADIUS FRACTURE (Left) as a surgical intervention.  The patient's history has been reviewed, patient examined, no change in status, stable for surgery.  I have reviewed the patient's chart and labs.  Questions were answered to the patient's satisfaction.     Sheral Apley

## 2023-08-02 NOTE — Discharge Instructions (Addendum)

## 2023-08-03 ENCOUNTER — Encounter (HOSPITAL_COMMUNITY): Payer: Self-pay | Admitting: Orthopedic Surgery
